# Patient Record
Sex: Female | Born: 1950 | Race: White | Hispanic: No | Marital: Married | Smoking: Former smoker
Health system: Southern US, Community
[De-identification: ages and names within clinical notes are randomized; demographics above are authoritative.]

## PROBLEM LIST (undated history)

## (undated) DIAGNOSIS — R112 Nausea with vomiting, unspecified: Secondary | ICD-10-CM

## (undated) DIAGNOSIS — I639 Cerebral infarction, unspecified: Secondary | ICD-10-CM

## (undated) DIAGNOSIS — T4145XA Adverse effect of unspecified anesthetic, initial encounter: Secondary | ICD-10-CM

## (undated) DIAGNOSIS — M5030 Other cervical disc degeneration, unspecified cervical region: Secondary | ICD-10-CM

## (undated) DIAGNOSIS — I200 Unstable angina: Secondary | ICD-10-CM

## (undated) DIAGNOSIS — Z9889 Other specified postprocedural states: Secondary | ICD-10-CM

## (undated) DIAGNOSIS — I219 Acute myocardial infarction, unspecified: Secondary | ICD-10-CM

## (undated) DIAGNOSIS — G8929 Other chronic pain: Secondary | ICD-10-CM

## (undated) DIAGNOSIS — E039 Hypothyroidism, unspecified: Secondary | ICD-10-CM

## (undated) DIAGNOSIS — T8859XA Other complications of anesthesia, initial encounter: Secondary | ICD-10-CM

## (undated) HISTORY — DX: Other cervical disc degeneration, unspecified cervical region: M50.30

## (undated) HISTORY — PX: OTHER SURGICAL HISTORY: SHX169

## (undated) HISTORY — DX: Unstable angina: I20.0

## (undated) HISTORY — DX: Hypothyroidism, unspecified: E03.9

## (undated) HISTORY — DX: Other chronic pain: G89.29

## (undated) HISTORY — DX: Cerebral infarction, unspecified: I63.9

## (undated) HISTORY — DX: Acute myocardial infarction, unspecified: I21.9

---

## 1997-08-03 DIAGNOSIS — I639 Cerebral infarction, unspecified: Secondary | ICD-10-CM

## 1997-08-03 HISTORY — DX: Cerebral infarction, unspecified: I63.9

## 1999-05-15 ENCOUNTER — Encounter: Admission: RE | Admit: 1999-05-15 | Discharge: 1999-05-15 | Payer: Self-pay | Admitting: Unknown Physician Specialty

## 2000-06-10 ENCOUNTER — Encounter: Payer: Self-pay | Admitting: Unknown Physician Specialty

## 2000-06-10 ENCOUNTER — Encounter: Admission: RE | Admit: 2000-06-10 | Discharge: 2000-06-10 | Payer: Self-pay | Admitting: Unknown Physician Specialty

## 2001-07-06 ENCOUNTER — Encounter: Payer: Self-pay | Admitting: Unknown Physician Specialty

## 2001-07-06 ENCOUNTER — Encounter: Admission: RE | Admit: 2001-07-06 | Discharge: 2001-07-06 | Payer: Self-pay | Admitting: Unknown Physician Specialty

## 2002-08-15 ENCOUNTER — Encounter: Admission: RE | Admit: 2002-08-15 | Discharge: 2002-08-15 | Payer: Self-pay | Admitting: Unknown Physician Specialty

## 2002-08-15 ENCOUNTER — Encounter: Payer: Self-pay | Admitting: Unknown Physician Specialty

## 2003-09-25 ENCOUNTER — Encounter: Admission: RE | Admit: 2003-09-25 | Discharge: 2003-09-25 | Payer: Self-pay | Admitting: Unknown Physician Specialty

## 2004-09-25 ENCOUNTER — Encounter: Admission: RE | Admit: 2004-09-25 | Discharge: 2004-09-25 | Payer: Self-pay | Admitting: Unknown Physician Specialty

## 2004-10-01 HISTORY — PX: ENDOMETRIAL ABLATION: SHX621

## 2005-08-03 DIAGNOSIS — I219 Acute myocardial infarction, unspecified: Secondary | ICD-10-CM

## 2005-08-03 HISTORY — DX: Acute myocardial infarction, unspecified: I21.9

## 2005-08-24 ENCOUNTER — Ambulatory Visit: Payer: Self-pay | Admitting: Cardiology

## 2005-08-24 ENCOUNTER — Inpatient Hospital Stay (HOSPITAL_COMMUNITY): Admission: EM | Admit: 2005-08-24 | Discharge: 2005-08-27 | Payer: Self-pay | Admitting: Cardiology

## 2005-09-09 ENCOUNTER — Ambulatory Visit: Payer: Self-pay | Admitting: Cardiology

## 2005-09-21 ENCOUNTER — Ambulatory Visit: Payer: Self-pay | Admitting: Internal Medicine

## 2005-09-21 ENCOUNTER — Inpatient Hospital Stay (HOSPITAL_COMMUNITY): Admission: EM | Admit: 2005-09-21 | Discharge: 2005-09-23 | Payer: Self-pay | Admitting: Emergency Medicine

## 2005-09-30 ENCOUNTER — Encounter: Admission: RE | Admit: 2005-09-30 | Discharge: 2005-09-30 | Payer: Self-pay | Admitting: Unknown Physician Specialty

## 2005-10-08 ENCOUNTER — Ambulatory Visit: Payer: Self-pay | Admitting: Cardiology

## 2006-02-18 ENCOUNTER — Ambulatory Visit: Payer: Self-pay | Admitting: Cardiology

## 2006-08-26 ENCOUNTER — Ambulatory Visit: Payer: Self-pay | Admitting: Cardiology

## 2006-09-27 ENCOUNTER — Inpatient Hospital Stay (HOSPITAL_COMMUNITY): Admission: EM | Admit: 2006-09-27 | Discharge: 2006-09-27 | Payer: Self-pay | Admitting: Emergency Medicine

## 2006-09-27 ENCOUNTER — Ambulatory Visit: Payer: Self-pay | Admitting: Internal Medicine

## 2006-10-01 ENCOUNTER — Encounter: Admission: RE | Admit: 2006-10-01 | Discharge: 2006-10-01 | Payer: Self-pay | Admitting: Unknown Physician Specialty

## 2006-10-11 ENCOUNTER — Ambulatory Visit: Payer: Self-pay | Admitting: Cardiology

## 2007-04-27 ENCOUNTER — Ambulatory Visit: Payer: Self-pay | Admitting: Cardiology

## 2007-06-23 ENCOUNTER — Ambulatory Visit: Payer: Self-pay | Admitting: Cardiology

## 2007-08-16 ENCOUNTER — Ambulatory Visit: Payer: Self-pay | Admitting: Cardiology

## 2007-08-19 ENCOUNTER — Ambulatory Visit: Payer: Self-pay | Admitting: Internal Medicine

## 2007-09-21 ENCOUNTER — Ambulatory Visit: Payer: Self-pay | Admitting: Cardiology

## 2007-10-07 ENCOUNTER — Encounter: Admission: RE | Admit: 2007-10-07 | Discharge: 2007-10-07 | Payer: Self-pay | Admitting: Unknown Physician Specialty

## 2007-10-20 ENCOUNTER — Encounter: Admission: RE | Admit: 2007-10-20 | Discharge: 2007-10-20 | Payer: Self-pay | Admitting: Unknown Physician Specialty

## 2007-12-21 ENCOUNTER — Ambulatory Visit: Payer: Self-pay | Admitting: Internal Medicine

## 2008-02-08 ENCOUNTER — Ambulatory Visit: Payer: Self-pay | Admitting: Cardiology

## 2008-04-25 ENCOUNTER — Ambulatory Visit: Payer: Self-pay | Admitting: Cardiology

## 2008-08-07 ENCOUNTER — Ambulatory Visit: Payer: Self-pay | Admitting: Cardiology

## 2008-08-29 ENCOUNTER — Ambulatory Visit: Payer: Self-pay | Admitting: Cardiology

## 2008-10-08 ENCOUNTER — Encounter: Admission: RE | Admit: 2008-10-08 | Discharge: 2008-10-08 | Payer: Self-pay | Admitting: Unknown Physician Specialty

## 2008-12-19 ENCOUNTER — Encounter (INDEPENDENT_AMBULATORY_CARE_PROVIDER_SITE_OTHER): Payer: Self-pay | Admitting: *Deleted

## 2009-01-01 DIAGNOSIS — E039 Hypothyroidism, unspecified: Secondary | ICD-10-CM | POA: Insufficient documentation

## 2009-01-01 DIAGNOSIS — I6350 Cerebral infarction due to unspecified occlusion or stenosis of unspecified cerebral artery: Secondary | ICD-10-CM | POA: Insufficient documentation

## 2009-01-02 ENCOUNTER — Ambulatory Visit: Payer: Self-pay | Admitting: Cardiology

## 2009-01-09 ENCOUNTER — Encounter: Payer: Self-pay | Admitting: Cardiology

## 2009-02-22 DIAGNOSIS — I200 Unstable angina: Secondary | ICD-10-CM | POA: Insufficient documentation

## 2009-03-01 ENCOUNTER — Ambulatory Visit: Payer: Self-pay | Admitting: Cardiology

## 2009-04-25 ENCOUNTER — Ambulatory Visit: Payer: Self-pay | Admitting: Cardiology

## 2009-07-05 ENCOUNTER — Encounter (INDEPENDENT_AMBULATORY_CARE_PROVIDER_SITE_OTHER): Payer: Self-pay | Admitting: *Deleted

## 2009-07-16 ENCOUNTER — Telehealth (INDEPENDENT_AMBULATORY_CARE_PROVIDER_SITE_OTHER): Payer: Self-pay | Admitting: *Deleted

## 2009-07-17 ENCOUNTER — Encounter (INDEPENDENT_AMBULATORY_CARE_PROVIDER_SITE_OTHER): Payer: Self-pay | Admitting: *Deleted

## 2009-07-23 ENCOUNTER — Encounter (INDEPENDENT_AMBULATORY_CARE_PROVIDER_SITE_OTHER): Payer: Self-pay | Admitting: *Deleted

## 2009-09-10 ENCOUNTER — Ambulatory Visit: Payer: Self-pay | Admitting: Cardiology

## 2009-09-10 ENCOUNTER — Telehealth: Payer: Self-pay | Admitting: Cardiology

## 2009-09-10 DIAGNOSIS — E785 Hyperlipidemia, unspecified: Secondary | ICD-10-CM | POA: Insufficient documentation

## 2009-09-10 DIAGNOSIS — I2510 Atherosclerotic heart disease of native coronary artery without angina pectoris: Secondary | ICD-10-CM | POA: Insufficient documentation

## 2009-09-24 ENCOUNTER — Encounter: Payer: Self-pay | Admitting: Cardiology

## 2009-09-26 ENCOUNTER — Telehealth: Payer: Self-pay | Admitting: Cardiology

## 2009-10-10 ENCOUNTER — Telehealth: Payer: Self-pay | Admitting: Cardiology

## 2009-10-22 ENCOUNTER — Encounter: Admission: RE | Admit: 2009-10-22 | Discharge: 2009-10-22 | Payer: Self-pay | Admitting: Unknown Physician Specialty

## 2009-10-29 ENCOUNTER — Encounter: Payer: Self-pay | Admitting: Cardiology

## 2009-12-12 ENCOUNTER — Telehealth: Payer: Self-pay | Admitting: Cardiology

## 2010-01-15 ENCOUNTER — Telehealth: Payer: Self-pay | Admitting: Cardiology

## 2010-01-20 ENCOUNTER — Telehealth: Payer: Self-pay | Admitting: Cardiology

## 2010-04-11 ENCOUNTER — Ambulatory Visit: Payer: Self-pay | Admitting: Cardiology

## 2010-04-16 ENCOUNTER — Encounter: Payer: Self-pay | Admitting: Cardiology

## 2010-04-16 ENCOUNTER — Ambulatory Visit (HOSPITAL_COMMUNITY): Admission: RE | Admit: 2010-04-16 | Discharge: 2010-04-16 | Payer: Self-pay | Admitting: Cardiology

## 2010-04-16 ENCOUNTER — Ambulatory Visit: Payer: Self-pay | Admitting: Cardiovascular Disease

## 2010-08-23 ENCOUNTER — Encounter: Payer: Self-pay | Admitting: Neurology

## 2010-08-24 ENCOUNTER — Encounter: Payer: Self-pay | Admitting: Unknown Physician Specialty

## 2010-08-31 LAB — CONVERTED CEMR LAB
Calcium: 9.2 mg/dL (ref 8.4–10.5)
Chloride: 108 meq/L (ref 96–112)
Creatinine, Ser: 0.7 mg/dL (ref 0.4–1.2)
GFR calc non Af Amer: 91.48 mL/min (ref >60)
HDL: 55.7 mg/dL (ref >39.00)
Total Bilirubin: 0.5 mg/dL (ref 0.3–1.2)
Total CHOL/HDL Ratio: 3
Total Protein: 7.1 g/dL (ref 6.0–8.3)

## 2010-09-02 NOTE — Medication Information (Signed)
Summary: [facility]   Imported By: Earl Many 11/09/2009 10:43:41  _____________________________________________________________________  External Attachment:    Type:   Image     Comment:   External Document

## 2010-09-02 NOTE — Progress Notes (Signed)
Summary: pt needs prior approval called in   Phone Note Refill Request Call back at Home Phone [phone] Call back at husband cell# [phone] Message from:  Patient on [facility]  Refills Requested: Medication #1:  PROTONIX 40 MG TBEC 1 tab once daily pt needs prior approval called to [phone] pt acct# [id number]  Initial call taken by: Omer Jack,  January 20, 2010 10:27 AM  Follow-up for Phone Call        CMA s/w [facility] today and got aproval for pt's Protonix good for 1 yr. Danielle Rankin, CMA  January 20, 2010 11:22 AM

## 2010-09-02 NOTE — Assessment & Plan Note (Signed)
Summary: 6 mo f/u ./cy  Medications Added LEVOTHYROXINE SODIUM 88 MCG TABS (LEVOTHYROXINE SODIUM) 1 tab once daily TRAMADOL HCL 50 MG TABS (TRAMADOL HCL) 1 tab two times a day AMITRIPTYLINE HCL 50 MG TABS (AMITRIPTYLINE HCL) 1 tab at bedtime        Visit Type:  6 mo f/u Primary Provider:  Selinda Flavin  CC:  pt states sometimes when she breaths in her chest feels iritated but states she is on an antibiotic for sinusitis....edema/hands...denies any other complaints today.  History of Present Illness: Mrs. Gondek returns today for followup of her history of coronary artery disease and history of a CVA.  She's having no angina or ischemic symptoms except on rare occasion. She then takes her Ranexa p.r.n. which I've advised against the past.  She recently was at [facility] receiving Botox injections for her residual stroke fracture. She was seen by neurologist and I read the note today. She recommended that she come off of aspirin and take Plavix 75 mg twice a day. I have asked her to stop her aspirin in the past that she stay on it.      Current Medications (verified): 1)  Lyrica 75 Mg Caps (Pregabalin) .Marland Kitchen.. 1 Cap Once Daily 2)  Aggrenox 25-200 Mg Xr12h-Cap (Aspirin-Dipyridamole) .... Two Times A Day 3)  Fluoxetine Hcl 20 Mg Caps (Fluoxetine Hcl) .... Once Daily 4)  Plavix 75 Mg Tabs (Clopidogrel Bisulfate) .... Once Daily 5)  Alprazolam 0.5 Mg Tabs (Alprazolam) .... Once Daily 6)  Miralax  Powd (Polyethylene Glycol 3350) .... Daily 7)  Aspirin 81 Mg Tbec (Aspirin) .... Take One Tablet By Mouth Daily 8)  Zyrtec Hives Relief 10 Mg Tabs (Cetirizine Hcl) .... Once Daily 9)  Benadryl 25 Mg Caps (Diphenhydramine Hcl) .... As Needed 10)  Nitroglycerin 0.4 Mg Subl (Nitroglycerin) .... One Tablet Under Tongue Every 5 Minutes As Needed For Chest Pain---May Repeat Times Three 11)  Ranexa 500 Mg Xr12h-Tab (Ranolazine) .... As Needed 12)  Protonix 40 Mg Tbec (Pantoprazole Sodium) .Marland Kitchen.. 1  Tab Once Daily 13)  Simvastatin 40 Mg Tabs (Simvastatin) .Marland Kitchen.. 1 Once Daily 14)  Levothyroxine Sodium 88 Mcg Tabs (Levothyroxine Sodium) .Marland Kitchen.. 1 Tab Once Daily 15)  Tramadol Hcl 50 Mg Tabs (Tramadol Hcl) .Marland Kitchen.. 1 Tab Two Times A Day 16)  Amitriptyline Hcl 50 Mg Tabs (Amitriptyline Hcl) .Marland Kitchen.. 1 Tab At Bedtime  Allergies: 1)  ! Penicillin 2)  ! Morphine 3)  ! 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 Father is 48 with no CAD.  There is no CAD in her siblings.  Social History: Last updated: 02/28/2009 Disabled  Married  Tobacco Use - No.  Alcohol Use - no Drug Use - no  Risk Factors: Smoking Status: never (February 28, 2009)  Review of Systems       negative for bleeding or melena  Vital Signs:  Patient profile:   60 year old female Height:      62 inches Weight:      147 pounds BMI:     26.98 Pulse rate:   62 / minute Pulse rhythm:   irregular BP sitting:   118 / 66  (right arm) Cuff size:   large  Vitals Entered By: Okey Regal  Denyse Amass, CMA (April 11, 2010 2:38 PM)  Physical Exam  General:  well-developed in no acute distress Head:  left facial droop Eyes:  PERRLA/EOM intact; conjunctiva and lids normal. Neck:  Neck supple, no JVD. No masses, thyromegaly or abnormal cervical nodes. Chest Quinnten Calvin:  no deformities or breast masses noted Lungs:  Clear bilaterally to auscultation and percussion. Heart:  PMI nondisplaced regular rate and rhythm normal S1-S2, no carotid bruits Msk:  decreased ROM Pulses:  pulses normal in all 4 extremities Extremities:  No clubbing or cyanosis. Neurologic:  Baseline, alert and oriented x3 Skin:  Intact  without lesions or rashes. Psych:  Normal affect.   New Orders:     1)  Echocardiogram (Echo)   Impression & Recommendations:  Problem # 1:  CAD, NATIVE VESSEL (ICD-414.01) Assessment Unchanged I have asked to discontinue the aspirin. She'll get 50 mg of aspirin to Aggrenox I think is a better drug for neurology for stroke prevention. She'll continue with Plavix 75 mg a day. I'm not aware of 75 mg twice a day as being any more efficacious. I have discussed this with Dr. Excell Seltzer who also agrees with this plan. The following medications were removed from the medication list:    Aspirin 81 Mg Tbec (Aspirin) .Marland Kitchen... Take one tablet by mouth daily Her updated medication list for this problem includes:    Aggrenox 25-200 Mg Xr12h-cap (Aspirin-dipyridamole) .Marland Kitchen..Marland Kitchen Two times a day    Plavix 75 Mg Tabs (Clopidogrel bisulfate) ..... Once daily    Nitroglycerin 0.4 Mg Subl (Nitroglycerin) ..... One tablet under tongue every 5 minutes as needed for chest pain---may repeat times three    Ranexa 500 Mg Xr12h-tab (Ranolazine) .Marland Kitchen... As needed  Problem # 2:  CVA (ICD-434.91) Assessment: Unchanged I will arrange an echocardiogram to rule out remote possibility of a patent foramen ovale. The following medications were removed from the medication list:    Aspirin 81 Mg Tbec (Aspirin) .Marland Kitchen... Take one tablet by mouth daily Her updated medication list for this problem includes:    Aggrenox 25-200 Mg Xr12h-cap (Aspirin-dipyridamole) .Marland Kitchen..Marland Kitchen Two times a day    Plavix 75 Mg Tabs (Clopidogrel bisulfate) ..... Once daily  Orders: Echocardiogram (Echo)  Problem # 3:  HYPERLIPIDEMIA-MIXED (ICD-272.4)  Her updated medication list for this problem includes:    Simvastatin 40 Mg Tabs (Simvastatin) .Marland Kitchen... 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 Take one tablet by mouth daily Her updated medication list for this problem includes:    Aggrenox 25-200 Mg Xr12h-cap (Aspirin-dipyridamole) .Marland Kitchen..Marland Kitchen Two times a day    Plavix 75 Mg Tabs (Clopidogrel bisulfate) ..... Once daily    Nitroglycerin 0.4 Mg Subl (Nitroglycerin) ..... One tablet under tongue every 5 minutes as needed for chest pain---may repeat times three    Ranexa 500 Mg Xr12h-tab (Ranolazine) .Marland Kitchen... As needed  Clinical Reports Reviewed:  Cardiac Cath:  09/27/2006: Cardiac Cath Findings:  IMPRESSION/RECOMMENDATIONS:  She has no significant coronary stenoses.  Specifically, the LAD has recanalized.  Her EF is improved.  I suspect a  noncardiac etiology to her chest discomfort.  Further evaluation if it  recurs.  08/25/2005: Cardiac Cath Findings:  The overall Ephriam Turman motion was good with an estimated ejection  fraction of 60%.   Following attempt at PCA  there was persistent 99% stenosis in the distal LAD  with TIMI-I flow.   CONCLUSION:  1.  Coronary artery disease, status post recent non-ST-elevation myocardial      infarction with 99% stenosis in the distal left anterior descending with      TIMI-I flow, no significant obstruction in the circumflex and right      coronary arteries, and inferoapical Adrain Nesbit akinesis.  2.  Unsuccessful percutaneous coronary intervention of the lesion in the      distal left anterior descending due to inability to cross with the wire.   DISPOSITION:  The patient returned to the postanesthesia care unit for  further observation. I think her outlook should be good on medical therapy.  She has no significant residual coronary disease and overall LV function is  good.         ______________________________  Charlies Constable, M.D. [facility]   Patient Instructions: 1)  Your physician recommends that you schedule a follow-up appointment in: 6 months with Dr. Daleen Squibb 2)  Your physician has  recommended you make the following change in your medication: STOP Aspirin.   Decrease Simvastatin to 1/2 tablet while taking the z-pack.  Appended Document: 6 mo f/u ./cy    Clinical Lists Changes  Observations: Added new observation of PI CARDIO: Your physician has requested that you have an echocardiogram.  Echocardiography is a painless test that uses sound waves to create images of your heart. It provides your doctor with information about the size and shape of your heart and how well your heart's chambers and valves are working.  This procedure takes approximately one hour. There are no restrictions for this procedure. PT WOULD LIKE TO HAVE THIS DONE IN [place] (04/11/2010 17:50)       Patient Instructions: 1)  Your physician has requested that you have an echocardiogram.  Echocardiography is a painless test that uses sound waves to create images of your heart. It provides your doctor with information about the size and shape of your heart and how well your heart's chambers and valves are working.  This procedure takes approximately one hour. There are no restrictions for this procedure. PT WOULD LIKE TO HAVE THIS DONE IN [place]

## 2010-09-02 NOTE — Progress Notes (Signed)
Summary: med question   Phone Note Call from Patient Call back at Home Phone [phone]   Caller: Patient Reason for Call: Talk to Nurse Summary of Call: request to speak to nurse about meds (the study she is in, is changing some meds) Initial call taken by: Migdalia Dk,  October 10, 2009 8:44 AM  Follow-up for Phone Call        SPOKE WITH PT  MD WITH STUDY WANTS PT  TO GET 2ND OPINION RE PT MAYBE ON TO MANY BLOOD THINNERS  HAS APPT WITH DR Cherre Blanc. END ON MARCH  JUST WANTING TO LET YOU KNOW. Follow-up by: Scherrie Bateman, LPN,  October 10, 2009 8:53 AM  Additional Follow-up for Phone Call Additional follow up Details #1::        My humble opinion would be to discontinue Aspirin, continue Plavix and Aggrenox. Additional Follow-up by: Gaylord Shih, MD, [facility],  October 10, 2009 8:59 AM     Appended Document: med question PT AWARE./CY

## 2010-09-02 NOTE — Letter (Signed)
Summary: [facility] Medical Office Note  [facility] Medical Office Note   Imported By: Roderic Ovens 10/11/2009 16:22:42  _____________________________________________________________________  External Attachment:    Type:   Image     Comment:   External Document

## 2010-09-02 NOTE — Progress Notes (Signed)
Summary: talk to nurse   Phone Note Call from Patient Call back at Home Phone [phone]   Caller: Patient Reason for Call: Talk to Nurse Summary of Call: pt needs to talk to nurse about her medications.  Initial call taken by: Edman Circle,  Dec 12, 2009 1:15 PM  Follow-up for Phone Call        PER PT IS STILL WAITING ON RENEXA.PT AWARE WILL CALL IN SCRIPT.  Follow-up by: Scherrie Bateman, LPN,  Dec 12, 2009 5:09 PM    Prescriptions: RANEXA 500 MG XR12H-TAB (RANOLAZINE) as needed  #180 x 3   Entered by:   Scherrie Bateman, LPN   Authorized by:   Gaylord Shih, MD, [facility]   Signed by:   Scherrie Bateman, LPN on 38/75/6433   Method used:   Telephoned to ...       [facility]             , [place]         Ph:        Fax: [phone]   RxID:   [id number]

## 2010-09-02 NOTE — Letter (Signed)
Summary: [facility] Medical Office Note  [facility] Medical Office Note   Imported By: Roderic Ovens 11/06/2009 13:41:43  _____________________________________________________________________  External Attachment:    Type:   Image     Comment:   External Document

## 2010-09-02 NOTE — Progress Notes (Signed)
Summary: pharm/med question   Phone Note From Pharmacy Call back at [phone]   Caller: [facility]  [address] Summary of Call: RANEXA 500 MG XR12H-TAB (RANOLAZINE) 500mg  only comes in 60tab, has to be dispensed in orginal container, prescription stated 30 Initial call taken by: Migdalia Dk,  September 10, 2009 4:53 PM  Follow-up for Phone Call        PHARMACISTS NOTIFIED  TO FILL 60 TABS OF RENEXA Follow-up by: Scherrie Bateman, LPN,  September 10, 2009 5:42 PM

## 2010-09-02 NOTE — Progress Notes (Signed)
Summary: Calling baout prior authoriztion for Ranexa   Phone Note Call from Patient Call back at [facility] Phone [phone]   Caller: Patient Summary of Call: Pt calling about her medication Ranexa and the prior authoriztion Initial call taken by: Judie Grieve,  September 26, 2009 11:48 AM  Follow-up for Phone Call        CMA s/w pt and advised that Scherrie Bateman had sent in prior auth already..Advised TW not in office until Tuesday 3/1.Carma Leaven gave verbal understanding. Danielle Rankin, CMA  September 26, 2009 12:11 PM

## 2010-09-02 NOTE — Assessment & Plan Note (Signed)
Summary: 6 month rov/sl    Visit Type:  6 mo f/u Primary Provider:  Selinda Flavin  CC:  pt states she feels good denies any cp....  History of Present Illness: [name] comes in today for evaluation and management of her coronary disease, history of a myocardial infarction, hyperlipidemia, stable angina, history of CVA.  She's had no angina since I last saw her. She has not had to take any of her next which again she only takes p.r.n. Please see previous notes about this.  She is taking Pepcid now to avoid any interaction with Plavix.  She denies any bleeding, symptoms of TIAs or mini strokes.  Current Medications (verified): 1)  Lyrica 75 Mg Caps (Pregabalin) .Marland Kitchen.. 1 Cap Once Daily 2)  Aggrenox 25-200 Mg Xr12h-Cap (Aspirin-Dipyridamole) .... Two Times A Day 3)  Fluoxetine Hcl 20 Mg Caps (Fluoxetine Hcl) .... Once Daily 4)  Plavix 75 Mg Tabs (Clopidogrel Bisulfate) .... Once Daily 5)  Alprazolam 0.5 Mg Tabs (Alprazolam) .... Once Daily 6)  Miralax  Powd (Polyethylene Glycol 3350) .... Daily 7)  Aspirin 81 Mg Tbec (Aspirin) .... Take One Tablet By Mouth Daily 8)  Zyrtec Hives Relief 10 Mg Tabs (Cetirizine Hcl) .... Once Daily 9)  Benadryl 25 Mg Caps (Diphenhydramine Hcl) .... As Needed 10)  Pepcid Ac 10 Mg Tabs (Famotidine) .... As Needed 11)  Nitroglycerin 0.4 Mg Subl (Nitroglycerin) .... One Tablet Under Tongue Every 5 Minutes As Needed For Chest Pain---May Repeat Times Three 12)  Ranexa 500 Mg Xr12h-Tab (Ranolazine) .... As Needed 13)  Protonix 40 Mg Tbec (Pantoprazole Sodium) .Marland Kitchen.. 1 Tab Once Daily 14)  Simvastatin 40 Mg Tabs (Simvastatin) .Marland Kitchen.. 1 Once Daily  Allergies: 1)  ! Penicillin 2)  ! Morphine 3)  ! 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 Father is 54 with no CAD.  There is no CAD in her siblings.  Social History: Last updated: 03-12-09 Disabled  Married  Tobacco Use - No.  Alcohol Use - no Drug Use - no  Risk Factors: Smoking Status: never (03-12-09)  Review of Systems       negative other than history of present illness  Vital Signs:  Patient profile:   60 year old female Height:      62 inches Weight:      146 pounds BMI:     26.80 Pulse rate:   60 / minute Pulse rhythm:   irregular BP sitting:   118 / 70  (right arm) Cuff size:   large  Vitals Entered By: Danielle Rankin, CMA (September 10, 2009 10:18 AM)  Physical Exam  General:  Well developed, well nourished, in no acute distress. Head:  normocephalic and atraumatic Eyes:  PERRLA/EOM intact; conjunctiva and lids normal. Mouth:  Teeth, gums and palate normal. Oral mucosa normal. Neck:  Neck supple, no JVD. No masses, thyromegaly or abnormal cervical nodes. Lungs:  Clear bilaterally to auscultation and percussion. Heart:  Non-displaced PMI, chest non-tender; regular rate and rhythm, S1, S2 without murmurs, rubs or gallops. Carotid upstroke normal, no bruit. Normal abdominal aortic size, no bruits. Femorals normal pulses, no bruits. Pedals normal pulses. No edema, no varicosities. Msk:  left-sided atrophy from previous stroke with left lower  extremity brace intact Pulses:  pulses normal in all 4 extremities Extremities:  trace left pedal edema and trace right pedal edema.   Neurologic:  Baseline, alert oriented, extremely pleasant and funny Skin:  Intact without lesions or rashes. Psych:  Normal affect.   Problems:  Medical Problems Added: 1)  Dx of Hyperlipidemia-mixed  (ICD-272.4) 2)  Dx of Cad, Native Vessel  (ICD-414.01)  EKG  Procedure date:  09/10/2009  Findings:      normal sinus rhythm, no ST segment changes,  stable  Impression & Recommendations:  Problem # 1:  CAD, NATIVE VESSEL (ICD-414.01) Assessment Unchanged  Her updated medication list for this problem includes:    Aggrenox 25-200 Mg Xr12h-cap (Aspirin-dipyridamole) .Marland Kitchen..Marland Kitchen Two times a day    Plavix 75 Mg Tabs (Clopidogrel bisulfate) ..... Once daily    Aspirin 81 Mg Tbec (Aspirin) .Marland Kitchen... Take one tablet by mouth daily    Nitroglycerin 0.4 Mg Subl (Nitroglycerin) ..... One tablet under tongue every 5 minutes as needed for chest pain---may repeat times three    Ranexa 500 Mg Xr12h-tab (Ranolazine) .Marland Kitchen... 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 Once daily    Aspirin 81 Mg Tbec (Aspirin) .Marland Kitchen... Take one tablet by mouth daily    Nitroglycerin 0.4 Mg Subl (Nitroglycerin) ..... One tablet under tongue every 5 minutes as needed for chest pain---may repeat times three    Ranexa 500 Mg Xr12h-tab (Ranolazine) .Marland Kitchen... As needed  Problem # 3:  CVA (ICD-434.91) Assessment: Unchanged  Her updated medication list for this problem includes:    Aggrenox 25-200 Mg Xr12h-cap (Aspirin-dipyridamole) .Marland Kitchen..Marland Kitchen Two times a day    Plavix 75 Mg Tabs (Clopidogrel bisulfate) ..... Once daily    Aspirin 81 Mg Tbec (Aspirin) .Marland Kitchen... Take one tablet by mouth daily  Problem # 4:  HYPOTHYROIDISM (ICD-244.9) Assessment: Unchanged  Problem # 5:  HYPERLIPIDEMIA-MIXED (ICD-272.4)  we'll check labs today. She has fasted. She is now on simvastatin and off the IMPROVE IT trial. Her updated medication list for this problem includes:    Simvastatin 40 Mg Tabs (Simvastatin) .Marland Kitchen... 1 once daily  Orders: TLB-Lipid Panel ([id number]-LIPID)  Other Orders: TLB-Hepatic/Liver Function Pnl (80076-HEPATIC)

## 2010-09-02 NOTE — Progress Notes (Signed)
Summary: prior auth done today   Phone Note Call from Patient Call back at Home Phone [phone]   Caller: Patient Reason for Call: Talk to Nurse Summary of Call: request to speak to nurse about prior approval for protonix Initial call taken by: Migdalia Dk,  January 15, 2010 10:51 AM  Follow-up for Phone Call        CMA [facility] for pt stating office closed now since it is 5:15 PM, CMA stated that she will retry calling pt tomorrow between 8:30-9am about rx. Danielle Rankin, CMA  January 15, 2010 5:19 PM  returning call, [phone], Migdalia Dk  January 16, 2010 8:32 AM   Additional Follow-up for Phone Call Additional follow up Details #1::        CMA [facility] for pt c/b 01/16/10 and 01/17/10. Danielle Rankin, CMA  January 17, 2010 10:19 AM

## 2010-09-23 ENCOUNTER — Other Ambulatory Visit: Payer: Self-pay | Admitting: Unknown Physician Specialty

## 2010-09-23 DIAGNOSIS — Z1231 Encounter for screening mammogram for malignant neoplasm of breast: Secondary | ICD-10-CM

## 2010-10-24 ENCOUNTER — Ambulatory Visit
Admission: RE | Admit: 2010-10-24 | Discharge: 2010-10-24 | Disposition: A | Discharge status: Home / Self Care | Payer: MEDICARE | Source: Ambulatory Visit | Attending: *Deleted | Admitting: *Deleted

## 2010-10-24 DIAGNOSIS — Z1231 Encounter for screening mammogram for malignant neoplasm of breast: Secondary | ICD-10-CM

## 2010-12-16 NOTE — Assessment & Plan Note (Signed)
[facility]                            CARDIOLOGY OFFICE NOTE   CLEON, THOMA                       MRN:          [id number]  DATE:06/23/2007                            DOB:          [birth year]    Ms. Caudill comes back today because of some substernal chest pain.  She  has had this mostly at rest.  She really does not have it with activity.   She has had some increased indigestion.   She has been taking p.r.n. Ranexa on her own 500 mg.  This seems to  relieve it after about 30 minutes.  She has no other symptoms.  She does  not take nitroglycerin because it gives her a significant headache.   She has also been taking Pepcid at the same time as the Ranexa.   Her last catheterization showed basically recannulization of her distal  LAD and the vessels looked normal.  She had no other significant  coronary disease.  Her EF was 65% with apical hypokinesia.   MEDICATIONS:  1. She continues on Lyrica 75 mg b.i.d.  2. Aggrenox 25 mg p.o. b.i.d.  3. Fluoxetine 20 mg a day.  4. Plavix 75 mg a day.  5. Alprazolam 0.5 mg daily.  6. IMPROVE-IT study drug.  7. MiraLax 1 daily.  8. Levothyroxine 50 mcg a day.  9. Enteric-coated aspirin 81 mg a day.  10.Zyrtec daily.   EXAM:  Blood pressure 128/82, pulse 58 and regular.  She is delightful as usual.  She is in no acute distress.  HEENT:  Normocephalic, atraumatic.  PERRLA.  Extraocular muscles are  intact.  Sclerae clear.  Facial symmetry is normal.  Carotid upstrokes are equal bilaterally without bruits.  No JVD.  Thyroid is not enlarged.  Trachea is midline.  LUNGS:  Clear.  HEART:  Reveals a regular rate and rhythm.  No murmur, rub, or gallop.  ABDOMEN:  Soft.  Good bowel sounds.  EXTREMITIES:  No cyanosis, clubbing, but she has trace edema on the left  side, none on the right.  Pulses were present.   Her EKG is normal with low voltage.  There have been no changes.   I suspect most of Ms.  Orellana's symptoms are due to gastroesophageal  reflux.  I am really not elated with her taking Ranexa p.r.n., though it  seems to help.   PLAN:  1. Limit the amount of Ranexa she is taking.  2. Omeprazole 20 mg p.o. daily for 30 days.  3. Continue other medications.  4. See me back in January of 2009.     Thomas C. Daleen Squibb, MD, [facility]  Electronically Signed    TCW/MedQ  DD: 06/23/2007  DT: 06/24/2007  Job #: [id number]   cc:   Selinda Flavin

## 2010-12-16 NOTE — Assessment & Plan Note (Signed)
[facility] HEALTHCARE                            CARDIOLOGY OFFICE NOTE   Dawn, Newton                       MRN:          [id number]  DATE:08/16/2007                            DOB:          1950/12/17    [name] returns today for further management of her chest  discomfort.  We felt like this is mostly reflux and asked her to stop  taking her Ranexa p.r.n.  We put her on omeprazole 20 mg a day, which  has helped dramatically.  Her reflux is resolved, as is her chest pain.  She is not taking any Ranexa.   Her biggest complaint is that her insurance does not pay for the  omeprazole.  She has had it renewed and it cost her like 50 bucks.   We called the [facility] in [place] today but they could not give [place] any  enlightenment about what they would pay for.   Her medications are unchanged otherwise.   Her Blood pressure today is 125/81, her pulse is 61 and regular, weight  is 148.  HEENT:  Unchanged.  Carotid upstrokes are equal bilaterally without bruits, no JVD.  Thyroid  is not enlarged.  LUNGS:  Clear.  HEART:  A regular rate and rhythm.  ABDOMEN:  Exam is unchanged.  EXTREMITIES:  No cyanosis, clubbing or edema.  Pulses are intact.   I am delighted [name] is better.  I have asked her to continue with  omeprazole and to check with her insurance company to which proton pump  inhibitor they will pay for.  I think over-the-counter Pepcid is going  to be inutile.  I have given her samples of pantoprazole to last her a  month as well.  I will see her back in 6 months.     Thomas C. Daleen Squibb, MD, [facility]  Electronically Signed    TCW/MedQ  DD: 08/16/2007  DT: 08/17/2007  Job #: [id number]   cc:   Selinda Flavin

## 2010-12-16 NOTE — Assessment & Plan Note (Signed)
[facility] HEALTHCARE                            CARDIOLOGY OFFICE NOTE   PHILIPPA, VESSEY                       MRN:          [id number]  DATE:08/07/2008                            DOB:          [birth year]    Ms. Rohrman comes in today for followup.  She is in her usual cheerful  spirit and positive outlook.  What an inspiration she is!   She is having no angina.  She has occasional reflux.  She still takes  her Ranexa 500 mg p.r.n. for chest pain.  Because of the cough, she will  not take it on a regular basis.   Her current meds are unchanged since her last visit.  Please refer to  the maintenance medication list.  She prefers Protonix though she and  her husband are aware of the potential interaction with Plavix, mainly  because she says her insurance pays for it.  She rarely has to use  nitroglycerin.   PHYSICAL EXAMINATION:  VITAL SIGNS:  Her blood pressure today is 114/60,  her pulse 52 and regular.  Her weight is 150.  HEENT:  Baseline unchanged.  NECK:  Carotid upstrokes were equal bilaterally without bruits, no JVD.  Thyroid is not enlarged.  Trachea is midline.  LUNGS:  Clear to auscultation and percussion.  HEART:  Regular rate and rhythm that is slow.  No murmur.  ABDOMEN:  Soft, good bowel sounds.  EXTREMITIES:  No cyanosis, clubbing, or edema.  Pulses are intact.  NEUROLOGIC:  Baseline.   EKG shows sinus bradycardia with low voltage.  There has been no change.   ASSESSMENT AND PLAN:  Ms. Rasch continues to do well.  I have made no  changes in her medical program.  We will plan on seeing her back again  in 6 months.  She continues to improve with study which should be over  in a couple of years.     Thomas C. Daleen Squibb, MD, [facility]  Electronically Signed    TCW/MedQ  DD: 08/07/2008  DT: 08/08/2008  Job #: [id number]   cc:   Selinda Flavin

## 2010-12-16 NOTE — Assessment & Plan Note (Signed)
[facility] HEALTHCARE                            CARDIOLOGY OFFICE NOTE   [name], [name]                       MRN:          [id number]  DATE:02/08/2008                            DOB:          1950-11-11    [name] returns today for further management of her coronary artery  disease.  She has had only one episode requiring two nitroglycerin since  we last saw her in January for chest discomfort.  She takes her Ranexa  on a p.r.n. basis!  She says it helps within 20 minutes of taking the  drug.   She is on the same medication she was on at last visit.  Please refer to  the medication list.   PHYSICAL EXAMINATION:  VITALS:  Her blood pressure today is 118/83.  Her  pulse is 64 and regular.  Weight is 150.  HEENT:  Unchanged.  Carotids are full without bruits.  Thyroid is not  enlarged.  Trachea is midline.  LUNGS:  Clear.  HEART:  Regular rate and rhythm.  PMI is nondisplaced.  ABDOMEN:  Soft with good bowel sounds.  EXTREMITIES:  No Cyanosis, clubbing  or edema.  Pulses are intact.  NEURO:  Intact.   I did not repeat an EKG on [name] today.  She seems to be doing  relatively well.   I have asked her to continue her current medical program, but to take  the Ranexa on a regular basis.  She will continue to carry the  nitroglycerin.  She knows how to activate 911.  I will see her back  again in 6 months.      Thomas C. Wall, MD, [facility]  Electronically Signed     Jesse Sans. Daleen Squibb, MD, [facility]  Electronically Signed   TCW/MedQ  DD: 02/08/2008  DT: 02/09/2008  Job #: [id number]

## 2010-12-19 NOTE — Cardiovascular Report (Signed)
NAMEARIANI, [name] NO.:  [id number]   MEDICAL RECORD NO.:  [id number]          PATIENT TYPE:  INP   LOCATION:  6531                         FACILITY:  [facility]   PHYSICIAN:  Salvadore Farber, MD  DATE OF BIRTH:  November 30, 1950   DATE OF PROCEDURE:  09/27/2006  DATE OF DISCHARGE:                            CARDIAC CATHETERIZATION   PROCEDURE:  Left heart catheterization, left ventriculography, coronary  angiography.   INDICATIONS:  [name] is a 60 year old woman who suffered non-ST  elevation myocardial infarction in January 2007.  She had a 99% stenosis  of the distal LAD.  Dr. Juanda Chance was unable to cross this with a wire.  She left the lab with the vessel occluded.  She represented in February  2007 with recurrent chest pain and ruled out for myocardial infarction.  She had done well for the subsequent year.  She is now readmitted with a  single episode of chest discomfort which was much milder than that which  accompanied her myocardial infarction but was otherwise similar.  She is  ruled out for myocardial infarction by serial enzymes,  electrocardiograms.  She is referred for diagnostic angiography.   PROCEDURE/TECHNIQUE:  Informed consent was obtained.  Under 1% lidocaine  local anesthesia, a 5-French sheath was placed in the right common  femoral artery using modified Seldinger technique and a Smart needle.  Coronary angiography and left heart catheterization were performed using  JL-4, JR-4, and pigtail catheters.  The patient tolerated the procedure  well and was transferred to holding room in stable condition.  Sheath  will be removed there.   COMPLICATIONS:  None.   FINDINGS:  1. LV:  135/17/20.  EF 65% with minimal apical hypokinesis.  2. No aortic stenosis or mitral regurgitation.  3. Left main:  Angiographically normal.  4. LAD:  Moderate-sized vessel giving rise to a single diagonal.  It      is angiographically normal.  The area that was  previously occluded      has now reestablished antegrade flow.  5. Circumflex:  Moderate-sized vessel giving rise to two marginals.      It is angiographically normal.  6. RCA:  Moderate-sized dominant vessel.  The PDA arises above the      acute margin.  It is angiographically normal.   IMPRESSION/RECOMMENDATIONS:  She has no significant coronary stenoses.  Specifically, the LAD has recanalized.  Her EF is improved.  I suspect a  noncardiac etiology to her chest discomfort.  Further evaluation if it  recurs.      Salvadore Farber, MD  Electronically Signed     WED/MEDQ  D:  09/27/2006  T:  09/27/2006  Job:  [id number]   cc:   Thomas C. Daleen Squibb, MD, [facility]  Selinda Flavin

## 2010-12-19 NOTE — Assessment & Plan Note (Signed)
[facility] HEALTHCARE                              CARDIOLOGY OFFICE NOTE   [name], [name]                       MRN:          [id number]  DATE:02/18/2006                            DOB:          08/10/1950    [name] returns today for further management of non-Q wave infarction and  distal left anterior descending disease.  Renexa 500 twice a day has made a  big difference in her chest discomfort.   She is active riding her bike and is hoping to get her driver's licence for  a 20 mile radius.  Dr. Dimas Aguas has signed some forms and she needs some paper  work from [place] today.  We will provide whatever is necessary.   She is in great spirits and quite humorous, as always.   She is currently on the same cardiac meds as before.  She has had  levothyroxine 50 mcg added by Dr. Dimas Aguas.   PHYSICAL EXAMINATION:  VITAL SIGNS:  Her blood pressure today is 114/79,  pulse 61 and regular, weight 145.  SKIN:  Warm and dry.  NECK:  Carotid upstrokes are equal bilaterally without bruits.  No JVD.  Thyroid not enlarged, trachea midline.  LUNGS:  Clear.  HEART:  Regular rate and rhythm without gallop.  ABDOMEN:  Soft.  EXTREMITIES:  Reveal mild edema.   LABORATORY DATA:  EKG is nearly normalized to sinus bradycardia with no  significant ST-segment changes.   ASSESSMENT/PLAN:  I am delighted how [name] is doing.  I have released  her until January of 2008.  We have provided paper work for her to get her  driver's license.  I think this is quite reasonable.  We will plan on seeing  her back again in the winter.                               Thomas C. Daleen Squibb, MD, [facility]    TCW/MedQ  DD:  02/18/2006  DT:  02/18/2006  Job #:  [id number]   cc:   Selinda Flavin

## 2010-12-19 NOTE — H&P (Signed)
NAME:  [name], [name] NO.:  [id number]   MEDICAL RECORD NO.:  [id number]          PATIENT TYPE:  EMS   LOCATION:  MAJO                         FACILITY:  [facility]   PHYSICIAN:  Pricilla Riffle, M.D.    DATE OF BIRTH:  February 20, 1951   DATE OF ADMISSION:  09/21/2005  DATE OF DISCHARGE:                                HISTORY & PHYSICAL   PRIMARY CARDIOLOGIST:  Maisie Fus C. Wall, M.D.   REASON FOR ADMISSION:  Ms. [name] is a 60 year old female, status post  recent hospitalization here at [facility] with non-ST elevation  myocardial infarction secondary to subtotal occlusion of the apical LAD  which was treated medically, who now returns to the emergency room via EMS  with recurrent chest pain.   The patient was hospitalized here from January 22-25 with non-STE MI (peak  troponin 14) and was found to have severe single vessel disease with 99%  occlusion of a tortuous, apical, LAD.  Dr. Juanda Chance was unable to cross the  lesion with the wire and procedure was thus aborted.  The patient was  continued on medical therapy.  Residual anatomy notable for normal  circumflex and right coronary arteries.  LV function was preserved (EF 60%)  with apical akinesis.   The patient was seen in follow-up by Dr. Daleen Squibb on February 7; she presented  with sinus bradycardia at 50 BPM and blood pressure 110/70, and thus taken  off low dose metoprolol.   The patient had resumed a regular exercise program approximately 2 weeks ago  and had been doing well until this morning.  She had not had any chest pain  until 8:30 a.m. while getting dressed, she developed sharp, 4/10 chest pain  underneath the right breast and radiating to the jaw.  She states that this  was similar, but not as intense, to her recent MI presentation.  She also  had associated diaphoresis and dyspnea, but no nausea/vomiting.   The patient took one nitroglycerin tablet at home and then activated EMS.  She was essentially  pain-free upon their arrival, but reports mild residual  pain (1/10). She has been placed on IV nitroglycerin and heparin and has  been treated with baby aspirin.   Admission electrocardiogram shows sinus bradycardia with persistent  inferolateral T wave inversion, somewhat improved from her recent EKG's.   ALLERGIES:  PENICILLIN.   HOME MEDICATIONS:  1.  Aggrenox 25 b.i.d.  2.  Plavix 75 daily.  3.  Aspirin daily.  4.  Improve-It study drug.  5.  Lyrica 75 b.i.d.  6.  Fluoxetine 20 daily.  7.  Ambien 5 nightly.  8.  Xanax 0.5 p.r.n.   PAST MEDICAL HISTORY:  1.  Coronary artery disease.      1.  As described above.  2.  Status post stroke in 1999.      1.  Residual left hemiparesis/mild dysarthria.  3.  Status post endometrial ablation in March os 2006.  4.  Hyperlipidemia.      1.  Currently enrolled in Improve-It study.   SOCIAL HISTORY:  The patient  lives in [place] with her husband.  She is  disabled and unable to work.  She has no children.  She has never smoked  tobacco on a regular basis and denies alcohol use.   FAMILY HISTORY:  Mother died at age 63, complications from motor vehicle  accident.  Father age 64, alive and well with no known coronary artery  disease.  Siblings have no known coronary artery disease.   REVIEW OF SYSTEMS:  As noted per HPI.  No recent development of exertional  dyspnea, PND, orthopnea, or lower extremity edema.  She has occasional  palpitations with no associated presyncope.  She complains of bilateral  lower extremity weakness with left greater than left.  She has occasional  heartburn symptoms, but no known history of peptic ulcer disease.  Remaining  systems are negative.   PHYSICAL EXAMINATION:  VITAL SIGNS:  Blood pressure 102/62, pulse 56 on  arrival, respirations 20, saturations 97% on room air.  GENERAL:  A 60 year old female in no apparent distress.  HEENT:  Normocephalic and atraumatic.  NECK:  Preserved bilateral carotid  pulses without bruits; no JVD.  LUNGS:  Clear to auscultation in all fields.  HEART:  Regular rate and rhythm (S1 and S2), no murmurs, rubs, or gallops.  ABDOMEN:  Soft, nontender, intact bowel sounds without bruits.  EXTREMITIES:  Palpable bilateral femoral pulses with soft bilateral femoral  bruits; preserved distal pulses with no significant edema.  NEUROLOGY:  Alert and oriented with some mild residual dysarthria.   EKG; sinus bradycardia at 56 BPM with left axis deviation; inferolateral  symmetric T waves which are persistent since January 25.   LABORATORY DATA:  Pending.   IMPRESSION:  1.  Unstable angina pectoris.  2.  Status post recent non-STE myocardial infarction.      1.  Secondary to subtotal apical left anterior descending, failed          percutaneous coronary intervention secondary to tortuous vessel,          treated medically on August 25, 2005.  3.  Normal left ventricular function.  4.  History of bradycardia.      1.  Low dose Lopressor recently discontinued.  5.  Hyperlipidemia.      1.  Enrolled in Improve-It study.  6.  Status post stroke in 1999.      1.  Residual left hemiparesis/mild dysarthria.  7.  Intermittent claudication.  8.  Mild elevated thyroid stimulating hormone.   PLAN:  The patient will be admitted to telemetry for close monitoring and  management of recurrent angina pectoris.  Cardiac markers will be cycled and  the patient will continue on aspirin, Plavix, Aggrenox, and intravenous  nitroglycerin and heparin. We will not resume beta blocker given her recent  and current bradycardia and low normal blood pressure.   In addition to cycling cardiac markers, we will check a fasting lipid  profile in the morning and also repeat a TSH (recently mildly elevated at  6.83).   We will defer to Charlies Constable, M.D. [facility] to review recent angiogram and decision regarding further management options.   Recommendation is also to consider outpatient  follow-up arterial Dopplers to  exclude significant peripheral vascular disease.      Gene Serpe, P.A. [facility]    ______________________________  Pricilla Riffle, M.D.    GS/MEDQ  D:  09/21/2005  T:  09/21/2005  Job:  [id number]   cc:   Selinda Flavin  Fax: [phone]

## 2010-12-19 NOTE — H&P (Signed)
NAMECELINDA, DETHLEFS NO.:  [id number]   MEDICAL RECORD NO.:  [id number]          PATIENT TYPE:  EMS   LOCATION:  MAJO                         FACILITY:  [facility]   PHYSICIAN:  Bevelyn Buckles. Bensimhon, MDDATE OF BIRTH:  March 17, 1951   DATE OF ADMISSION:  09/27/2006  DATE OF DISCHARGE:                              HISTORY & PHYSICAL   PRIMARY CARE PHYSICIAN:  Dr. [name].   CARDIOLOGIST:  Dr. Juanito Doom.   REASON FOR ADMISSION:  Chest pain concerning for unstable angina.   HISTORY OF PRESENT ILLNESS:  [name] is a very pleasant 60 year old  woman with a history of hyperlipidemia and previous CVA with left-sided  weakness as well as coronary artery disease.  She is status post non-ST-  elevation myocardial infarction in January 2007.  At that time, cardiac  catheterization showed a 99% apical LAD lesion; otherwise, her  coronaries were normal and she had a normal ejection fraction.  Attempts  were made to cross the lesion with a wire, but this was unable to be  performed and she was thus treated medically.  She was readmitted about  a month later with recurrent chest pain.  She was admitted and ruled out  for a myocardial infarction.  She has been doing quite well since that  time.  She has been maintained on Ranexa without any real significant  chest pain.  She was very busy today and even rode her bike without any  problems.  However, tonight while she was reading the Bible, she  developed acute onset of chest pain radiating to her jaw and right  shoulder.  There was no nausea or vomiting and minimal shortness of  breath.  She took 3 nitroglycerins, but the pain persisted; she called  911 and came to the ER.  On arrival to the ER, she was still having mild  pain; she was started on IV nitroglycerin, given some aspirin and the  pain has essentially resolved, except for some residual discomfort in  her jaw.  EKG shows no significant ST-T wave changes.  Her first  set of  cardiac markers are normal.  She has been started on heparin.   REVIEW OF SYSTEMS:  Negative for fevers, chills, nausea, vomiting,  abdominal pain or reflux.  She does have chronic left-sided weakness,  worse in her arm than her leg, but she does manage to get around and  even exercise.  She has some mild lower extremity swelling, left greater  than right, but no orthopnea or PND.  The remainder of the review of  systems is negative except for HPI and problem list.   PAST MEDICAL HISTORY:  1. Coronary artery disease, status post non-ST-elevation MI with 99%      apical LAD lesion, as described above.  2. History of CVA in 1999 with left-sided weakness, left arm greater      than left leg,  3. Hyperlipidemia.  4. Hypothyroidism.   CURRENT MEDICATIONS:  1. Plavix 75 mg a day.  2. Aggrenox 25 mg b.i.d.  3. Lyrica 75 mg b.i.d.  4. Pravachol  10 mg a day.  5. Fluoxetine 20 mg a day.  6. Alprazolam 0.5 mg p.r.n.  7. Synthroid 50 mcg a day.  8. Ranexa 500 mg b.i.d.   ALLERGIES:  PENICILLIN and MORPHINE; MORPHINE makes her sick.   SOCIAL HISTORY:  She lives in [place] with her husband.  She is  currently on disability.  She does not have any children.  She denies  any tobacco or alcohol use.   FAMILY HISTORY:  Mother died of a motor vehicle accident at age 86.  Father is 59 with no CAD.  There is no CAD in her siblings.   PHYSICAL EXAMINATION:  GENERAL:  She is well-appearing, in no acute  distress.  VITAL SIGNS:  Blood pressure is 110/66.  Heart rate is 58.  She is  saturating 98% on room air.  HEENT:  Sclerae are anicteric.  EOMI.  No xanthelasmas.  She has a left-  sided facial droop.  Her oropharynx is clear.  NECK:  Supple.  JVP is about 6 cmH2O with CV waves.  Carotids are 2+  bilaterally without any bruits.  There is no lymphadenopathy or  thyromegaly.  CARDIAC:  She is bradycardic and regular with no obvious murmur.  LUNGS:  Clear.  ABDOMEN:  Soft, nontender  and non-distended.  No hepatosplenomegaly.  No  bruits.  No masses.  Good bowel sounds.  EXTREMITIES:  Warm with no cyanosis or clubbing.  Good distal pulses.  NEUROLOGIC:  She is alert and oriented x3.  She is significantly weak in  the left upper extremity and cannot raise it all the way.  She has just  mild limitation of strength in her left lower extremity.  Her affect is  pleasant.   LABORATORY AND ACCESSORY CLINICAL DATA:  EKG shows sinus bradycardia at  a rate of 62.  There is mild LVH and some U waves.  There are low  voltages in the anterolateral leads.  No acute ST-T wave changes.   Current labs show a sodium of 140, potassium 4.1, chloride of 109, BUN  of 17, glucose of 96.  Hemoglobin is 13.3, white count 7.0 and platelets  are 305,000.  Point-of-care markers:  CK-MB is less than 1.  Troponin is  less than 0.05.   ASSESSMENT:  1. Chest pain concerning for unstable angina.  2. History of coronary artery disease, status post non-ST-elevation      myocardial infarction in January 2000, as described above.  3. History of cerebrovascular accident with left-sided weakness in      1999.  4. Hyperlipidemia.  5. We will admit her to Telemetry to rule out myocardial infarction.      She will be treated with Aggrenox, Plavix and nitroglycerin.  We      will plan catheterization tomorrow.  We have discussed the risks      and benefits and also the possibility of stress test, but they      prefer to proceed with catheterization.      Bevelyn Buckles. Bensimhon, MD  Electronically Signed     DRB/MEDQ  D:  09/27/2006  T:  09/27/2006  Job:  [id number]   cc:   Dawn Jenny Wall, MD, [facility]

## 2010-12-19 NOTE — Assessment & Plan Note (Signed)
[name]                            CARDIOLOGY OFFICE NOTE   Dawn, Newton                       MRN:          [id number]  DATE:08/26/2006                            DOB:          08/13/1950    Ms. [name] returns today for follow up of her coronary artery disease.  She had a non-Q wave myocardial infarction involving the distal LAD.  She has normal left ventricular systolic function.  She has a history of  bradycardia on a beta blocker which was discontinued.  Ranexa has done a  good job of controlling her angina.   She is in the IMPROVE IT study.   She does have her learner's permit now and is driving some with  assistance.  She is in great spirits and always full of joy.   The IMPROVE IT nurses have seen her today.   MEDICATIONS:  1. Lyrica 75 mg p.o. b.i.d.  2. Aggrenox 25 mg b.i.d.  3. Fluoxetine 20 mg a day.  4. Plavix 75 mg a day.  5. Alprazolam 0.5 mg daily.  6. IMPROVE IT study drug Ranexa 500 mg p.o. b.i.d.  7. MiraLax daily.  8. Levothyroxine 50 mcg a day.  9. aspirin 81 mg a day.   PHYSICAL EXAMINATION:  Her blood pressure is 114/76.  Her pulse is 65  and regular.  EKG shows sinus brady with poor R wave progression across  the anterior precordium.  Her weight is stable at 145.  HEENT:  Normocephalic, atraumatic, PERRL.  Extraocular movements intact.  Sclerae clear.  Facial symmetry is normal.  Carotid upstrokes are equal  bilaterally without bruits.  There is no JVD.  Thyroid is not enlarged.  LUNGS:  Clear.  HEART:  A nondisplaced PMI.  She has a normal S1, S2.  No gallop.  ABDOMINAL:  Soft with good bowel sounds.  EXTREMITIES:  Reveal a brace on the left leg from her residual stroke  effects.  Pulses were decreased, but palpable.  The rest of her exam is  unremarkable.   Ms. [name] is doing well.  She is having no angina.  Her blood pressure  and vitals in general are stable.  She is on the IMPROVE IT study.   She  has a remarkable outlook and is an inspiration to [place] all.   We will plan on seeing her back in a year.     Thomas C. Daleen Squibb, MD, [facility]  Electronically Signed    TCW/MedQ  DD: 08/26/2006  DT: 08/26/2006  Job #: [id number]   cc:   [facility] [place] [facility]  Selinda Flavin

## 2010-12-19 NOTE — Discharge Summary (Signed)
[name], [name] NO.:  [id number]   MEDICAL RECORD NO.:  [id number]          PATIENT TYPE:  INP   LOCATION:  6531                         FACILITY:  [facility]   PHYSICIAN:  Salvadore Farber, MD  DATE OF BIRTH:  17-Jul-1951   DATE OF ADMISSION:  09/27/2006  DATE OF DISCHARGE:  09/28/2006                               DISCHARGE SUMMARY   PROCEDURES:  1. Cardiac catheterization.  2. Coronary arteriogram.  3. Left ventricular gram.   PRIMARY DIAGNOSIS:  Chest pain.   SECONDARY DIAGNOSES:  1. Non ST segment elevation MI in January 2007 (99% apical LAD with      medical therapy recommended).  2. History of CVA with left sided weakness.  3. Allergy or intolerance to penicillin, morphine and Ambien.  4. History of hyperlipidemia with a total cholesterol of 146,      triglycerides 147, HDL 50, LDL 67 this admission.  5. Status post endometrial ablation on March 2006.  6. Status post colonoscopy 2 years ago.  that was reportedly normal.  7. Chronic pain issues including the left side post CVA and right jaw      aching.  8. Hypothyroidism.   TIME AT DISCHARGE:  37 minutes.   HOSPITAL COURSE:  [name] is a 60 year old female with a history of  coronary artery disease diagnosed in 2007.  She had a non ST segment  elevation at that time but the culprit lesion was in 99% apical LAD for  which medical therapy was recommended.  On the day of admission she  developed chest pain at rest that radiated into her jaw and right arm.  The symptoms reminded her of her MI pain.  She took nitroglycerin x3  without relief and came to the emergency room where she was admitted for  further evaluation and treatment.   Her cardiac enzymes were negative for MI.  Cardiac catheterization was  performed to further define her anatomy and it showed no significant  stenosis.  The LAD was recanalized and her EF was 65% with minimal  apical hypokinesis and OAS or MR.  Dr. Samule Ohm  suspected noncardiac  etiology of chest pain.  She had recurrent pain and was given a GI  cocktail and a d-dimer was ordered.   The d-dimer was within normal limits at 0.41.  Her chest pain resolved  with a GI cocktail.  Post cath her groin was without ecchymosis,  hematoma or bruit.  She was having some pain in other areas for which  she was continued on her home medications but her chest pain had  resolved.  Pending completion of bedrest she is tentatively considered  stable for discharge without patient follow up arranged.   DISCHARGE INSTRUCTIONS:  Her activity level is to be increased  gradually.  She is to call our office for any problems with the cath  site.  She is to stick to a low fat diet.  She is to followup with Dr.  Daleen Squibb on October 11, 2006 at 11:45 and with Dr. Dimas Aguas as needed.   DISCHARGE MEDICATIONS:  1. Aspirin  81 mg a day.  2. Plavix 75 mg a day.  3. Aggrenox 25 mg b.i.d.  4. Lyrica 75 mg just prior to admission.  5. Pravachol 10 mg a day.  6. Fluoxetine 20 mg a day.  7. Alprazolam 0.5 mg just prior to admission.  8. Synthroid 50 mcg q. day.  9. Sodium medication just prior to admission.  10.Ramex 500 mg to be stopped.  11.Darvocet as prior to admission.  12.MiraLax as prior to admission.  13.Benadryl as prior to admission.  14.Pepcid 20 mg a day.      [name], PA-C    ______________________________  Salvadore Farber, MD    RB/MEDQ  D:  09/27/2006  T:  09/28/2006  Job:  [id number]   cc:   Selinda Flavin

## 2010-12-19 NOTE — Discharge Summary (Signed)
[name], [name] NO.:  [id number]   MEDICAL RECORD NO.:  [id number]          PATIENT TYPE:  INP   LOCATION:  3706                         FACILITY:  [facility]   PHYSICIAN:  Charlton Haws, M.D.     DATE OF BIRTH:  [birth year]   DATE OF ADMISSION:  09/21/2005  DATE OF DISCHARGE:  09/23/2005                                 DISCHARGE SUMMARY   PRIMARY CARDIOLOGIST:  Dr. Juanito Doom.   PRIMARY CARE DOCTOR:  Dr. Selinda Flavin.   DISCHARGING DIAGNOSES:  1.  Chest pain, negative cardiac enzymes, EKG without new changes, no change      since EKG done in January of this year.  2.  Intolerance to beta blocker secondary to bradycardia.  3.  Intolerance to nitrate secondary to severe headache.   PAST MEDICAL HISTORY:  1.  Coronary artery disease, status post non-Q-wave infarct in January of      2007, status post cardiac catheterization at that time that showed an      apical 95% left anterior descending lesion that could not be opened by      Dr. Juanda Chance.  The patient had apical wall motion abnormality.  Overall      left ventricular function was normal.  2.  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  3.  Hyperlipidemia.  4.  History of endometrial ablation in March of 2006.  5.  Enrolled in the Improve-It Study, looking at Vytorin versus Zocor for      hyperlipidemia.   HOSPITAL COURSE:  Ms. Minar is a 60 year old female followed by Dr. Juanito Doom with recent history of myocardial infarction in January of this year,  at which time she had a cardiac catheterization, results as stated above.  The patient presented to [facility] Emergency Room this admission  complaining of chest discomfort.  The patient states she took some  nitroglycerin secondary to chest discomfort and eventually resolved;  however, she called EMS for further evaluation and the patient was  transported to the hospital.  Initial EKG showed sinus tachycardia at 56.  The patient was admitted, ruled out for a myocardial infarction, developed  severe headache, nausea and vomiting in the setting of IV nitroglycerin  drip, which was discontinued.  The patient's heart rate maintained around  60, in normal sinus rhythm, no further episodes of chest discomfort.  Dr.  Eden Emms in to see patient on day of discharge, patient requesting to be  discharged home, no further episodes of chest discomfort, vital signs are  stable, EKG without new changes, patient being discharged home with the  addition of Ranexa, which has already been initiated here in the hospital,  patient tolerating medication.  She also was instructed to continue her  previous medications including her Plavix, aspirin, Lyrica, Prozac and  Aggrenox.   FOLLOWUP:  She has a followup appointment with Dr. Daleen Squibb, October 08, 2005 at  3:45 p.m.   DURATION OF DISCHARGE ENCOUNTER:  Thirty minutes.      Dorian Pod, NP    ______________________________  Charlton Haws, M.D.    MB/MEDQ  D:  09/23/2005  T:  09/24/2005  Job:  [id number]   cc:   Thomas C. Wall, M.D.  [address]  [address]  [place]  [place] [address]   Selinda Flavin  Fax: [phone]   Charlies Constable, M.D. [facility]  [address]  [address]  [place]  [place] [address]

## 2010-12-19 NOTE — H&P (Signed)
[name], [name]                ACCOUNT NO.:  [id number]   MEDICAL RECORD NO.:  [id number]          PATIENT TYPE:  INP   LOCATION:  6524                         FACILITY:  [facility]   PHYSICIAN:  Jesse Sans. Wall, M.D.   DATE OF BIRTH:  1951/05/18   DATE OF ADMISSION:  08/24/2005  DATE OF DISCHARGE:                                HISTORY & PHYSICAL   CHIEF COMPLAINT:  Chest fullness and tightness around 5:00 this afternoon  when I walked out in the cold air.   HISTORY OF PRESENT ILLNESS:  Ms. [name] is a delightful 60 year old white  female white female who developed the onset of substernal chest discomfort  that radiated to her jaw about 5:00 this afternoon.  It progressively  worsened and she went to [facility] ER around 7 p.m.  It was  relieved there with nitroglycerin and aspirin and subsequent IV heparin.  EKG showed ST segment changes inferolaterally with T-wave inversion  consistent with a non ST segment elevation infarct.  Troponin level was  positive at 1.07, CPK total up at 202, MB pending.   She is currently pain-free and was transported here by [facility] for cardiac  catheterization, diagnostic work-up.   PAST MEDICAL HISTORY:  Remarkably, she has had a stroke in 1999 on Christmas  night with subsequent left hemiparesis.  She was transferred from [place],  [place] to [facility] where she spent 11 days.  Her  recollection is they did not find any obvious source.  She apparently had a  hypercoagulable work-up which was negative.  She had been on Aggrenox since.  She also had a mild elevation in cholesterol and has been on Pravachol only  10 mg a day.   There are no other conventional risk factors.  She does not smoke, does not  drink.  No history of diabetes.  No history of hypertension.  No premature  family history.  She exercises about 2 miles on a regular basis.  She eats  very heart healthy.   She is allergic to  PENICILLIN.   CURRENT MEDICATIONS:  1.  Prozac 20 mg a day.  2.  Aggrenox 25/200 one twice a day.  3.  Pravachol 10 mg a day.  4.  Xanax 0.5 q.h.s. p.r.n. sleep.  5.  Ambien 5 mg p.o. q.h.s. p.r.n. sleep.  6.  Lyrica 150 mg p.o. b.i.d.  7.  Darvocet p.r.n.   SURGERIES:  Endometrial ablation in March of 2006.  She has had a left foot  and ankle repair on several occasions from what sounds like contractures  from her stroke.   FAMILY HISTORY:  Noncontributory.   REVIEW OF SYSTEMS:  Other than frequent urinary tract infections, really  negative.  She was at the eye doctor today hoping to get her driver's  license renewed this summer.  She does have some falls from her hemiparesis.   PHYSICAL EXAMINATION:  GENERAL:  She is extremely delightful.  She is in no  acute distress.  VITAL SIGNS:  Blood pressure 105/55, pulse 60.  She is in sinus rhythm.  Temperature 98.5, respiratory rate 18, O2 saturation is 94%.  HEENT:  Extraocular movements are intact.  PERRLA.  Sclerae clear.  Facial  symmetry is slightly drooped on the left.  Teeth are satisfactory.  Carotid  upstrokes are equal bilaterally without bruits.  There is no JVD.  thyroid  is not enlarged.  LUNGS:  Few crackles in both bases.  CARDIAC:  Nondisplaced PMI.  She has normal S1, S2.  ABDOMEN:  Soft with good bowel sounds.  There is no midline bruit.  There is  no flank bruit.  There is no hepatomegaly.  EXTREMITIES:  Without clubbing, cyanosis, edema.  Femoral pulses are brisk  with no obvious bruit.  Peripheral pulses are brisk.  NEUROLOGIC:  Remarkable for left hemiparesis.   LABORATORY DATA:  Within normal limits other than the cardiac enzymes.   ASSESSMENT:  1.  Inferior lateral non ST segment elevation myocardial infarction,      question etiology.  2.  History of stroke 1999 with left hemiparesis, questionable etiology.      She apparently had a hypercoagulable work-up which was negative.  3.  Mild  hyperlipidemia.   PLAN:  1.  Cardiac enzymes.  2.  TSH, magnesium, homocysteine level.  Consider hematology consult for      rechecking her hypercoagulable profile or obtaining old records.  3.  Fasting lipid profile.  Goal LDL is 60.  Goal HDL is greater than 50.  4.  Continue IV heparin and IV nitroglycerin.  If pain recurs start IV      Integrilin.  5.  Enteric-coated aspirin 81 mg a day.  6.  Plavix load 300 mg now, 300 mg in several hours.  7.  Cardiac catheterization tomorrow.  Indications, risks, potential      benefits have been discussed with the patient or significant other.      They agree to proceed.      Thomas C. Wall, M.D.  Electronically Signed     TCW/MEDQ  D:  08/25/2005  T:  08/25/2005  Job:  [id number]   cc:   Selinda Flavin  Fax: [phone]

## 2010-12-19 NOTE — Assessment & Plan Note (Signed)
[facility]                            CARDIOLOGY OFFICE NOTE   Dawn, Newton                       MRN:          [id number]  DATE:10/11/2006                            DOB:          1951-01-25    [name] returns today after being admitted to the hospital with  chest pain unresponsive to nitroglycerin.  She ruled out for myocardial  infarction.  Remarkably, her cardiac catheterization showed  recannulization of her distal LAD and the vessel looked angiographically  normal.  Her EF was 65% with apical hypokinesia.  Circumflex and right  were also angiographically normal.   Her lipids were at goal.   She has had no further pain since discharge.   Her medicines are unchanged since her last visit.  She is on:  1. Aggrenox 25 b.i.d.  2. Plavix 75 mg a day.  3. IMPROVE IT study drug.  4. Enteric-coated aspirin 81 mg a day.   Her blood pressure today is 120/90, her pulse is 55 and regular.  Her  exam is unchanged.  Catheterization site is stable.   I am delighted that [name] is doing well.  It is nothing short of  miraculous that her LAD is now open.  I have reinforced that she is  doing remarkably well.  I will see her back again in a year.     Thomas C. Daleen Squibb, MD, [facility]  Electronically Signed    TCW/MedQ  DD: 10/11/2006  DT: 10/11/2006  Job #: [id number]   cc:   Selinda Flavin

## 2010-12-19 NOTE — Cardiovascular Report (Signed)
NAME:  [name], [name] NO.:  [id number]   MEDICAL RECORD NO.:  [id number]          PATIENT TYPE:  INP   LOCATION:  6524                         FACILITY:  [facility]   PHYSICIAN:  Charlies Constable, M.D. [facility] DATE OF BIRTH:  1951-04-06   DATE OF PROCEDURE:  08/25/2005  DATE OF DISCHARGE:                              CARDIAC CATHETERIZATION   CLINICAL HISTORY:  Ms. Prout is 60 years old and he has a history of  previous right brain CVA, seen in [facility] emergency room with chest pain  and positive enzymes consistent with a non-ST-elevation infarction. She also  had anterior T-wave changes. She also had anterior T-wave changes. She was  transferred to [place], was seen by Dr. Daleen Squibb, and was scheduled for evaluation  with angiography.   PROCEDURE NOTE:  The procedure was performed via the femoral artery using  arterial sheath and a 6-French preformed coronary catheter. A front wall  arterial puncture was performed with Omnipaque contrast used. After  completion of the diagnostic study, we considered PCI of the distal LAD. The  vessel appeared somewhat small, but wall motion abnormality on left  ventriculography, moderate in size, and she had recurrent pain this morning,  so we decided to make an attempt at crossing with a wire. Also a number of  __________ lesion which made the PCI potentially difficult.   We used a FL3 6-French guiding catheter and initially tried to cross with a  PT2 wire. We were unable to cross with a wire due to multiple bends and  difficulty controlling the wire at the tip. We then used a long Whisper wire  and was still unable to cross the lesion. We used a 1.5 x 15-mm balloon  catheter for support, but still unable to steer the catheter wire across the  lesion. We did not pursue attempts to cross the lesion for very long because  the lesion appeared to be fairly distal and because there was some risk in  trying to cross the totally occluded blockage with  Integrilin on board. She  had been started on Integrilin in the emergency department  and we gave  additional heparin to perform an ACT greater than 200 seconds and she had  also been previously loaded with Plavix.   The patient did tolerate the procedure well and left the laboratory in  satisfactory condition. We closed the right femoral artery with AngioSeal at  the end of the procedure.   RESULTS:   HEMODYNAMICS:  1.  Left main coronary artery: The left main coronary artery is free of      significant disease.  2.  Left anterior descending artery: The left anterior descending artery      gave rise to two diagonal branches and a septal  perforator. The LAD was      quite tortuous in its mid portion and then had a 99% occlusion in its      distal portion with TIMI-I flow.  3.  Circumflex artery: The circumflex artery gave rise to an atrial branch,      marginal branch, and two posterolateral  branches. These vessels were      free of significant disease.  4.  Right coronary artery: The right coronary artery is a moderate size      vessel which gave rise to a clonus branch, right ventricular branch,      posterior descending branch, and a posterolateral branch. This vessel      was free of significant disease.   LEFT VENTRICULOGRAM:  The left ventriculogram was obtained in the RAO  projection showed akinesis of the tip of the apex in the inferoapical  segment.  The overall wall motion was good with an estimated ejection  fraction of 60%.   Following attempt at PCA there was persistent 99% stenosis in the distal LAD  with TIMI-I flow.   CONCLUSION:  1.  Coronary artery disease, status post recent non-ST-elevation myocardial      infarction with 99% stenosis in the distal left anterior descending with      TIMI-I flow, no significant obstruction in the circumflex and right      coronary arteries, and inferoapical wall akinesis.  2.  Unsuccessful percutaneous coronary intervention  of the lesion in the      distal left anterior descending due to inability to cross with the wire.   DISPOSITION:  The patient returned to the postanesthesia care unit for  further observation. I think her outlook should be good on medical therapy.  She has no significant residual coronary disease and overall LV function is  good.           ______________________________  Charlies Constable, M.D. [facility]     BB/MEDQ  D:  08/25/2005  T:  08/25/2005  Job:  [id number]   cc:   Selinda Flavin  Fax: [phone]   Jesse Sans. Wall, M.D.  [address]  [address]  [place]  [place] [address]

## 2010-12-19 NOTE — Discharge Summary (Signed)
NAMEBRITTAIN, SMITHEY NO.:  [id number]   MEDICAL RECORD NO.:  [id number]          PATIENT TYPE:  INP   LOCATION:  6524                         FACILITY[facility]  [facility]   PHYSICIAN:  Charlies Constable, M.D. [facility] DATE OF BIRTH:  [birth year]   DATE OF ADMISSION:  08/24/2005  DATE OF DISCHARGE:  08/27/2005                                 DISCHARGE SUMMARY   PRINCIPAL DIAGNOSIS:  Non-ST elevation myocardial infarction.   OTHER DIAGNOSES:  1.  Coronary artery disease.  2.  History of a cerebrovascular accident in 1999 with residual left      hemiparesis.  3.  Hyperlipidemia.  4.  History of endometrial ablation in March of 2006.   ALLERGIES:  PENICILLIN.   PROCEDURES:  Left heart cardiac catheterization.   PRIMARY CARE PHYSICIAN:  Dr. Dimas Aguas in [place], [place].   PRIMARY CARDIOLOGIST:  Dr. Valera Castle in [place].   HISTORY OF PRESENT ILLNESS:  A 60 year old white female with a prior history  of CVA, who was in her usual state of health until August 24, 2005 when at  approximately 5 p.m. she began to experience substernal chest discomfort  with radiation to the jaw and presented to [facility] at  approximately 7 p.m.  The discomfort was relieved with nitroglycerin,  aspirin and an IV heparin.  EKG showed inferolateral S-T segment changes and  T-wave inversion consistent with non-ST elevation MI.  The troponin was  elevated at 1.07.  The decision was made to transfer to [facility] for  further evaluation.   HOSPITAL COURSE:  She eventually peaked her CK at 652, MB at 87.5 and  troponin-I at 13.88.  She underwent left heart cardiac catheterization on  January 23, which showed a 99% stenosis of the apical LAD.  This was a  tortuous vessel.  The EF was 60% with apical akinesis.  An attempt was made  to wire the distal LAD.  However, the attempts were unsuccessful.  The  decision was to abort the procedure and pursue medical therapy.  Following  her  procedure, she did have a small hematoma of the right groin requiring  additional manual pressure application.  This was successful with the  resolution of the hematoma and no additional groin complications.  She was  able to ambulate January 24 without recurrent chest discomfort or groin  complications and this morning is being discharged home in satisfactory  condition.   During her admission, she was enrolled in the IMPROVE-IT study looking at  Vytorin versus Zocor with the lowering therapy.   Also during this admission, she was noted to have a slightly elevated TSH of  6.825.  T4 is not immediately available on discharge.  We would recommend  follow-up TFTs as an outpatient with Dr. Dimas Aguas, as we may just being seeing  a sick euthyroid.   DISCHARGE LABORATORY:  Hemoglobin 12.2, hematocrit 34.9, WBC 8.9, platelets  294, sodium 139, potassium 3.4, chloride 107, CO2 27, BUN 8, creatinine 0.8,  glucose 94, total bilirubin 0.4, alkaline phosphatase 51, AST 64, ALT 19,  total protein 6.3, albumin  3.2, calcium 8.9, peak CK 652, peak MB 87.5, peak  troponin-I 13.88, total cholesterol 180, triglycerides 37, HDL 55, LDL 118,  TSH 6.825, homocysteine 11.5.   DISPOSITION:  The patient is being discharged home today in good condition.   FOLLOW-UP PLANS AND APPOINTMENTS:  She is asked to follow up with her  primary care physician, Dr. Dimas Aguas, in one week and she has a follow-up  appointment with Dr. Valera Castle at [facility] in [place] on  February 7 at 9:15 a.m.   DISCHARGE MEDICATIONS:  1.  Aspirin 81 mg daily.  2.  Aggrenox 25/200 mg b.i.d.  3.  Plavix 75 mg daily.  4.  IMPROVE-IT study drug daily.  5.  Lopressor 12.5 mg b.i.d.  6.  Lyrica 150 mg b.i.d.  7.  Xanax 0.5 mg q.h.s.  8.  Prozac 10 mg daily.  9.  Nitroglycerin 0.4 mg sublingual p.r.n. chest pain.   OUTSTANDING LABORATORY STUDIES:  None.   DURATION OF DISCHARGE ENCOUNTER:  Forty minutes including physician  time.      Ok Anis, NP    ______________________________  Charlies Constable, M.D. [facility]    CRB/MEDQ  D:  08/27/2005  T:  08/27/2005  Job:  [id number]   cc:   Thomas C. Wall, M.D.  [address]  [address]  [place]  [place] [address]   Dr. Carlynn Spry, [place]

## 2010-12-23 ENCOUNTER — Encounter: Payer: Self-pay | Admitting: Cardiology

## 2010-12-26 ENCOUNTER — Ambulatory Visit (INDEPENDENT_AMBULATORY_CARE_PROVIDER_SITE_OTHER): Payer: Medicare Other | Admitting: Cardiology

## 2010-12-26 ENCOUNTER — Encounter: Payer: Self-pay | Admitting: Cardiology

## 2010-12-26 VITALS — BP 112/62 | HR 72 | Resp 18 | Ht 62.0 in | Wt 143.1 lb

## 2010-12-26 DIAGNOSIS — I2510 Atherosclerotic heart disease of native coronary artery without angina pectoris: Secondary | ICD-10-CM

## 2010-12-26 MED ORDER — CLOPIDOGREL BISULFATE 75 MG PO TABS: 75.0000 mg | ORAL_TABLET | Freq: Every day | ORAL | Status: AC

## 2010-12-26 NOTE — Patient Instructions (Signed)
Your physician recommends that you schedule a follow-up appointment in: 6 months with DR. Wall

## 2010-12-26 NOTE — Progress Notes (Signed)
   Patient ID: [name], female    DOB: 1951/03/19, 60 y.o.   MRN: [id number]  HPI [name] comes in for E and M of her CAD. She has no angina or chest pain. She has walked in several fund raisers, as much as 2 mile with her cane.  She wants to go on generic Plavix.  EKG today is normal.    Review of Systems  All other systems reviewed and are negative.      Physical Exam  Nursing note and vitals reviewed. Constitutional: She is oriented to person, place, and time. She appears well-developed and well-nourished. No distress.  HENT:  Head: Normocephalic and atraumatic.  Eyes: EOM are normal. Pupils are equal, round, and reactive to light.  Neck: Neck supple. No JVD present. No tracheal deviation present. No thyromegaly present.  Cardiovascular: Normal rate, regular rhythm, normal heart sounds and intact distal pulses.   No murmur heard.      No bruits  Pulmonary/Chest: Effort normal and breath sounds normal.  Abdominal: Bowel sounds are normal.  Musculoskeletal: She exhibits no edema.  Neurological: She is alert and oriented to person, place, and time.       Baseline deficits  Skin: Skin is warm and dry.  Psychiatric: She has a normal mood and affect.

## 2010-12-26 NOTE — Assessment & Plan Note (Signed)
Stable, no change in treatment.

## 2011-04-25 DIAGNOSIS — R059 Cough, unspecified: Secondary | ICD-10-CM | POA: Insufficient documentation

## 2011-06-30 ENCOUNTER — Encounter: Payer: Self-pay | Admitting: Cardiology

## 2011-06-30 ENCOUNTER — Ambulatory Visit (INDEPENDENT_AMBULATORY_CARE_PROVIDER_SITE_OTHER): Payer: Medicare Other | Admitting: Cardiology

## 2011-06-30 VITALS — BP 118/70 | HR 67 | Ht 62.0 in | Wt 150.0 lb

## 2011-06-30 DIAGNOSIS — I2510 Atherosclerotic heart disease of native coronary artery without angina pectoris: Secondary | ICD-10-CM

## 2011-06-30 DIAGNOSIS — I6350 Cerebral infarction due to unspecified occlusion or stenosis of unspecified cerebral artery: Secondary | ICD-10-CM

## 2011-06-30 DIAGNOSIS — E785 Hyperlipidemia, unspecified: Secondary | ICD-10-CM

## 2011-06-30 NOTE — Assessment & Plan Note (Signed)
Stable. Continue aggressive secondary preventive therapy. 

## 2011-06-30 NOTE — Patient Instructions (Signed)
Your physician wants you to follow-up in: 1 year with Dr. Wall. You will receive a reminder letter in the mail two months in advance. If you don't receive a letter, please call our office to schedule the follow-up appointment.  

## 2011-06-30 NOTE — Assessment & Plan Note (Signed)
Continue simvastatin, followup blood work with Dr. Dimas Aguas

## 2011-06-30 NOTE — Progress Notes (Signed)
HPI  [name] returns today for the evaluation and management of her history of coronary disease, myocardial infarction, history of CVA.  She had episodes of aching in her upper chest after overdoing a meal preparation back in August. She was admitted to [facility] and ruled out for MI.  His also been having significant headaches over her right time that radiates into her jaw and down into her chest. Recent sedimentation rate was normal as was her other blood work that she brought from her primary care.  She denies exertional chest tightness or angina. In fact, she walked on treadmill one mile the other day despite her difficulty walking after her stroke.  She is compliant with her medications. He still only takes Ranexa on a p.r.n. Basis however.  Past Medical History  Diagnosis Date  . Unstable angina   . Hypothyroidism   . CVA (cerebral vascular accident)   . AMI (acute myocardial infarction)     Current Outpatient Prescriptions  Medication Sig Dispense Refill  . ALPRAZolam (XANAX) 0.5 MG tablet Take 0.5 mg by mouth 2 (two) times daily as needed.        Marland Kitchen amitriptyline (ELAVIL) 50 MG tablet Take 25 mg by mouth at bedtime.       . cetirizine (ZYRTEC) 10 MG tablet Take 10 mg by mouth daily.        . clopidogrel (PLAVIX) 75 MG tablet Take 1 tablet (75 mg total) by mouth daily.  90 tablet  3  . diphenhydrAMINE (BENADRYL) 25 MG tablet Take 25 mg by mouth every 6 (six) hours as needed.        . dipyridamole-aspirin (AGGRENOX) 25-200 MG per 12 hr capsule Take 1 capsule by mouth 2 (two) times daily.        Marland Kitchen FLUoxetine (PROZAC) 20 MG capsule Take 20 mg by mouth daily.        Marland Kitchen levothyroxine (SYNTHROID, LEVOTHROID) 88 MCG tablet Take 88 mcg by mouth daily.        . nitroGLYCERIN (NITROSTAT) 0.4 MG SL tablet Place 0.4 mg under the tongue every 5 (five) minutes as needed.        . pantoprazole (PROTONIX) 40 MG tablet Take 40 mg by mouth daily.        . polyethylene glycol (MIRALAX /  GLYCOLAX) packet Take 17 g by mouth daily.        . pregabalin (LYRICA) 75 MG capsule Take 75 mg by mouth daily.        . ranolazine (RANEXA) 500 MG 12 hr tablet Take 500 mg by mouth as needed.        . simvastatin (ZOCOR) 40 MG tablet Take 40 mg by mouth at bedtime.        . traMADol (ULTRAM) 50 MG tablet Take 50 mg by mouth 2 (two) times daily as needed.          Allergies  Allergen Reactions  . Morphine   . Penicillins   . Zolpidem Tartrate     No family history on file.  History   Social History  . Marital Status: Married    Spouse Name: N/A    Number of Children: N/A  . Years of Education: N/A   Occupational History  . disabled    Social History Main Topics  . Smoking status: Never Smoker   . Smokeless tobacco: Not on file  . Alcohol Use: No  . Drug Use: No  . Sexually Active: Not on file  Other Topics Concern  . Not on file   Social History Narrative  . No narrative on file    ROS ALL NEGATIVE EXCEPT THOSE NOTED IN HPI  PE  General Appearance: well developed, well nourished in no acute distress HEENT: symmetrical face, PERRLA, good dentition , temporal arteries nontender Neck: no JVD, thyromegaly, or adenopathy, trachea midline Chest: symmetric without deformity Cardiac: PMI non-displaced, RRR, normal S1, S2, no gallop or murmur Lung: clear to ausculation and percussion Vascular: all pulses full without bruits  Abdominal: nondistended, nontender, good bowel sounds, no HSM, no bruits Extremities: no cyanosis, clubbing ,Minimal edema, no sign of DVT, no varicosities  Skin: normal color, no rashes Neuro: alert and oriented x 3, non-focal, baseline Pysch: normal affect  EKG Normal sinus rhythm, poor R wave progression anterior precordium, no change BMET    Component Value Date/Time   NA 140 09/10/2009 0000   K 4.0 09/10/2009 0000   CL 108 09/10/2009 0000   CO2 30 09/10/2009 0000   GLUCOSE 90 09/10/2009 0000   BUN 13 09/10/2009 0000   CREATININE 0.7 09/10/2009  0000   CALCIUM 9.2 09/10/2009 0000   GFRNONAA 91.48 09/10/2009 0000    Lipid Panel     Component Value Date/Time   CHOL 164 09/10/2009 0000   TRIG 111.0 09/10/2009 0000   HDL 55.70 09/10/2009 0000   CHOLHDL 3 09/10/2009 0000   VLDL 22.2 09/10/2009 0000   LDLCALC 86 09/10/2009 0000    CBC No results found for this basename: wbc, rbc, hgb, hct, plt, mcv, mch, mchc, rdw, neutrabs, lymphsabs, monoabs, eosabs, basosabs

## 2011-09-28 ENCOUNTER — Other Ambulatory Visit: Payer: Self-pay | Admitting: Unknown Physician Specialty

## 2011-09-28 DIAGNOSIS — Z1231 Encounter for screening mammogram for malignant neoplasm of breast: Secondary | ICD-10-CM

## 2011-10-28 ENCOUNTER — Ambulatory Visit: Payer: Medicare Other

## 2011-10-30 ENCOUNTER — Ambulatory Visit
Admission: RE | Admit: 2011-10-30 | Discharge: 2011-10-30 | Disposition: A | Discharge status: Home / Self Care | Payer: Medicare Other | Source: Ambulatory Visit | Attending: Unknown Physician Specialty | Admitting: Unknown Physician Specialty

## 2011-10-30 DIAGNOSIS — Z1231 Encounter for screening mammogram for malignant neoplasm of breast: Secondary | ICD-10-CM

## 2011-12-17 DIAGNOSIS — Z0000 Encounter for general adult medical examination without abnormal findings: Secondary | ICD-10-CM | POA: Insufficient documentation

## 2012-03-24 DIAGNOSIS — S0990XA Unspecified injury of head, initial encounter: Secondary | ICD-10-CM | POA: Insufficient documentation

## 2012-08-18 ENCOUNTER — Ambulatory Visit (INDEPENDENT_AMBULATORY_CARE_PROVIDER_SITE_OTHER): Payer: Medicare Other | Admitting: Cardiology

## 2012-08-18 ENCOUNTER — Encounter: Payer: Self-pay | Admitting: Cardiology

## 2012-08-18 VITALS — BP 112/80 | HR 69 | Ht 62.0 in | Wt 147.0 lb

## 2012-08-18 DIAGNOSIS — I6350 Cerebral infarction due to unspecified occlusion or stenosis of unspecified cerebral artery: Secondary | ICD-10-CM

## 2012-08-18 DIAGNOSIS — I2510 Atherosclerotic heart disease of native coronary artery without angina pectoris: Secondary | ICD-10-CM

## 2012-08-18 DIAGNOSIS — E785 Hyperlipidemia, unspecified: Secondary | ICD-10-CM

## 2012-08-18 NOTE — Patient Instructions (Addendum)
Continue your current medications as directed.  Follow-up with Norma Fredrickson NP in 1 year. We will mail you a reminder to make an appointment

## 2012-08-18 NOTE — Progress Notes (Signed)
HPI [name] returns today for evaluation and management of her nonobstructive coronary disease and history of myocardial infarction as well as history of stroke. Her angina is stable and she occasionally use when necessary Ranexa. I've advised her against this but she still chooses to do so. It has been successful.  She remains active despite her neurological deficit. She offers no other complaints. Past Medical History  Diagnosis Date  . Unstable angina   . Hypothyroidism   . CVA (cerebral vascular accident)   . AMI (acute myocardial infarction)     Current Outpatient Prescriptions  Medication Sig Dispense Refill  . ALPRAZolam (XANAX) 0.5 MG tablet Take 0.5 mg by mouth 2 (two) times daily as needed.        Marland Kitchen amitriptyline (ELAVIL) 50 MG tablet Take 25 mg by mouth at bedtime.       . cetirizine (ZYRTEC) 10 MG tablet Take 10 mg by mouth daily.        . clopidogrel (PLAVIX) 75 MG tablet Take 1 tablet (75 mg total) by mouth daily.  90 tablet  3  . diphenhydrAMINE (BENADRYL) 25 MG tablet Take 25 mg by mouth every 6 (six) hours as needed.        . dipyridamole-aspirin (AGGRENOX) 25-200 MG per 12 hr capsule Take 1 capsule by mouth 2 (two) times daily.        Marland Kitchen FLUoxetine (PROZAC) 20 MG capsule Take 20 mg by mouth daily.        Marland Kitchen gabapentin (NEURONTIN) 300 MG capsule Take 300 mg by mouth Daily.      Marland Kitchen levothyroxine (SYNTHROID, LEVOTHROID) 88 MCG tablet Take 88 mcg by mouth daily.        . nitroGLYCERIN (NITROSTAT) 0.4 MG SL tablet Place 0.4 mg under the tongue every 5 (five) minutes as needed.        . pantoprazole (PROTONIX) 40 MG tablet Take 40 mg by mouth daily.        . polyethylene glycol (MIRALAX / GLYCOLAX) packet Take 17 g by mouth daily.        . pregabalin (LYRICA) 75 MG capsule Take 75 mg by mouth daily.        . ranolazine (RANEXA) 500 MG 12 hr tablet Take 500 mg by mouth as needed.        Marland Kitchen rOPINIRole (REQUIP) 0.5 MG tablet Take 0.5 mg by mouth Daily.      . simvastatin  (ZOCOR) 40 MG tablet Take 40 mg by mouth at bedtime.        . traMADol (ULTRAM) 50 MG tablet Take 50 mg by mouth 2 (two) times daily as needed.          Allergies  Allergen Reactions  . Morphine   . Penicillins   . Zolpidem Tartrate     No family history on file.  History   Social History  . Marital Status: Married    Spouse Name: N/A    Number of Children: N/A  . Years of Education: N/A   Occupational History  . disabled    Social History Main Topics  . Smoking status: Never Smoker   . Smokeless tobacco: Not on file  . Alcohol Use: No  . Drug Use: No  . Sexually Active: Not on file   Other Topics Concern  . Not on file   Social History Narrative  . No narrative on file    ROS ALL NEGATIVE EXCEPT THOSE NOTED IN HPI  PE  General  Appearance: well developed, well nourished in no acute distress HEENT: symmetrical face, PERRLA, good dentition  Neck: no JVD, thyromegaly, or adenopathy, trachea midline Chest: symmetric without deformity Cardiac: PMI non-displaced, RRR, normal S1, S2, no gallop or murmur Lung: clear to ausculation and percussion Vascular: all pulses full without bruits  Abdominal: nondistended, nontender, good bowel sounds, no HSM, no bruits Extremities: no cyanosis, clubbing or edema, no sign of DVT, no varicosities  Skin: normal color, no rashes Neuro: alert and oriented x 3, baseline neurological deficits Pysch: normal affect  EKG Normal sinus rhythm, minimal voltage criteria for LVH. BMET    Component Value Date/Time   NA 140 09/10/2009 0000   K 4.0 09/10/2009 0000   CL 108 09/10/2009 0000   CO2 30 09/10/2009 0000   GLUCOSE 90 09/10/2009 0000   BUN 13 09/10/2009 0000   CREATININE 0.7 09/10/2009 0000   CALCIUM 9.2 09/10/2009 0000   GFRNONAA 91.48 09/10/2009 0000    Lipid Panel     Component Value Date/Time   CHOL 164 09/10/2009 0000   TRIG 111.0 09/10/2009 0000   HDL 55.70 09/10/2009 0000   CHOLHDL 3 09/10/2009 0000   VLDL 22.2 09/10/2009 0000   LDLCALC  86 09/10/2009 0000    CBC No results found for this basename: wbc, rbc, hgb, hct, plt, mcv, mch, mchc, rdw, neutrabs, lymphsabs, monoabs, eosabs, basosabs

## 2012-08-18 NOTE — Assessment & Plan Note (Signed)
Stable. Continue secondary preventative therapy. Return the office in one year. 

## 2012-10-05 ENCOUNTER — Other Ambulatory Visit: Payer: Self-pay

## 2012-10-05 DIAGNOSIS — Z1231 Encounter for screening mammogram for malignant neoplasm of breast: Secondary | ICD-10-CM

## 2012-11-07 ENCOUNTER — Ambulatory Visit
Admission: RE | Admit: 2012-11-07 | Discharge: 2012-11-07 | Disposition: A | Discharge status: Home / Self Care | Payer: Medicare Other | Source: Ambulatory Visit

## 2012-11-07 DIAGNOSIS — Z1231 Encounter for screening mammogram for malignant neoplasm of breast: Secondary | ICD-10-CM

## 2012-12-20 DIAGNOSIS — G2581 Restless legs syndrome: Secondary | ICD-10-CM | POA: Insufficient documentation

## 2013-03-24 DIAGNOSIS — M79609 Pain in unspecified limb: Secondary | ICD-10-CM | POA: Insufficient documentation

## 2013-06-13 DIAGNOSIS — K219 Gastro-esophageal reflux disease without esophagitis: Secondary | ICD-10-CM | POA: Insufficient documentation

## 2013-06-13 DIAGNOSIS — I6789 Other cerebrovascular disease: Secondary | ICD-10-CM | POA: Insufficient documentation

## 2013-06-13 DIAGNOSIS — E7800 Pure hypercholesterolemia, unspecified: Secondary | ICD-10-CM | POA: Insufficient documentation

## 2013-06-13 DIAGNOSIS — I639 Cerebral infarction, unspecified: Secondary | ICD-10-CM | POA: Insufficient documentation

## 2013-06-22 DIAGNOSIS — Z23 Encounter for immunization: Secondary | ICD-10-CM | POA: Insufficient documentation

## 2013-08-18 ENCOUNTER — Encounter: Payer: Self-pay | Admitting: Nurse Practitioner

## 2013-08-18 ENCOUNTER — Ambulatory Visit (INDEPENDENT_AMBULATORY_CARE_PROVIDER_SITE_OTHER): Payer: Medicare Other | Admitting: Nurse Practitioner

## 2013-08-18 VITALS — BP 110/70 | HR 67 | Ht 62.0 in | Wt 149.8 lb

## 2013-08-18 DIAGNOSIS — I2510 Atherosclerotic heart disease of native coronary artery without angina pectoris: Secondary | ICD-10-CM

## 2013-08-18 MED ORDER — ASPIRIN EC 81 MG PO TBEC: 81.0000 mg | DELAYED_RELEASE_TABLET | Freq: Every day | ORAL | Status: DC

## 2013-08-18 MED ORDER — NITROGLYCERIN 0.4 MG SL SUBL: 0.4000 mg | SUBLINGUAL_TABLET | SUBLINGUAL | Status: DC | PRN

## 2013-08-18 NOTE — Patient Instructions (Addendum)
Stay on your current medicines - but we are stopping the Aggrenox - continue with the Plavix and take just a coated baby aspirin daily as well - I have refilled your NTG today  Use your NTG under your tongue for recurrent chest pain. May take one tablet every 5 minutes. If you are still having discomfort after 3 tablets in 15 minutes, call 911.  Continue with your exercise program  I will see you in a year  Call the [facility] office at [phone] if you have any questions, problems or concerns.

## 2013-08-18 NOTE — Progress Notes (Signed)
[name] Date of Birth: 1951/01/21 Medical Record #[id number]  History of Present Illness: [name] is seen back today for a one year check. Former patient of Dr. Vern Claude. She has history of past NSTEMI back in 2007 - with unsuccessful PCI to the distal LAD - managed medically, HLD and prior stroke.   Has not been seen since last year. Seemed to be stable.   Comes back today. Here alone. Doing well. No problems over the past year. Uses Ranexa prn. Not using NTG. Remains on Plavix and Aggrenox - not clear as to why - says she has been on this regimen for many years. No bleeding or bruising. Her labs are checked by her PCP - last done in November. She continues to exercise regularly despite her limitations from her remote stroke.   Current Outpatient Prescriptions  Medication Sig Dispense Refill  . ALPRAZolam (XANAX) 0.5 MG tablet Take 0.5 mg by mouth 2 (two) times daily as needed.        . cetirizine (ZYRTEC) 10 MG tablet Take 10 mg by mouth daily.        . clopidogrel (PLAVIX) 75 MG tablet Take 1 tablet (75 mg total) by mouth daily.  90 tablet  3  . diphenhydrAMINE (BENADRYL) 25 MG tablet Take 25 mg by mouth every 6 (six) hours as needed.        . dipyridamole-aspirin (AGGRENOX) 25-200 MG per 12 hr capsule Take 1 capsule by mouth 2 (two) times daily.        Marland Kitchen FLUoxetine (PROZAC) 20 MG capsule Take 20 mg by mouth daily.        Marland Kitchen gabapentin (NEURONTIN) 300 MG capsule Take 300 mg by mouth Daily.      Marland Kitchen levothyroxine (SYNTHROID, LEVOTHROID) 88 MCG tablet Take 88 mcg by mouth daily.        . nitroGLYCERIN (NITROSTAT) 0.4 MG SL tablet Place 0.4 mg under the tongue every 5 (five) minutes as needed.        . pantoprazole (PROTONIX) 40 MG tablet Take 40 mg by mouth daily.        . polyethylene glycol (MIRALAX / GLYCOLAX) packet Take 17 g by mouth daily.        . ranolazine (RANEXA) 500 MG 12 hr tablet Take 500 mg by mouth as needed.        Marland Kitchen rOPINIRole (REQUIP) 0.5 MG tablet Take 0.5 mg by  mouth Daily.      . simvastatin (ZOCOR) 40 MG tablet Take 40 mg by mouth at bedtime.        . tapentadol (NUCYNTA) 50 MG TABS tablet Take 50 mg by mouth every 4 (four) hours as needed.       No current facility-administered medications for this visit.    Allergies  Allergen Reactions  . Aspirin     Gastric intolerance  . Morphine   . Penicillins   . Zolpidem Tartrate     Past Medical History  Diagnosis Date  . Unstable angina   . Hypothyroidism   . CVA (cerebral vascular accident)   . AMI (acute myocardial infarction)     Past Surgical History  Procedure Laterality Date  . Endometrial ablation  3/06    History  Smoking status  . Never Smoker   Smokeless tobacco  . Not on file    History  Alcohol Use No    History reviewed. No pertinent family history.  Review of Systems: The review of systems is per  the HPI.  All other systems were reviewed and are negative.  Physical Exam: BP 110/70  Pulse 67  Ht 5\' 2"  (1.575 m)  Wt 149 lb 12.8 oz (67.949 kg)  BMI 27.39 kg/m2  SpO2 96% Patient is very pleasant and in no acute distress. Skin is warm and dry. Color is normal.  HEENT is unremarkable. Normocephalic/atraumatic. PERRL. Sclera are nonicteric. Neck is supple. No masses. No JVD. Lungs are clear. Cardiac exam shows a regular rate and rhythm. Abdomen is soft. Extremities are without edema. Gait and ROM are intact. Left sided weakness and foot with a splint.   LABORATORY DATA: EKG shows sinus rhythm   Lab Results  Component Value Date   GLUCOSE 90 09/10/2009   CHOL 164 09/10/2009   TRIG 111.0 09/10/2009   HDL 55.70 09/10/2009   LDLCALC 86 09/10/2009   ALT 23 09/10/2009   AST 24 09/10/2009   NA 140 09/10/2009   K 4.0 09/10/2009   CL 108 09/10/2009   CREATININE 0.7 09/10/2009   BUN 13 09/10/2009   CO2 30 09/10/2009   CARDIAC CATHETERIZATION HEMODYNAMICS:  1. Left main coronary artery: The left main coronary artery is free of  significant disease.  2. Left anterior descending  artery: The left anterior descending artery  gave rise to two diagonal branches and a septal perforator. The LAD was  quite tortuous in its mid portion and then had a 99% occlusion in its  distal portion with TIMI-I flow.  3. Circumflex artery: The circumflex artery gave rise to an atrial branch,  marginal branch, and two posterolateral branches. These vessels were  free of significant disease.  4. Right coronary artery: The right coronary artery is a moderate size  vessel which gave rise to a clonus branch, right ventricular branch,  posterior descending branch, and a posterolateral branch. This vessel  was free of significant disease.  LEFT VENTRICULOGRAM: The left ventriculogram was obtained in the RAO  projection showed akinesis of the tip of the apex in the inferoapical  segment. The overall wall motion was good with an estimated ejection  fraction of 60%.  Following attempt at PCA there was persistent 99% stenosis in the distal LAD  with TIMI-I flow.  CONCLUSION:  1. Coronary artery disease, status post recent non-ST-elevation myocardial  infarction with 99% stenosis in the distal left anterior descending with  TIMI-I flow, no significant obstruction in the circumflex and right  coronary arteries, and inferoapical wall akinesis.  2. Unsuccessful percutaneous coronary intervention of the lesion in the  distal left anterior descending due to inability to cross with the wire.  DISPOSITION: The patient returned to the postanesthesia care unit for  further observation. I think her outlook should be good on medical therapy.  She has no significant residual coronary disease and overall LV function is  good.  ______________________________  Charlies ConstableBruce Brodie, M.D. [facility]  BB/MEDQ D: 08/25/2005 T: 08/25/2005 Job: [id number]   Assessment / Plan: 1. CAD - remote NSTEMI - with unsuccessful PCI to the distal LAD and no significant residual CAD - managed medically - she has no symptoms. Would favor  continued medical management - would use the NTG prn and not Ranexa. NTG refilled today. Continue with CV risk factor modification.   2. HLD - on statin - monitored by PCP  3. Prior stroke - I am stopping the Aggrenox - talked with Dr. Jens Somrenshaw (DOD today). Will continue just the Plavix.   See her back in one year. Labs are checked  by PCP. Overall, she seems to be doing well despite her left sided weakness.   Patient is agreeable to this plan and will call if any problems develop in the interim.   Rosalio Macadamia, RN, ANP-C [facility] [address] [place], [place]  [address] [phone]

## 2013-10-11 ENCOUNTER — Other Ambulatory Visit: Payer: Self-pay

## 2013-10-11 DIAGNOSIS — Z1231 Encounter for screening mammogram for malignant neoplasm of breast: Secondary | ICD-10-CM

## 2013-10-18 ENCOUNTER — Encounter: Payer: Self-pay | Admitting: Neurology

## 2013-10-19 ENCOUNTER — Ambulatory Visit: Payer: Medicare Other | Admitting: Neurology

## 2013-11-08 ENCOUNTER — Ambulatory Visit: Payer: Medicare Other

## 2013-11-13 ENCOUNTER — Encounter (INDEPENDENT_AMBULATORY_CARE_PROVIDER_SITE_OTHER): Payer: Self-pay

## 2013-11-13 ENCOUNTER — Encounter: Payer: Self-pay | Admitting: Neurology

## 2013-11-13 ENCOUNTER — Ambulatory Visit (INDEPENDENT_AMBULATORY_CARE_PROVIDER_SITE_OTHER): Payer: Medicare Other | Admitting: Neurology

## 2013-11-13 ENCOUNTER — Ambulatory Visit
Admission: RE | Admit: 2013-11-13 | Discharge: 2013-11-13 | Disposition: A | Discharge status: Home / Self Care | Payer: Medicare Other | Source: Ambulatory Visit

## 2013-11-13 VITALS — Ht 62.5 in | Wt 146.0 lb

## 2013-11-13 DIAGNOSIS — I639 Cerebral infarction, unspecified: Secondary | ICD-10-CM

## 2013-11-13 DIAGNOSIS — Z1231 Encounter for screening mammogram for malignant neoplasm of breast: Secondary | ICD-10-CM

## 2013-11-13 DIAGNOSIS — G8110 Spastic hemiplegia affecting unspecified side: Secondary | ICD-10-CM

## 2013-11-13 DIAGNOSIS — I6350 Cerebral infarction due to unspecified occlusion or stenosis of unspecified cerebral artery: Secondary | ICD-10-CM

## 2013-11-13 NOTE — Progress Notes (Signed)
[facility]    Provider:  Dr Hosie PoissonSumner Referring Provider: Selinda FlavinHoward, Kevin, MD Primary Care Physician:  Dawn FlavinHOWARD, KEVIN, MD  CC:  Left sided weakness and spasticity  HPI:  [name] is a 63 y.o. female here as a referral from [name] for LUE spasticity. Patient unfortunately suffered stroke in 1999 with resultant left sided weakness and spasticity (flexion contracture). She has undergone Botox in the past with good benefit. Recently suffered a fall with resultant left supracondylar fracture followed by ortho surgery. After the stroke underwent extensive rehab, continues to work twice a week with a trainer. Underwent a trial with Dr. Marya Newton with Botox for around 2 years, noted good benefit but unable to continue due to cost. She has noticed increased tone in her upper extremity. She has noticed increased difficulty with ADLs, having more difficulty with changing clothes, cleaning her left palm. This has negative impacted her quality of life. No wrist or elbow extension.   Review of Systems: Out of a complete 14 system review, the patient complains of only the following symptoms, and all other reviewed systems are negative. + easy bruising, insomnia, restless legs, constipation, allergies, runny nose  History   Social History  . Marital Status: Married    Spouse Name: N/A    Number of Children: N/A  . Years of Education: N/A   Occupational History  . disabled    Social History Main Topics  . Smoking status: Former Games developermoker  . Smokeless tobacco: Not on file     Comment: Quit in 2005  . Alcohol Use: Yes     Comment: social  . Drug Use: No  . Sexual Activity: Not Currently   Other Topics Concern  . Not on file   Social History Narrative   Married, no children   Right handed   12th grade   1 cup daily    No family history on file.  Past Medical History  Diagnosis Date  . Unstable angina   . Hypothyroidism   . CVA (cerebral vascular accident) 1999    . AMI (acute myocardial infarction)     failed PCI to the distal LAD - managed medically    Past Surgical History  Procedure Laterality Date  . Endometrial ablation  3/06    Current Outpatient Prescriptions  Medication Sig Dispense Refill  . ALPRAZolam (XANAX) 0.5 MG tablet Take 0.5 mg by mouth 2 (two) times daily as needed.        . Aspirin-Dipyridamole (AGGRENOX PO) Take 25 mg by mouth.      . cetirizine (ZYRTEC) 10 MG tablet Take 10 mg by mouth daily.        . clopidogrel (PLAVIX) 75 MG tablet Take 1 tablet (75 mg total) by mouth daily.  90 tablet  3  . diphenhydrAMINE (BENADRYL) 25 MG tablet Take 25 mg by mouth every 6 (six) hours as needed.        Marland Kitchen. FLUoxetine (PROZAC) 20 MG capsule Take 20 mg by mouth daily.        Marland Kitchen. gabapentin (NEURONTIN) 300 MG capsule Take 300 mg by mouth Daily.      Marland Kitchen. levothyroxine (SYNTHROID, LEVOTHROID) 88 MCG tablet Take 88 mcg by mouth daily.        . nitroGLYCERIN (NITROSTAT) 0.4 MG SL tablet Place 1 tablet (0.4 mg total) under the tongue every 5 (five) minutes as needed.  25 tablet  3  . pantoprazole (PROTONIX) 40 MG tablet Take 40 mg by mouth daily.        .Marland Kitchen  polyethylene glycol (MIRALAX / GLYCOLAX) packet Take 17 g by mouth daily.        . ranolazine (RANEXA) 500 MG 12 hr tablet Take 500 mg by mouth as needed.        Marland Kitchen. rOPINIRole (REQUIP) 0.5 MG tablet Take 0.5 mg by mouth Daily.      . simvastatin (ZOCOR) 40 MG tablet Take 40 mg by mouth at bedtime.        . tapentadol (NUCYNTA) 50 MG TABS tablet Take 50 mg by mouth every 4 (four) hours as needed.       No current facility-administered medications for this visit.    Allergies as of 11/13/2013 - Review Complete 11/13/2013  Allergen Reaction Noted  . Aspirin  08/18/2012  . Morphine  01/01/2009  . Penicillins  01/01/2009  . Zolpidem tartrate  01/01/2009    Vitals: Ht 5' 2.5" (1.588 m)  Wt 146 lb (66.225 kg)  BMI 26.26 kg/m2 Last Weight:  Wt Readings from Last 1 Encounters:  11/13/13 146 lb  (66.225 kg)   Last Height:   Ht Readings from Last 1 Encounters:  11/13/13 5' 2.5" (1.588 m)     Physical exam: Exam: Gen: NAD, conversant Eyes: anicteric sclerae, moist conjunctivae HENT: Atraumatic, oropharynx clear Neck: Trachea midline; supple,  Lungs: CTA, no wheezing, rales, rhonic                          CV: RRR, no MRG Abdomen: Soft, non-tender;  Extremities: No peripheral edema  Skin: Normal temperature, no rash,  Psych: Appropriate affect, pleasant  Neuro: MS: AA&Ox3, appropriately interactive, normal affect   Attention: WORLD backwards  Speech: fluent w/o paraphasic error  Memory: good recent and remote recall  CN: PERRL, EOMI no nystagmus, no ptosis, sensation intact to LT V1-V3 bilat, flattening left NLF, L facial weakness, hearing grossly intact, palate elevates symmetrically, shoulder shrug 5/5 bilat,  tongue protrudes midline, no fasiculations noted.  Motor: increased tone LUE and LLE Flexed posture LUE fingers/wrist >elbow, with difficulty able to fully extend fingers and wrist, mild pronation, impaired abduction shoulder  Sens: LT intact in all extremities    Assessment:  After physical and neurologic examination, review of laboratory studies, imaging, neurophysiology testing and pre-existing records, assessment will be reviewed on the problem list.  Plan:  Treatment plan and additional workup will be reviewed under Problem List.  1)Spastic hemiplegia non-dominant side 2)CVA  62y/o woman with history of CVA with resultant left sided weakness and spasticity presenting for initial evaluation. She notes increased difficulty with ADLs due to the flexed posture of ADLs. She has tried Botox in the past with good benefit but discontinued due to cost. Presents today interested in trying again. She is an ideal candidate for injections to LUE as the spasticity is negatively impacting quality of life and she is unable to tolerate oral alternatives. Will place  request for 200units of Botox for LUE spasticity. Follow up once insurance approval granted.   Elspeth ChoPeter Jaylyn Booher, DO  [facility] [address] [place], [place] [id number]  Phone [phone] Fax [phone]

## 2013-11-13 NOTE — Patient Instructions (Addendum)
Overall you are doing fairly well but I do want to suggest a few things today:   Remember to drink plenty of fluid, eat healthy meals and do not skip any meals. Try to eat protein with a every meal and eat a healthy snack such as fruit or nuts in between meals. Try to keep a regular sleep-wake schedule and try to exercise daily, particularly in the form of walking, 20-30 minutes a day, if you can.   As far as your medications are concerned, I would like to suggest trying Botox therapy to improve the mobility of your muscles. We will place a request with your insurance company and contact you once we get approval.    Please call us with any interim questions, concerns, problems, updates or refill requests.   My clinical assistant and will answer any of your questions and relay your messages to me and also relay most of my messages to you.   Our phone number is [phone]. We also have an after hours call service for urgent matters and there is a physician on-call for urgent questions. For any emergencies you know to call 911 or go to the nearest emergency room

## 2013-11-27 ENCOUNTER — Encounter: Payer: Self-pay | Admitting: Neurology

## 2013-11-27 ENCOUNTER — Telehealth: Payer: Self-pay | Admitting: Neurology

## 2013-11-27 NOTE — Telephone Encounter (Signed)
LMVM that appointment has been changed and letter mailed. Husband called back during this message and advised me to call home number. And spoke with patient

## 2013-12-14 ENCOUNTER — Ambulatory Visit: Payer: Medicare Other | Admitting: Neurology

## 2013-12-18 ENCOUNTER — Ambulatory Visit (INDEPENDENT_AMBULATORY_CARE_PROVIDER_SITE_OTHER): Payer: Medicare Other | Admitting: Neurology

## 2013-12-18 ENCOUNTER — Encounter: Payer: Self-pay | Admitting: Neurology

## 2013-12-18 ENCOUNTER — Ambulatory Visit: Payer: Medicare Other | Admitting: Neurology

## 2013-12-18 VITALS — BP 115/73 | HR 74 | Ht 62.5 in | Wt 148.0 lb

## 2013-12-18 DIAGNOSIS — G8114 Spastic hemiplegia affecting left nondominant side: Secondary | ICD-10-CM

## 2013-12-18 DIAGNOSIS — G8110 Spastic hemiplegia affecting unspecified side: Secondary | ICD-10-CM

## 2013-12-18 MED ORDER — ONABOTULINUMTOXINA 100 UNITS IJ SOLR: 200.0000 [IU] | Freq: Once | INTRAMUSCULAR | Status: DC

## 2013-12-18 NOTE — Progress Notes (Signed)
[facility]   Provider:  Dr Hosie PoissonSumner Referring Provider: Selinda FlavinHoward, Kevin, MD Primary Care Physician:  Selinda FlavinHOWARD, KEVIN, MD  CC:  Spastic hemiplegia  HPI:  [name] is a 63 y.o. female here for  Botox injections for spastic hemiplegia. She has had injections in the past (>2years ago) with good benefit, she stopped due to cost.  Physical Exam: Flexed posture at wrist, DIP, PIP, mild pronation  Contraindications and precautions discussed with patient. EMG guidance was used to inject muscles detailed below. Aseptic procedure was observed and patient tolerated procedure. Procedure performed by Dr. Elspeth ChoPeter Lorana Maffeo.  The condition has existed for more than 6 months, and pt does not have a diagnosis of ALS, Myasthenia Gravis or Lambert-Eaton Syndrome.  Risks and benefits of injections discussed and pt agrees to proceed with the procedure.  Written consent obtained These injections are medically necessary. He receives good benefits from these injections. These injections do not cause sedations or hallucinations which the oral therapies may cause.  Indication/Diagnosis: spastic hemiplegia  Type of toxin: Botox  Lot # [id number]  Expiration date: 05/2016  Injection sites:  L FCU: 40 L FCR: 40 L FDB: 30(15x2) L FDS: 30(15x2) L FPB: 10  Total 150units    History   Social History  . Marital Status: Married    Spouse Name: N/A    Number of Children: N/A  . Years of Education: N/A   Occupational History  . disabled    Social History Main Topics  . Smoking status: Former Games developermoker  . Smokeless tobacco: Not on file     Comment: Quit in 2005  . Alcohol Use: Yes     Comment: social  . Drug Use: No  . Sexual Activity: Not Currently   Other Topics Concern  . Not on file   Social History Narrative   Married, no children   Right handed   12th grade   1 cup daily    No family history on file.  Past Medical History  Diagnosis Date  . Unstable angina   .  Hypothyroidism   . CVA (cerebral vascular accident) 1999  . AMI (acute myocardial infarction)     failed PCI to the distal LAD - managed medically    Past Surgical History  Procedure Laterality Date  . Endometrial ablation  3/06    Current Outpatient Prescriptions  Medication Sig Dispense Refill  . ALPRAZolam (XANAX) 0.5 MG tablet Take 0.5 mg by mouth 2 (two) times daily as needed.        . Aspirin-Dipyridamole (AGGRENOX PO) Take 25 mg by mouth.      . cetirizine (ZYRTEC) 10 MG tablet Take 10 mg by mouth daily.        . clopidogrel (PLAVIX) 75 MG tablet Take 1 tablet (75 mg total) by mouth daily.  90 tablet  3  . diphenhydrAMINE (BENADRYL) 25 MG tablet Take 25 mg by mouth every 6 (six) hours as needed.        Marland Kitchen. FLUoxetine (PROZAC) 20 MG capsule Take 20 mg by mouth daily.        Marland Kitchen. gabapentin (NEURONTIN) 300 MG capsule Take 300 mg by mouth Daily.      Marland Kitchen. levothyroxine (SYNTHROID, LEVOTHROID) 88 MCG tablet Take 88 mcg by mouth daily.        . nitroGLYCERIN (NITROSTAT) 0.4 MG SL tablet Place 1 tablet (0.4 mg total) under the tongue every 5 (five) minutes as needed.  25 tablet  3  .  pantoprazole (PROTONIX) 40 MG tablet Take 40 mg by mouth daily.        . polyethylene glycol (MIRALAX / GLYCOLAX) packet Take 17 g by mouth daily.        . ranolazine (RANEXA) 500 MG 12 hr tablet Take 500 mg by mouth as needed.        Marland Kitchen. rOPINIRole (REQUIP) 0.5 MG tablet Take 0.5 mg by mouth Daily.      . simvastatin (ZOCOR) 40 MG tablet Take 40 mg by mouth at bedtime.        . tapentadol (NUCYNTA) 50 MG TABS tablet Take 50 mg by mouth every 4 (four) hours as needed.       No current facility-administered medications for this visit.    Allergies as of 12/18/2013 - Review Complete 12/18/2013  Allergen Reaction Noted  . Aspirin  08/18/2012  . Morphine  01/01/2009  . Penicillins  01/01/2009  . Zolpidem tartrate  01/01/2009    Vitals: BP 115/73  Pulse 74  Ht 5' 2.5" (1.588 m)  Wt 148 lb (67.132 kg)  BMI  26.62 kg/m2 Last Weight:  Wt Readings from Last 1 Encounters:  12/18/13 148 lb (67.132 kg)   Last Height:   Ht Readings from Last 1 Encounters:  12/18/13 5' 2.5" (1.588 m)      Assessment:  After physical and neurologic examination, review of laboratory studies, imaging, neurophysiology testing and pre-existing records, assessment will be reviewed on the problem list.  Plan:  Treatment plan and additional workup will be reviewed under Problem List.  [name] is a 63 y.o. female here for botulinum toxin injections. They tolerated procedure well with no complications.   1) Botox injections as detailed above. A total of 150 units was used. Will order 200 units for next visit 2) Tylenol or Motrin for injection site pain. 3) Medication guide dispensed. 4) Follow up for repeat injections in 3 months 5) Referral placed for OT therapy, will benefit from splint   Elspeth ChoPeter Chrisann Melaragno, DO  [facility] [address] [place], [place] [id number]  Phone [phone] Fax [phone]

## 2013-12-19 DIAGNOSIS — F3289 Other specified depressive episodes: Secondary | ICD-10-CM | POA: Insufficient documentation

## 2013-12-19 DIAGNOSIS — F329 Major depressive disorder, single episode, unspecified: Secondary | ICD-10-CM | POA: Insufficient documentation

## 2013-12-21 ENCOUNTER — Ambulatory Visit: Payer: Medicare Other | Admitting: Neurology

## 2013-12-22 ENCOUNTER — Ambulatory Visit: Payer: Medicare Other | Admitting: Neurology

## 2014-05-11 ENCOUNTER — Telehealth: Payer: Self-pay | Admitting: Neurology

## 2014-05-11 NOTE — Telephone Encounter (Signed)
Patient calling to schedule next botox appointment.  Please call and advise.

## 2014-05-11 NOTE — Telephone Encounter (Signed)
Patient calling to state that she thinks it is time for her to schedule her botox, please return call and advise.

## 2014-05-14 NOTE — Telephone Encounter (Signed)
Please advise. Thanks.  

## 2014-05-14 NOTE — Telephone Encounter (Signed)
Called and spoke to patient and advised that I will be starting the process and calling her back.

## 2014-06-13 ENCOUNTER — Encounter: Payer: Self-pay | Admitting: Neurology

## 2014-06-13 ENCOUNTER — Ambulatory Visit (INDEPENDENT_AMBULATORY_CARE_PROVIDER_SITE_OTHER): Payer: Medicare Other | Admitting: Neurology

## 2014-06-13 DIAGNOSIS — G8114 Spastic hemiplegia affecting left nondominant side: Secondary | ICD-10-CM

## 2014-06-13 DIAGNOSIS — G8112 Spastic hemiplegia affecting left dominant side: Secondary | ICD-10-CM

## 2014-06-13 DIAGNOSIS — I6350 Cerebral infarction due to unspecified occlusion or stenosis of unspecified cerebral artery: Secondary | ICD-10-CM

## 2014-06-13 DIAGNOSIS — I639 Cerebral infarction, unspecified: Secondary | ICD-10-CM

## 2014-06-13 MED ORDER — ONABOTULINUMTOXINA 100 UNITS IJ SOLR
300.0000 [IU] | Freq: Once | INTRAMUSCULAR | Status: AC
  Administered 2014-06-13: 300 [IU] via INTRAMUSCULAR

## 2014-06-13 NOTE — Addendum Note (Signed)
Addended by: Levert FeinsteinYAN, Barri Neidlinger on: 06/13/2014 05:17 PM   Modules accepted: Level of Service

## 2014-06-13 NOTE — Progress Notes (Signed)
[facility]  CC:  Spastic hemiplegia  [name] is a 63  y.o. Right-handed Caucasian female, return for EMG guided Botox injection for her left spastic hemiparesis,  She suffered stroke in 1999, with residual left spastic hemiparesis, upper extremity more than leg, left visual field cut  She previously was enrolled in Botox research [facility] by Dr. Marya FossaBrashear, received EMG guided Botox injection in 2010 for about 2 years, with good benefit, but has difficulty with insurance  She began to receive Botox injection through our clinic by Dr. Hosie PoissonSumner since May 2015,150 units total to left upper extremity, which has been very helpful  She is return for continued EMG guided Botox injection, at baseline, she can ambulate with a left AFO, but without assistance,  Profound left upper extremity spasticity, achiness, chronic pain, persistent left wrist flexion, finger flexion,  She also has a history of chronic artery disease in 2007, taking Plavix, and Aggrenox, exercise regularly, has personal trainer   History   Social History  . Marital Status: Married    Spouse Name: N/A    Number of Children: N/A  . Years of Education: N/A   Occupational History  . disabled    Social History Main Topics  . Smoking status: Former Games developermoker  . Smokeless tobacco: Never Used     Comment: Quit in 2005  . Alcohol Use: Yes     Comment: social  . Drug Use: No  . Sexual Activity: Not Currently   Other Topics Concern  . Not on file   Social History Narrative   Married, no children   Right handed   12th grade   1 cup daily    No family history on file.  Past Medical History  Diagnosis Date  . Unstable angina   . Hypothyroidism   . CVA (cerebral vascular accident) 1999  . AMI (acute myocardial infarction)     failed PCI to the distal LAD - managed medically    Past Surgical History  Procedure Laterality Date  . Endometrial ablation   3/06    Current Outpatient Prescriptions  Medication Sig Dispense Refill  . ALPRAZolam (XANAX) 0.5 MG tablet Take 0.5 mg by mouth 2 (two) times daily as needed.      . Aspirin-Dipyridamole (AGGRENOX PO) Take 25 mg by mouth.    . cetirizine (ZYRTEC) 10 MG tablet Take 10 mg by mouth daily.      . clopidogrel (PLAVIX) 75 MG tablet Take 1 tablet (75 mg total) by mouth daily. 90 tablet 3  . diphenhydrAMINE (BENADRYL) 25 MG tablet Take 25 mg by mouth every 6 (six) hours as needed.      Marland Kitchen. FLUoxetine (PROZAC) 20 MG capsule Take 20 mg by mouth daily.      Marland Kitchen. gabapentin (NEURONTIN) 300 MG capsule Take 300 mg by mouth Daily.    Marland Kitchen. levothyroxine (SYNTHROID, LEVOTHROID) 88 MCG tablet Take 88 mcg by mouth daily.      . nitroGLYCERIN (NITROSTAT) 0.4 MG SL tablet Place 1 tablet (0.4 mg total) under the tongue every 5 (five) minutes as needed. 25 tablet 3  . pantoprazole (PROTONIX) 40 MG tablet Take 40 mg by mouth daily.      . polyethylene glycol (MIRALAX / GLYCOLAX) packet Take 17 g by mouth daily.      . ranolazine (RANEXA) 500 MG 12 hr tablet Take 500 mg by mouth as needed.      Marland Kitchen. rOPINIRole (REQUIP) 0.5  MG tablet Take 0.5 mg by mouth Daily.    . simvastatin (ZOCOR) 40 MG tablet Take 40 mg by mouth at bedtime.      . tapentadol (NUCYNTA) 50 MG TABS tablet Take 50 mg by mouth every 4 (four) hours as needed.     Current Facility-Administered Medications  Medication Dose Route Frequency Provider Last Rate Last Dose  . botulinum toxin Type A (BOTOX) injection 200 Units  200 Units Intramuscular Once Omelia BlackwaterPeter Justin Sumner, DO        Allergies as of 06/13/2014 - Review Complete 06/13/2014  Allergen Reaction Noted  . Aspirin  08/18/2012  . Morphine  01/01/2009  . Penicillins  01/01/2009  . Zolpidem tartrate  01/01/2009    Vitals: BP   Pulse   Ht   Wt  Last Weight:  Wt Readings from Last 1 Encounters:  12/18/13 148 lb (67.132 kg)   Last Height:   Ht Readings from Last 1 Encounters:  12/18/13 5'  2.5" (1.588 m)   PHYSICAL EXAMINATOINS:  Generalized: In no acute distress  Neck: Supple, no carotid bruits   Cardiac: Regular rate rhythm  Pulmonary: Clear to auscultation bilaterally  Musculoskeletal: No deformity  Neurological examination  Mentation: Alert oriented to time, place, history taking, and causual conversation, left lower face weakness  Cranial nerve II-XII: Pupils were equal round reactive to light extraocular movements were full, left visual field cut.   Left lower face weakness. hearing was intact to finger rubbing bilaterally. Uvula tongue midline.  head turning and shoulder shrug and were normal and symmetric.Tongue protrusion into cheek strength was normal.  Motor: spastic left hemiparesis,  Wearing left AFO, left lower extremity moderate spasticity, left hip flexion 4, knee flexion 4, extension 5, Profound left upper extremity spasticity, left shoulder adduction, internal rotation,.left elbow flexion, pronation, with passive movement, mild fixed contraction of left elbow, maximum 175, left wrist forceful flexion, finger flexion,thumb in position, even with forceful movement, she could not achieve full extension of her left finger  Gait: Rising up from seated position by pushing on chair arm,spastic left hemi-circumferential gait.  Deep tendon reflexes: hyperreflexia of left side   Assessment and plan:   63 years old right-handed Caucasian female, status post stroke, with spastic left hemiparesis since 1999, responded very well to previous EMG guided Botox injection, return to clinic for repeat injection  Under EMG guidance, 300 units of Botox was injected into left upper extremity muscles  Left pectoralis major 25 units Left biceps 50 units Left brachialis 50 units  Left pronator teres 50 units Left flexor carpi radialis 25 units Left  Flexor carpi ulnaris 25 units Left flexor digitorum profundus 25 units Left flexor digitorum superficialis 25  units  Left palmaris longus 25 units  She tolerated the injection well,Return to clinic in 3 months for repeat injection

## 2014-08-08 ENCOUNTER — Ambulatory Visit: Payer: Medicare Other | Admitting: Nurse Practitioner

## 2014-08-21 ENCOUNTER — Ambulatory Visit (INDEPENDENT_AMBULATORY_CARE_PROVIDER_SITE_OTHER): Payer: 59 | Admitting: Nurse Practitioner

## 2014-08-21 ENCOUNTER — Encounter: Payer: Self-pay | Admitting: Nurse Practitioner

## 2014-08-21 VITALS — BP 110/70 | HR 71 | Ht 62.5 in | Wt 149.8 lb

## 2014-08-21 DIAGNOSIS — I259 Chronic ischemic heart disease, unspecified: Secondary | ICD-10-CM

## 2014-08-21 DIAGNOSIS — E785 Hyperlipidemia, unspecified: Secondary | ICD-10-CM

## 2014-08-21 DIAGNOSIS — I639 Cerebral infarction, unspecified: Secondary | ICD-10-CM

## 2014-08-21 DIAGNOSIS — I6350 Cerebral infarction due to unspecified occlusion or stenosis of unspecified cerebral artery: Secondary | ICD-10-CM

## 2014-08-21 NOTE — Patient Instructions (Signed)
Stay on your current medicines  See me in one year  Stay active and keep up the good work!!!!  Call the [facility] office at [phone] if you have any questions, problems or concerns.

## 2014-08-21 NOTE — Progress Notes (Signed)
CARDIOLOGY OFFICE NOTE  Date:  08/21/2014    [name] Date of Birth: 1951/01/07 Medical Record #[id number]  PCP:  Selinda Flavin, MD  Cardiologist:  Former patient of Dr. Vern Claude. Sees [facility] [place] [facility]     Chief Complaint  Patient presents with  . Coronary Artery Disease    Follow up visit - former patient of Dr. Vern Claude      History of Present Illness: [name] is a 64 y.o. female who presents today for a routine office visit. This is a 2 year check. She is a former patient of Dr. Vern Claude. She has CAD with past MI and failed PCI to the distal LAD - managed medically. Other issues include CVA, hypothyroidism, HLD and GERD.  Saw me last year.   Comes in today. Here alone. She has had a really good year. No chest pain. Not short of breath. She can ride her recumbent bike. She can walk a mile on her treadmill. She is not using NTG. Not using Ranexa as well. Labs are checked by her PCP. Refills done by her PCP. She is quite pleased with how she is doing. She is not able to drive due to the prior stroke. On chronic Plavix.    Past Medical History  Diagnosis Date  . Unstable angina   . Hypothyroidism   . CVA (cerebral vascular accident) 1999  . AMI (acute myocardial infarction)     failed PCI to the distal LAD - managed medically    Past Surgical History  Procedure Laterality Date  . Endometrial ablation  3/06     Medications: Current Outpatient Prescriptions  Medication Sig Dispense Refill  . ALPRAZolam (XANAX) 0.5 MG tablet Take 0.5 mg by mouth 2 (two) times daily as needed.      . Aspirin-Dipyridamole (AGGRENOX PO) Take 25 mg by mouth.    . cetirizine (ZYRTEC) 10 MG tablet Take 10 mg by mouth daily.      . clopidogrel (PLAVIX) 75 MG tablet Take 1 tablet (75 mg total) by mouth daily. 90 tablet 3  . diphenhydrAMINE (BENADRYL) 25 MG tablet Take 25 mg by mouth every 6 (six) hours as needed.      Marland Kitchen FLUoxetine (PROZAC) 20 MG capsule Take 20 mg by mouth daily.        Marland Kitchen gabapentin (NEURONTIN) 300 MG capsule Take 300 mg by mouth Daily.    Marland Kitchen lactulose, encephalopathy, (CHRONULAC) 10 GM/15ML SOLN Take 10 g by mouth 2 (two) times daily.     Marland Kitchen levothyroxine (SYNTHROID, LEVOTHROID) 88 MCG tablet Take 88 mcg by mouth daily.      Marland Kitchen LINZESS 145 MCG CAPS capsule Take 145 mcg by mouth daily.     . nitroGLYCERIN (NITROSTAT) 0.4 MG SL tablet Place 1 tablet (0.4 mg total) under the tongue every 5 (five) minutes as needed. 25 tablet 3  . pantoprazole (PROTONIX) 40 MG tablet Take 40 mg by mouth daily.      . polyethylene glycol (MIRALAX / GLYCOLAX) packet Take 17 g by mouth daily.      . ranolazine (RANEXA) 500 MG 12 hr tablet Take 500 mg by mouth as needed.      Marland Kitchen rOPINIRole (REQUIP) 0.5 MG tablet Take 0.5 mg by mouth Daily.    . simvastatin (ZOCOR) 40 MG tablet Take 40 mg by mouth at bedtime.      . tapentadol (NUCYNTA) 50 MG TABS tablet Take 50 mg by mouth every 4 (four) hours as needed.  Current Facility-Administered Medications  Medication Dose Route Frequency Provider Last Rate Last Dose  . botulinum toxin Type A (BOTOX) injection 200 Units  200 Units Intramuscular Once Omelia BlackwaterPeter Justin Sumner, DO        Allergies: Allergies  Allergen Reactions  . Aspirin     Gastric intolerance  . Morphine   . Penicillins   . Zolpidem Tartrate     Social History: The patient  reports that she has quit smoking. She has never used smokeless tobacco. She reports that she drinks alcohol. She reports that she does not use illicit drugs.   Family History: The patient's family history includes Arrhythmia in her father. He has a pacemaker in place. Her mother died at 2234 in a car wreck.   Review of Systems: Please see the history of present illness.   Otherwise, the review of systems is positive for easy bruising, constipation and headaches.   All other systems are reviewed and negative.   Physical Exam: VS:  BP 110/70 mmHg  Pulse 71  Ht 5' 2.5" (1.588 m)  Wt 149 lb 12.8 oz  (67.949 kg)  BMI 26.95 kg/m2 .  BMI Body mass index is 26.95 kg/(m^2). General: Pleasant. Well developed, well nourished and in no acute distress.  HEENT: Normal. Neck: Supple, no JVD, carotid bruits, or masses noted.  Cardiac: Regular rate and rhythm. No murmurs, rubs, or gallops. No edema.  Respiratory:  Lungs are clear to auscultation bilaterally with normal work of breathing.  GI: Soft and nontender.  MS: Gait and ROM intact.She limps but is able to walk.  Skin: Warm and dry. Color is normal.  Neuro:  Strength and sensation are intact but she has left sided weakness noted.  Psych: Alert, appropriate and with normal affect.   Wt Readings from Last 3 Encounters:  08/21/14 149 lb 12.8 oz (67.949 kg)  12/18/13 148 lb (67.132 kg)  11/13/13 146 lb (66.225 kg)    LABORATORY DATA:  EKG:  EKG is ordered today. The EKG ordered today demonstrates NSR.  Lab Results  Component Value Date   GLUCOSE 90 09/10/2009   CHOL 164 09/10/2009   TRIG 111.0 09/10/2009   HDL 55.70 09/10/2009   LDLCALC 86 09/10/2009   ALT 23 09/10/2009   AST 24 09/10/2009   NA 140 09/10/2009   K 4.0 09/10/2009   CL 108 09/10/2009   CREATININE 0.7 09/10/2009   BUN 13 09/10/2009   CO2 30 09/10/2009    BNP (last 3 results) No results for input(s): PROBNP in the last 8760 hours.  Other Studies Reviewed Today:   Echo Study Conclusions from 2011 Left ventricle: The cavity size was normal. Systolic function was normal. The estimated ejection fraction was in the range of 55% to 60%. Wall motion was normal; there were no regional wall motion abnormalities.   CARDIAC CATHETERIZATION 2007  HEMODYNAMICS:  1. Left main coronary artery: The left main coronary artery is free of  significant disease.  2. Left anterior descending artery: The left anterior descending artery  gave rise to two diagonal branches and a septal perforator. The LAD was  quite tortuous in its mid portion and then had a 99%  occlusion in its  distal portion with TIMI-I flow.  3. Circumflex artery: The circumflex artery gave rise to an atrial branch,  marginal branch, and two posterolateral branches. These vessels were  free of significant disease.  4. Right coronary artery: The right coronary artery is a moderate size  vessel which gave  rise to a clonus branch, right ventricular branch,  posterior descending branch, and a posterolateral branch. This vessel  was free of significant disease.  LEFT VENTRICULOGRAM: The left ventriculogram was obtained in the RAO  projection showed akinesis of the tip of the apex in the inferoapical  segment. The overall wall motion was good with an estimated ejection  fraction of 60%.  Following attempt at PCA there was persistent 99% stenosis in the distal LAD  with TIMI-I flow.  CONCLUSION:  1. Coronary artery disease, status post recent non-ST-elevation myocardial  infarction with 99% stenosis in the distal left anterior descending with  TIMI-I flow, no significant obstruction in the circumflex and right  coronary arteries, and inferoapical wall akinesis.  2. Unsuccessful percutaneous coronary intervention of the lesion in the  distal left anterior descending due to inability to cross with the wire.  DISPOSITION: The patient returned to the postanesthesia care unit for  further observation. I think her outlook should be good on medical therapy.  She has no significant residual coronary disease and overall LV function is  good.  ______________________________  Charlies Constable, M.D. [facility]  BB/MEDQ D: 08/25/2005 T: 08/25/2005 Job: [id number]   Assessment / Plan: 1. CAD - remote NSTEMI - with unsuccessful PCI to the distal LAD and no significant residual CAD - managed medically - she has no symptoms. Would favor continued medical management -and continue with CV risk factor modification.   2. HLD - on statin - monitored by PCP  3. Prior stroke - on  chronic Plavix.  See her back in one year. Labs are checked by PCP. Overall, she seems to be doing well despite her left sided weakness. I encouraged her to keep up her good work.   Current medicines are reviewed with the patient today.  The patient does not have concerns regarding medicines.  The following changes have been made:  no change  Labs/ tests ordered today include: N/A  No orders of the defined types were placed in this encounter.   Disposition:   FU with me in 1 year.   Patient is agreeable to this plan and will call if any problems develop in the interim.   Signed: Rosalio Macadamia, RN, ANP-C 08/21/2014 10:07 AM  [facility] [address] [place], [place]  [address] Phone: [phone] Fax: [phone]

## 2014-09-18 ENCOUNTER — Telehealth: Payer: Self-pay | Admitting: Neurology

## 2014-09-18 NOTE — Telephone Encounter (Signed)
Reached husband on mobile line because the home line is down due to the power being out. He states he will have the patient call me to schedule botox appointment

## 2014-10-02 ENCOUNTER — Encounter: Payer: Self-pay | Admitting: *Deleted

## 2014-10-03 ENCOUNTER — Ambulatory Visit (INDEPENDENT_AMBULATORY_CARE_PROVIDER_SITE_OTHER): Payer: Medicare Other | Admitting: Neurology

## 2014-10-03 ENCOUNTER — Encounter: Payer: Self-pay | Admitting: Neurology

## 2014-10-03 VITALS — BP 117/79 | HR 74 | Ht 62.5 in | Wt 150.0 lb

## 2014-10-03 DIAGNOSIS — G8112 Spastic hemiplegia affecting left dominant side: Secondary | ICD-10-CM | POA: Diagnosis not present

## 2014-10-03 MED ORDER — ONABOTULINUMTOXINA 100 UNITS IJ SOLR
200.0000 [IU] | Freq: Once | INTRAMUSCULAR | Status: AC
  Administered 2014-10-03: 200 [IU] via INTRAMUSCULAR

## 2014-10-03 NOTE — Progress Notes (Addendum)
[facility]  CC:  Spastic hemiplegia  [name] is a 64  y.o. Right-handed Caucasian female, return for EMG guided Botox injection for her left spastic hemiparesis,  She suffered stroke in 1999, with residual left spastic hemiparesis, upper extremity more than leg, left visual field cut  She previously was enrolled in Botox research study for spastic upper extremity at [facility] by Dr. Marya Fossa, received EMG guided Botox injection in 2010 for about 2 years, with good benefit, but has difficulty with insurance  She began to receive Botox injection through our clinic by Dr. Hosie Poisson since May 2015,150 units total to left upper extremity, which has been very helpful  She is return for continued EMG guided Botox injection, at baseline, she can ambulate with a left AFO, but without assistance,  Profound left upper extremity spasticity, achiness, chronic pain, persistent left wrist flexion, finger flexion,  She also has a history of coronary artery disease in 2007, taking Plavix, and Aggrenox, exercise regularly, has personal trainer  UPDATE March 2nd 2016: She received 300 units in Jun 13 2014, to left upper extremity, which has been very helpful, she did not notice significant side effect. The injection help her open her left hand better, no significant side effect noticed.  History   Social History  . Marital Status: Married    Spouse Name: N/A  . Number of Children: N/A  . Years of Education: N/A   Occupational History  . disabled    Social History Main Topics  . Smoking status: Former Games developer  . Smokeless tobacco: Never Used     Comment: Quit in 2005  . Alcohol Use: Yes     Comment: social  . Drug Use: No  . Sexual Activity: Not Currently   Other Topics Concern  . Not on file   Social History Narrative   Married, no children   Right handed   12th grade   1 cup daily    Family History  Problem Relation Age of Onset  . Arrhythmia Father       PPM    Past Medical History  Diagnosis Date  . Unstable angina   . Hypothyroidism   . CVA (cerebral vascular accident) 1999  . AMI (acute myocardial infarction) 2007    failed PCI to the distal LAD - managed medically    Past Surgical History  Procedure Laterality Date  . Endometrial ablation  3/06    Current Outpatient Prescriptions  Medication Sig Dispense Refill  . ALPRAZolam (XANAX) 0.5 MG tablet Take 0.5 mg by mouth 2 (two) times daily as needed.      . Aspirin-Dipyridamole (AGGRENOX PO) Take 25 mg by mouth.    . botulinum toxin Type A (BOTOX) 100 UNITS SOLR injection Inject 200 Units into the muscle once.    . cetirizine (ZYRTEC) 10 MG tablet Take 10 mg by mouth daily.      . clopidogrel (PLAVIX) 75 MG tablet Take 1 tablet (75 mg total) by mouth daily. 90 tablet 3  . diphenhydrAMINE (BENADRYL) 25 MG tablet Take 25 mg by mouth every 6 (six) hours as needed.      Marland Kitchen FLUoxetine (PROZAC) 20 MG capsule Take 20 mg by mouth daily.      Marland Kitchen gabapentin (NEURONTIN) 300 MG capsule Take 300 mg by mouth Daily.    Marland Kitchen lactulose, encephalopathy, (CHRONULAC) 10 GM/15ML SOLN Take 10 g by mouth 2 (two) times daily.     Marland Kitchen levothyroxine (SYNTHROID, LEVOTHROID) 88 MCG tablet  Take 88 mcg by mouth daily.      Marland Kitchen LINZESS 145 MCG CAPS capsule Take 145 mcg by mouth daily.     . nitroGLYCERIN (NITROSTAT) 0.4 MG SL tablet Place 1 tablet (0.4 mg total) under the tongue every 5 (five) minutes as needed. 25 tablet 3  . pantoprazole (PROTONIX) 40 MG tablet Take 40 mg by mouth daily.      . polyethylene glycol (MIRALAX / GLYCOLAX) packet Take 17 g by mouth daily.      . ranolazine (RANEXA) 500 MG 12 hr tablet Take 500 mg by mouth as needed.      Marland Kitchen rOPINIRole (REQUIP) 0.5 MG tablet Take 0.5 mg by mouth Daily.    . simvastatin (ZOCOR) 40 MG tablet Take 40 mg by mouth at bedtime.      . tapentadol (NUCYNTA) 50 MG TABS tablet Take 50 mg by mouth every 4 (four) hours as needed.     Current Facility-Administered  Medications  Medication Dose Route Frequency Provider Last Rate Last Dose  . botulinum toxin Type A (BOTOX) injection 200 Units  200 Units Intramuscular Once Omelia Blackwater, DO        Allergies as of 10/03/2014 - Review Complete 10/03/2014  Allergen Reaction Noted  . Aspirin  08/18/2012  . Morphine  01/01/2009  . Penicillins  01/01/2009  . Zolpidem tartrate  01/01/2009    Vitals: BP 117/79 mmHg  Pulse 74  Ht 5' 2.5" (1.588 m)  Wt 150 lb (68.04 kg)  BMI 26.98 kg/m2 Last Weight:  Wt Readings from Last 1 Encounters:  10/03/14 150 lb (68.04 kg)   Last Height:   Ht Readings from Last 1 Encounters:  10/03/14 5' 2.5" (1.588 m)   PHYSICAL EXAMINATOINS:  Generalized: In no acute distress  Neck: Supple, no carotid bruits   Cardiac: Regular rate rhythm  Pulmonary: Clear to auscultation bilaterally  Musculoskeletal: No deformity  Neurological examination  Mentation: Alert oriented to time, place, history taking, and causual conversation, left lower face weakness  Cranial nerve II-XII: Pupils were equal round reactive to light extraocular movements were full, left visual field cut.   Left lower face weakness. hearing was intact to finger rubbing bilaterally. Uvula tongue midline.  head turning and shoulder shrug and were normal and symmetric.Tongue protrusion into cheek strength was normal.  Motor: spastic left hemiparesis,  Wearing left AFO, left lower extremity moderate spasticity, left hip flexion 4, knee flexion 4, extension 5, Profound left upper extremity spasticity, left shoulder adduction, internal rotation,.left elbow flexion, pronation, with passive movement, mild fixed contraction of left elbow, maximum 175, left wrist forceful flexion, finger flexion,thumb in position, even with forceful movement, she could not achieve full extension of her left finger  Gait: Rising up from seated position by pushing on chair arm,spastic left hemi-circumferential gait.  Deep  tendon reflexes: hyperreflexia of left side   Assessment and plan:   64 years old right-handed Caucasian female, status post stroke, with spastic left hemiparesis since 1999, responded very well to previous EMG guided Botox injection, return to clinic for repeat injection  Under EMG guidance, 300 units of Botox was injected into left upper extremity muscles  Left pectoralis major 25 units Left biceps 25 units Left brachialis 25 unitsx2=50  Left pronator teres 25 units  Left  Flexor carpi ulnaris 25 units Left flexor digitorum profundus 25 units  Left lumbrical 25  Left palmaris longus 25 units     She tolerated the injection well,Return to clinic in 3  months for repeat injection  Atiba Kimberlin, M.D. Ph.D.  [facility] [address] [place], [place] [address] Phone: [phone] Fax:      [phone]

## 2014-10-10 ENCOUNTER — Other Ambulatory Visit: Payer: Self-pay

## 2014-10-10 DIAGNOSIS — Z1231 Encounter for screening mammogram for malignant neoplasm of breast: Secondary | ICD-10-CM

## 2014-10-11 MED ORDER — ONABOTULINUMTOXINA 100 UNITS IJ SOLR
200.0000 [IU] | Freq: Once | INTRAMUSCULAR | Status: AC
  Administered 2014-10-11: 200 [IU] via INTRAMUSCULAR

## 2014-11-16 ENCOUNTER — Ambulatory Visit
Admission: RE | Admit: 2014-11-16 | Discharge: 2014-11-16 | Disposition: A | Discharge status: Home / Self Care | Payer: Medicare Other | Source: Ambulatory Visit

## 2014-11-16 DIAGNOSIS — Z1231 Encounter for screening mammogram for malignant neoplasm of breast: Secondary | ICD-10-CM

## 2015-01-02 ENCOUNTER — Ambulatory Visit: Payer: Medicare Other | Admitting: Neurology

## 2015-01-08 ENCOUNTER — Encounter: Payer: Self-pay | Admitting: *Deleted

## 2015-01-08 DIAGNOSIS — G8114 Spastic hemiplegia affecting left nondominant side: Secondary | ICD-10-CM

## 2015-01-09 ENCOUNTER — Ambulatory Visit (INDEPENDENT_AMBULATORY_CARE_PROVIDER_SITE_OTHER): Payer: Medicare Other | Admitting: Neurology

## 2015-01-09 ENCOUNTER — Encounter: Payer: Self-pay | Admitting: Neurology

## 2015-01-09 VITALS — BP 125/82 | HR 64 | Ht 62.5 in | Wt 150.0 lb

## 2015-01-09 DIAGNOSIS — G8114 Spastic hemiplegia affecting left nondominant side: Secondary | ICD-10-CM

## 2015-01-09 NOTE — Progress Notes (Signed)
[facility]  CC:  Spastic hemiplegia  [name] is a 64  y.o. Right-handed Caucasian female, return for EMG guided Botox injection for her left spastic hemiparesis,  She suffered stroke in 1999, with residual left spastic hemiparesis, upper extremity more than leg, left visual field cut  She previously was enrolled in Botox research study for spastic upper extremity [facility] by Dr. Marya FossaBrashear, received EMG guided Botox injection in 2010 for about 2 years, with good benefit, but has difficulty with insurance  She began to receive Botox injection through our clinic by Dr. Hosie PoissonSumner since May 2015,150 units total to left upper extremity, which has been very helpful  She is return for continued EMG guided Botox injection, at baseline, she can ambulate with a left AFO, but without assistance,  Profound left upper extremity spasticity, achiness, chronic pain, persistent left wrist flexion, finger flexion,  She also has a history of coronary artery disease in 2007, taking Plavix, and Aggrenox, exercise regularly, has personal trainer  UPDATE March 2nd 2016: She received 300 units in Jun 13 2014, to left upper extremity, which has been very helpful, she did not notice significant side effect. The injection help her open her left hand better, no significant side effect noticed.  UPDATE June 8th 2016: She received 300 units of Botox a in March second 2016, responded very well, she was able to relax her left hand better, she ambulate without assistance with spastic left hemiparesis, complains of left facial, left foot pain, wants the injection to emphasize on her left finger flexion wrist flexion today.    History   Social History  . Marital Status: Married    Spouse Name: N/A  . Number of Children: N/A  . Years of Education: N/A   Occupational History  . disabled    Social History Main Topics  . Smoking status: Former Games developermoker  . Smokeless tobacco: Never Used       Comment: Quit in 2005  . Alcohol Use: Yes     Comment: social  . Drug Use: No  . Sexual Activity: Not Currently   Other Topics Concern  . Not on file   Social History Narrative   Married, no children   Right handed   12th grade   1 cup daily    Family History  Problem Relation Age of Onset  . Arrhythmia Father     PPM    Past Medical History  Diagnosis Date  . Unstable angina   . Hypothyroidism   . CVA (cerebral vascular accident) 1999  . AMI (acute myocardial infarction) 2007    failed PCI to the distal LAD - managed medically    Past Surgical History  Procedure Laterality Date  . Endometrial ablation  3/06    Current Outpatient Prescriptions  Medication Sig Dispense Refill  . ALPRAZolam (XANAX) 0.5 MG tablet Take 0.5 mg by mouth 2 (two) times daily as needed.      . botulinum toxin Type A (BOTOX) 100 UNITS SOLR injection Inject 300 Units into the muscle once.     . cetirizine (ZYRTEC) 10 MG tablet Take 10 mg by mouth daily.      . clopidogrel (PLAVIX) 75 MG tablet Take 1 tablet (75 mg total) by mouth daily. 90 tablet 3  . diphenhydrAMINE (BENADRYL) 25 MG tablet Take 25 mg by mouth every 6 (six) hours as needed.      . dipyridamole-aspirin (AGGRENOX) 200-25 MG per 12 hr capsule Take 1 capsule  by mouth 2 (two) times daily.    Marland Kitchen FLUoxetine (PROZAC) 20 MG capsule Take 20 mg by mouth daily.      Marland Kitchen gabapentin (NEURONTIN) 300 MG capsule Take 300 mg by mouth Daily.    Marland Kitchen lactulose, encephalopathy, (CHRONULAC) 10 GM/15ML SOLN Take 10 g by mouth 2 (two) times daily.     Marland Kitchen levothyroxine (SYNTHROID, LEVOTHROID) 88 MCG tablet Take 88 mcg by mouth daily.      Marland Kitchen LINZESS 145 MCG CAPS capsule Take 145 mcg by mouth daily.     . nitroGLYCERIN (NITROSTAT) 0.4 MG SL tablet Place 1 tablet (0.4 mg total) under the tongue every 5 (five) minutes as needed. 25 tablet 3  . pantoprazole (PROTONIX) 40 MG tablet Take 40 mg by mouth daily.      . ranolazine (RANEXA) 500 MG 12 hr tablet Take  500 mg by mouth as needed.      Marland Kitchen rOPINIRole (REQUIP) 0.5 MG tablet Take 0.5 mg by mouth Daily.    . simvastatin (ZOCOR) 40 MG tablet Take 40 mg by mouth at bedtime.      . tapentadol (NUCYNTA) 50 MG TABS tablet Take 50 mg by mouth every 4 (four) hours as needed.     No current facility-administered medications for this visit.    Allergies as of 01/09/2015 - Review Complete 01/09/2015  Allergen Reaction Noted  . Aspirin  08/18/2012  . Morphine  01/01/2009  . Penicillins  01/01/2009  . Zolpidem tartrate  01/01/2009    Vitals: BP 125/82 mmHg  Pulse 64  Ht 5' 2.5" (1.588 m)  Wt 150 lb (68.04 kg)  BMI 26.98 kg/m2 Last Weight:  Wt Readings from Last 1 Encounters:  01/09/15 150 lb (68.04 kg)   Last Height:   Ht Readings from Last 1 Encounters:  01/09/15 5' 2.5" (1.588 m)   PHYSICAL EXAMINATOINS:  Generalized: In no acute distress  Neck: Supple, no carotid bruits   Cardiac: Regular rate rhythm  Pulmonary: Clear to auscultation bilaterally  Musculoskeletal: No deformity  Neurological examination  Mentation: Alert oriented to time, place, history taking, and causual conversation, left lower face weakness  Cranial nerve II-XII: Pupils were equal round reactive to light extraocular movements were full, left visual field cut.   Left lower face weakness. hearing was intact to finger rubbing bilaterally. Uvula tongue midline.  head turning and shoulder shrug and were normal and symmetric.Tongue protrusion into cheek strength was normal.  Motor: spastic left hemiparesis,  Wearing left AFO, left lower extremity moderate spasticity, left hip flexion 4, knee flexion 4, extension 5, Profound left upper extremity spasticity, left shoulder adduction, internal rotation.left elbow flexion, pronation, with passive movement, mild fixed contraction of left elbow, maximum 175, left wrist forceful flexion, finger flexion,thumb in position, even with forceful movement, she could not achieve full  extension of her left finger  Gait: Rising up from seated position by pushing on chair arm,spastic left hemi-circumferential gait.  Deep tendon reflexes: hyperreflexia of left side   Assessment and plan:   64 years old right-handed Caucasian female, status post stroke, with spastic left hemiparesis since 1999, responded very well to previous EMG guided Botox injection, return to clinic for repeat injection  Under electric stimulation, 300 units of Botox was injected into left upper extremity muscles (100 units of BOTOX A/2 cc of NS)     Left pronator teres 25 x2 =50 units Left flexor carpi radialis 50 units Left  Flexor carpi ulnaris 25 x2=50 units Left flexor digitorum  profundus 25x2=50 units Left lumbricals, divided into 3 injection sites 50 units  Left palmaris longus 25 x2=50 units   She tolerated the injection well,Return to clinic in 3 months for repeat injection  [name], M.D. Ph.D.  [facility] [address] [place], [place] [address] Phone: [phone] Fax:      [phone]

## 2015-01-09 NOTE — Progress Notes (Signed)
**  Botox [id number], exp 08/2017**mck,rn

## 2015-01-10 ENCOUNTER — Encounter: Payer: Self-pay | Admitting: *Deleted

## 2015-01-14 ENCOUNTER — Encounter: Payer: Self-pay | Admitting: *Deleted

## 2015-01-29 ENCOUNTER — Telehealth: Payer: Self-pay | Admitting: Neurology

## 2015-01-29 ENCOUNTER — Telehealth: Payer: Self-pay

## 2015-01-29 NOTE — Telephone Encounter (Signed)
[facility]

## 2015-01-29 NOTE — Telephone Encounter (Signed)
Returned call to patient in regards to phone message she left regarding her regular medication shipped to the office in error. Per the patient her husband will not be able to pick them up today. Advised the patient that is okay and that he just needs to ask the front desk for the medication when he comes.

## 2015-01-29 NOTE — Telephone Encounter (Signed)
Spoke to patient. Advised did not receive Botox as of yet, but did receive regular meds. Patient said the pharmacy shipped incorrectly and will try to have spouse to come by and pick them up.

## 2015-03-28 ENCOUNTER — Telehealth: Payer: Self-pay | Admitting: Neurology

## 2015-04-01 ENCOUNTER — Telehealth: Payer: Self-pay | Admitting: Neurology

## 2015-04-01 NOTE — Telephone Encounter (Signed)
Returned the patients call regarding rescheduling her botox apt. Scheduled apt for 05/01/15.

## 2015-04-16 NOTE — Telephone Encounter (Signed)
Error

## 2015-04-24 ENCOUNTER — Ambulatory Visit: Payer: Self-pay | Admitting: Neurology

## 2015-04-25 NOTE — Telephone Encounter (Signed)
Spoke to patient. Advised Lucile Crater is trying to contact her to schedule Botox shipment. Provided# [phone].

## 2015-04-25 NOTE — Telephone Encounter (Signed)
This encounter was created in error - please disregard.

## 2015-05-01 ENCOUNTER — Ambulatory Visit (INDEPENDENT_AMBULATORY_CARE_PROVIDER_SITE_OTHER): Payer: Medicare Other | Admitting: Neurology

## 2015-05-01 ENCOUNTER — Encounter: Payer: Self-pay | Admitting: Neurology

## 2015-05-01 VITALS — BP 119/83 | HR 76 | Ht 62.5 in | Wt 144.0 lb

## 2015-05-01 DIAGNOSIS — G8114 Spastic hemiplegia affecting left nondominant side: Secondary | ICD-10-CM | POA: Diagnosis not present

## 2015-05-01 NOTE — Progress Notes (Signed)
[facility]  CC:  Spastic hemiplegia  [name] is a 64  y.o. Right-handed Caucasian female, return for EMG guided Botox injection for her left spastic hemiparesis,  She suffered stroke in 1999, with residual left spastic hemiparesis, upper extremity more than leg, left visual field cut  She previously was enrolled in Botox research study for spastic upper extremity at [facility] by Dr. Marya Fossa, received EMG guided Botox injection in 2010 for about 2 years, with good benefit, but has difficulty with insurance  She began to receive Botox injection through our clinic by Dr. Hosie Poisson since May 2015,150 units total to left upper extremity, which has been very helpful  She is return for continued EMG guided Botox injection, at baseline, she can ambulate with a left AFO, but without assistance,  Profound left upper extremity spasticity, achiness, chronic pain, persistent left wrist flexion, finger flexion,  She also has a history of coronary artery disease in 2007, taking Plavix, and Aggrenox, exercise regularly, has personal trainer  UPDATE March 2nd 2016: She received 300 units in Jun 13 2014, to left upper extremity, which has been very helpful, she did not notice significant side effect. The injection help her open her left hand better, no significant side effect noticed.  UPDATE June 8th 2016: She received 300 units of Botox a in March second 2016, responded very well, she was able to relax her left hand better, she ambulate without assistance with spastic left hemiparesis, complains of left facial, left foot pain, wants the injection to emphasize on her left finger flexion wrist flexion today.  UPDATE Sep 28th 2016: She responded to previous Botox injection very well in June, she wants to emphasize on her left upper extremity at this time, complains forceful finger contraction, flexion, elbow flexion. Only mild left shoulder pain with passive  movement.   Social History   Social History  . Marital Status: Married    Spouse Name: N/A  . Number of Children: N/A  . Years of Education: N/A   Occupational History  . disabled    Social History Main Topics  . Smoking status: Former Games developer  . Smokeless tobacco: Never Used     Comment: Quit in 2005  . Alcohol Use: Yes     Comment: social  . Drug Use: No  . Sexual Activity: Not Currently   Other Topics Concern  . Not on file   Social History Narrative   Married, no children   Right handed   12th grade   1 cup daily    Family History  Problem Relation Age of Onset  . Arrhythmia Father     PPM    Past Medical History  Diagnosis Date  . Unstable angina   . Hypothyroidism   . CVA (cerebral vascular accident) 1999  . AMI (acute myocardial infarction) 2007    failed PCI to the distal LAD - managed medically  . Degenerative disc disease, cervical     Past Surgical History  Procedure Laterality Date  . Endometrial ablation  3/06    Current Outpatient Prescriptions  Medication Sig Dispense Refill  . ALPRAZolam (XANAX) 0.5 MG tablet Take 0.5 mg by mouth 2 (two) times daily as needed.      . botulinum toxin Type A (BOTOX) 100 UNITS SOLR injection Inject 300 Units into the muscle once. G81.4. No PA required. Specialty Pharmacy.    . cetirizine (ZYRTEC) 10 MG tablet Take 10 mg by mouth daily.      Marland Kitchen  clopidogrel (PLAVIX) 75 MG tablet Take 1 tablet (75 mg total) by mouth daily. 90 tablet 3  . diphenhydrAMINE (BENADRYL) 25 MG tablet Take 25 mg by mouth every 6 (six) hours as needed.      . dipyridamole-aspirin (AGGRENOX) 200-25 MG per 12 hr capsule Take 1 capsule by mouth 2 (two) times daily.    Marland Kitchen FLUoxetine (PROZAC) 20 MG capsule Take 20 mg by mouth daily.      Marland Kitchen gabapentin (NEURONTIN) 300 MG capsule Take 300 mg by mouth Daily.    Marland Kitchen lactulose, encephalopathy, (CHRONULAC) 10 GM/15ML SOLN Take 10 g by mouth 2 (two) times daily.     Marland Kitchen levothyroxine (SYNTHROID,  LEVOTHROID) 88 MCG tablet Take 88 mcg by mouth daily.      Marland Kitchen LINZESS 145 MCG CAPS capsule Take 145 mcg by mouth daily.     . nitroGLYCERIN (NITROSTAT) 0.4 MG SL tablet Place 1 tablet (0.4 mg total) under the tongue every 5 (five) minutes as needed. 25 tablet 3  . pantoprazole (PROTONIX) 40 MG tablet Take 40 mg by mouth daily.      . ranolazine (RANEXA) 500 MG 12 hr tablet Take 500 mg by mouth as needed.      Marland Kitchen rOPINIRole (REQUIP) 0.5 MG tablet Take 0.5 mg by mouth Daily.    . simvastatin (ZOCOR) 40 MG tablet Take 40 mg by mouth at bedtime.      . tapentadol (NUCYNTA) 50 MG TABS tablet Take 50 mg by mouth every 4 (four) hours as needed.     No current facility-administered medications for this visit.    Allergies as of 05/01/2015 - Review Complete 05/01/2015  Allergen Reaction Noted  . Aspirin  08/18/2012  . Morphine  01/01/2009  . Penicillins  01/01/2009  . Zolpidem tartrate  01/01/2009    Vitals: BP 119/83 mmHg  Pulse 76  Ht 5' 2.5" (1.588 m)  Wt 144 lb (65.318 kg)  BMI 25.90 kg/m2 Last Weight:  Wt Readings from Last 1 Encounters:  05/01/15 144 lb (65.318 kg)   Last Height:   Ht Readings from Last 1 Encounters:  05/01/15 5' 2.5" (1.588 m)   PHYSICAL EXAMINATOINS:  Generalized: In no acute distress  Neck: Supple, no carotid bruits   Cardiac: Regular rate rhythm  Pulmonary: Clear to auscultation bilaterally  Musculoskeletal: No deformity  Neurological examination  Mentation: Alert oriented to time, place, history taking, and causual conversation, left lower face weakness  Cranial nerve II-XII: Pupils were equal round reactive to light extraocular movements were full, left visual field cut.   Left lower face weakness. hearing was intact to finger rubbing bilaterally. Uvula tongue midline.  head turning and shoulder shrug and were normal and symmetric.Tongue protrusion into cheek strength was normal.  Motor: spastic left hemiparesis,  Wearing left AFO, left lower  extremity moderate spasticity, left hip flexion 4, knee flexion 4, extension 5, Profound left upper extremity spasticity, left shoulder adduction, internal rotation.left elbow flexion, pronation, with passive movement, mild fixed contraction of left elbow, maximum 175, left wrist forceful flexion, finger flexion,thumb in position, even with forceful movement, she could not achieve full extension of her left finger  Gait: Rising up from seated position by pushing on chair arm,spastic left hemi-circumferential gait.  Deep tendon reflexes: hyperreflexia of left side   Assessment and plan:   64 years old right-handed Caucasian female, status post stroke, with spastic left hemiparesis since 1999, responded very well to previous EMG guided Botox injection, return to clinic for repeat  injection  Under electric stimulation, 400 units of Botox was injected into left upper extremity muscles  Left pronator teres 25 x2 =50 units Left flexor carpi radialis 50 units Left  Flexor carpi ulnaris 25 x2=50 units Left flexor digitorum profundus 25x2=50 units Left lumbricals, divided into 3 injection sites 25 units  Left palmaris longus 25 x2=50 units  Left brachialis 50 units Left pectoralis major 50 units (Major 25 units  She tolerated the injection well,Return to clinic in 3 months for repeat injection  [name], M.D. Ph.D.  [facility] [address] [place], [place] [address] Phone: [phone] Fax:      [phone]

## 2015-05-01 NOTE — Progress Notes (Signed)
**  Botox 100 units x 4, Lot [id number], Exp 12/2017, NDC 0023-1145-01, Specialty Pharmacy**mck, rn. 

## 2015-07-09 NOTE — Telephone Encounter (Signed)
Called patient to tell her SP need permission to ship botox. Gave her the number to call them.

## 2015-07-31 ENCOUNTER — Ambulatory Visit (INDEPENDENT_AMBULATORY_CARE_PROVIDER_SITE_OTHER): Payer: Medicare Other | Admitting: Neurology

## 2015-07-31 VITALS — BP 115/78 | HR 77 | Resp 16 | Ht 62.5 in | Wt 141.0 lb

## 2015-07-31 DIAGNOSIS — G8114 Spastic hemiplegia affecting left nondominant side: Secondary | ICD-10-CM

## 2015-07-31 DIAGNOSIS — I6350 Cerebral infarction due to unspecified occlusion or stenosis of unspecified cerebral artery: Secondary | ICD-10-CM

## 2015-07-31 NOTE — Progress Notes (Signed)
[facility]  CC:  Spastic hemiplegia  [name] is a 64  y.o. Right-handed Caucasian female, return for EMG guided Botox injection for her left spastic hemiparesis,  She suffered stroke in 1999, with residual left spastic hemiparesis, upper extremity more than leg, left visual field cut  She previously was enrolled in Botox research study for spastic upper extremity at [facility] by Dr. Marya Fossa, received EMG guided Botox injection in 2010 for about 2 years, with good benefit, but has difficulty with insurance  She began to receive Botox injection through our clinic by Dr. Hosie Poisson since May 2015,150 units total to left upper extremity, which has been very helpful  She is return for continued EMG guided Botox injection, at baseline, she can ambulate with a left AFO, but without assistance,  Profound left upper extremity spasticity, achiness, chronic pain, persistent left wrist flexion, finger flexion,  She also has a history of coronary artery disease in 2007, taking Plavix, and Aggrenox, exercise regularly, has personal trainer  UPDATE March 2nd 2016: She received 300 units in Jun 13 2014, to left upper extremity, which has been very helpful, she did not notice significant side effect. The injection help her open her left hand better, no significant side effect noticed.  UPDATE June 8th 2016: She received 300 units of Botox a in March second 2016, responded very well, she was able to relax her left hand better, she ambulate without assistance with spastic left hemiparesis, complains of left facial, left foot pain, wants the injection to emphasize on her left finger flexion wrist flexion today.  UPDATE Sep 28th 2016: She responded to previous Botox injection very well in June, she wants to emphasize on her left upper extremity at this time, complains forceful finger contraction, flexion, elbow flexion. Only mild left shoulder pain with passive movement.  Update  July 31 2015: She responded well to previous injection in September 2016, received 400 units to left upper extremity, she complains of forceful left finger contraction,  Social History   Social History  . Marital Status: Married    Spouse Name: N/A  . Number of Children: N/A  . Years of Education: N/A   Occupational History  . disabled    Social History Main Topics  . Smoking status: Former Games developer  . Smokeless tobacco: Never Used     Comment: Quit in 2005  . Alcohol Use: Yes     Comment: social  . Drug Use: No  . Sexual Activity: Not Currently   Other Topics Concern  . Not on file   Social History Narrative   Married, no children   Right handed   12th grade   1 cup daily    Family History  Problem Relation Age of Onset  . Arrhythmia Father     PPM    Past Medical History  Diagnosis Date  . Unstable angina   . Hypothyroidism   . CVA (cerebral vascular accident) 1999  . AMI (acute myocardial infarction) 2007    failed PCI to the distal LAD - managed medically  . Degenerative disc disease, cervical     Past Surgical History  Procedure Laterality Date  . Endometrial ablation  3/06    Current Outpatient Prescriptions  Medication Sig Dispense Refill  . ALPRAZolam (XANAX) 0.5 MG tablet Take 0.5 mg by mouth 2 (two) times daily as needed.      . botulinum toxin Type A (BOTOX) 100 UNITS SOLR injection Inject 300 Units into the muscle  once. G81.4. No PA required. Specialty Pharmacy.    . cetirizine (ZYRTEC) 10 MG tablet Take 10 mg by mouth daily.      . clopidogrel (PLAVIX) 75 MG tablet Take 1 tablet (75 mg total) by mouth daily. 90 tablet 3  . diphenhydrAMINE (BENADRYL) 25 MG tablet Take 25 mg by mouth every 6 (six) hours as needed.      . dipyridamole-aspirin (AGGRENOX) 200-25 MG per 12 hr capsule Take 1 capsule by mouth 2 (two) times daily.    Marland Kitchen. FLUoxetine (PROZAC) 20 MG capsule Take 20 mg by mouth daily.      Marland Kitchen. gabapentin (NEURONTIN) 300 MG capsule Take 300  mg by mouth Daily.    Marland Kitchen. lactulose, encephalopathy, (CHRONULAC) 10 GM/15ML SOLN Take 10 g by mouth 2 (two) times daily.     Marland Kitchen. levothyroxine (SYNTHROID, LEVOTHROID) 88 MCG tablet Take 88 mcg by mouth daily.      Marland Kitchen. LINZESS 145 MCG CAPS capsule Take 145 mcg by mouth daily.     . nitroGLYCERIN (NITROSTAT) 0.4 MG SL tablet Place 1 tablet (0.4 mg total) under the tongue every 5 (five) minutes as needed. 25 tablet 3  . pantoprazole (PROTONIX) 40 MG tablet Take 40 mg by mouth daily.      . ranolazine (RANEXA) 500 MG 12 hr tablet Take 500 mg by mouth as needed.      Marland Kitchen. rOPINIRole (REQUIP) 0.5 MG tablet Take 0.5 mg by mouth Daily.    . simvastatin (ZOCOR) 40 MG tablet Take 40 mg by mouth at bedtime.      . tapentadol (NUCYNTA) 50 MG TABS tablet Take 50 mg by mouth every 4 (four) hours as needed.     No current facility-administered medications for this visit.    Allergies as of 07/31/2015 - Review Complete 07/31/2015  Allergen Reaction Noted  . Aspirin  08/18/2012  . Morphine  01/01/2009  . Penicillins  01/01/2009  . Zolpidem tartrate  01/01/2009    Vitals: BP 115/78 mmHg  Pulse 77  Resp 16  Ht 5' 2.5" (1.588 m)  Wt 141 lb (63.957 kg)  BMI 25.36 kg/m2 Last Weight:  Wt Readings from Last 1 Encounters:  07/31/15 141 lb (63.957 kg)   Last Height:   Ht Readings from Last 1 Encounters:  07/31/15 5' 2.5" (1.588 m)   PHYSICAL EXAMINATOINS:  Generalized: In no acute distress  Neck: Supple, no carotid bruits   Cardiac: Regular rate rhythm  Pulmonary: Clear to auscultation bilaterally  Musculoskeletal: No deformity  Neurological examination  Mentation: Alert oriented to time, place, history taking, and causual conversation, left lower face weakness  Cranial nerve II-XII: Pupils were equal round reactive to light extraocular movements were full, left visual field cut.   Left lower face weakness. hearing was intact to finger rubbing bilaterally. Uvula tongue midline.  head turning and  shoulder shrug and were normal and symmetric.Tongue protrusion into cheek strength was normal.  Motor: spastic left hemiparesis,  Wearing left AFO, left lower extremity moderate spasticity, left hip flexion 4, knee flexion 4, extension 5, Profound left upper extremity spasticity, left shoulder adduction, internal rotation.left elbow flexion, pronation, with passive movement, mild fixed contraction of left elbow, maximum 175, left wrist forceful flexion, finger flexion,thumb in position, even with forceful movement, she could not achieve full extension of her left finger  Gait: Rising up from seated position by pushing on chair arm,spastic left hemi-circumferential gait.  Deep tendon reflexes: hyperreflexia of left side   Assessment and plan:  64 years old right-handed Caucasian female, status post stroke, with spastic left hemiparesis since 1999, responded very well to previous EMG guided Botox injection, return to clinic for repeat injection  Under electric stimulation, 400 units of Botox was injected into left upper extremity muscles  Left pronator teres 25 x2 =50 units Left flexor carpi radialis 50 units Left  Flexor carpi ulnaris 25 x2=50 units Left flexor digitorum profundus 25x2=50 units Left lumbricals, divided into 3 injection sites 25 units  Left palmaris longus 25 x2=50 units  Left brachialis 75 units Left pectoralis major 50 units   She tolerated the injection well,Return to clinic in 3 months for repeat injection  [name], M.D. Ph.D.  [facility] [address] [place], [place] [address] Phone: [phone] Fax:      [phone]

## 2015-08-15 DIAGNOSIS — Z01419 Encounter for gynecological examination (general) (routine) without abnormal findings: Secondary | ICD-10-CM | POA: Diagnosis not present

## 2015-08-15 DIAGNOSIS — Z6825 Body mass index (BMI) 25.0-25.9, adult: Secondary | ICD-10-CM | POA: Diagnosis not present

## 2015-08-23 ENCOUNTER — Ambulatory Visit: Payer: Medicare Other | Admitting: Nurse Practitioner

## 2015-08-28 ENCOUNTER — Encounter: Payer: Self-pay | Admitting: Nurse Practitioner

## 2015-08-28 ENCOUNTER — Ambulatory Visit (INDEPENDENT_AMBULATORY_CARE_PROVIDER_SITE_OTHER): Payer: Medicare HMO | Admitting: Nurse Practitioner

## 2015-08-28 VITALS — BP 112/70 | HR 73 | Ht 62.0 in | Wt 143.1 lb

## 2015-08-28 DIAGNOSIS — I259 Chronic ischemic heart disease, unspecified: Secondary | ICD-10-CM

## 2015-08-28 DIAGNOSIS — E785 Hyperlipidemia, unspecified: Secondary | ICD-10-CM | POA: Diagnosis not present

## 2015-08-28 MED ORDER — RANOLAZINE ER 500 MG PO TB12: 500.0000 mg | ORAL_TABLET | ORAL | Status: DC | PRN

## 2015-08-28 MED ORDER — NITROGLYCERIN 0.4 MG SL SUBL: 0.4000 mg | SUBLINGUAL_TABLET | SUBLINGUAL | Status: DC | PRN

## 2015-08-28 NOTE — Patient Instructions (Addendum)
We will be checking the following labs today - NONE   Medication Instructions:    Continue with your current medicines.   I have refilled your NTG and your Ranexa    Testing/Procedures To Be Arranged:  Lexiscan Myoview - at [facility]  Follow-Up:   See me tentatively in one year    Other Special Instructions:   N/A    If you need a refill on your cardiac medications before your next appointment, please call your pharmacy.   Call the [facility] office at [phone] if you have any questions, problems or concerns.

## 2015-08-28 NOTE — Progress Notes (Signed)
CARDIOLOGY OFFICE NOTE  Date:  08/28/2015    [name] Date of Birth: [birth year] Medical Record #[id number]  PCP:  Selinda Flavin, MD  Cardiologist:      Chief Complaint  Patient presents with  . Coronary Artery Disease  . Hyperlipidemia    1 year check - former patient of Dr. Vern Claude    History of Present Illness: [name] is a 65 y.o. female who presents today for a one year check. She is a former patient of Dr. Vern Claude - now following by me. She has CAD with past MI in 2007 and had failed PCI to the distal LAD - managed medically - uses "PRN" Ranexa. Other issues include prior CVA from 1999, hypothyroidism, HLD and GERD.  Saw me last year and she was doing well.   Comes in today. Here alone. Has done ok but this past Sunday she had chest pain and shortness of breath - took her dose of Ranexa and it resolved. Lasted a few minutes. No NTG use. She will have this occasionally but typically does not get short of breath. She continues to go to the gym - she rides her bike. Her exercise tolerance has been stable. She continues to have significant nerve pain from remote stroke but she continues to "press on".   Past Medical History  Diagnosis Date  . Unstable angina (HCC)   . Hypothyroidism   . CVA (cerebral vascular accident) (HCC) 1999  . AMI (acute myocardial infarction) (HCC) 2007    failed PCI to the distal LAD - managed medically  . Degenerative disc disease, cervical     Past Surgical History  Procedure Laterality Date  . Endometrial ablation  3/06     Medications: Current Outpatient Prescriptions  Medication Sig Dispense Refill  . ALPRAZolam (XANAX) 0.5 MG tablet Take 0.5 mg by mouth 2 (two) times daily as needed.      . botulinum toxin Type A (BOTOX) 100 UNITS SOLR injection Inject 300 Units into the muscle once. G81.4. No PA required. Specialty Pharmacy.    . cetirizine (ZYRTEC) 10 MG tablet Take 10 mg by mouth daily.      . clopidogrel (PLAVIX) 75 MG  tablet Take 1 tablet (75 mg total) by mouth daily. 90 tablet 3  . diphenhydrAMINE (BENADRYL) 25 MG tablet Take 25 mg by mouth every 6 (six) hours as needed.      . dipyridamole-aspirin (AGGRENOX) 200-25 MG per 12 hr capsule Take 1 capsule by mouth 2 (two) times daily.    Marland Kitchen FLUoxetine (PROZAC) 20 MG capsule Take 20 mg by mouth daily.      Marland Kitchen gabapentin (NEURONTIN) 600 MG tablet Take 600 mg by mouth 2 (two) times daily.     Marland Kitchen lactulose, encephalopathy, (CHRONULAC) 10 GM/15ML SOLN Take 10 g by mouth 2 (two) times daily.     Marland Kitchen levothyroxine (SYNTHROID, LEVOTHROID) 88 MCG tablet Take 88 mcg by mouth daily.      Marland Kitchen LINZESS 145 MCG CAPS capsule Take 145 mcg by mouth daily.     . nitroGLYCERIN (NITROSTAT) 0.4 MG SL tablet Place 1 tablet (0.4 mg total) under the tongue every 5 (five) minutes as needed. 25 tablet 3  . pantoprazole (PROTONIX) 40 MG tablet Take 40 mg by mouth daily.      . ranolazine (RANEXA) 500 MG 12 hr tablet Take 1 tablet (500 mg total) by mouth as needed. 60 tablet 11  . rOPINIRole (REQUIP) 0.5 MG tablet Take  0.5 mg by mouth Daily.    . simvastatin (ZOCOR) 40 MG tablet Take 40 mg by mouth at bedtime.      . tapentadol (NUCYNTA) 50 MG TABS tablet Take 50 mg by mouth every 4 (four) hours as needed.    . Tapentadol HCl (NUCYNTA) 75 MG TABS Take 75 mg by mouth daily.     No current facility-administered medications for this visit.    Allergies: Allergies  Allergen Reactions  . Aspirin     Gastric intolerance  . Morphine   . Penicillins   . Zolpidem Tartrate     Social History: The patient  reports that she has quit smoking. She has never used smokeless tobacco. She reports that she drinks alcohol. She reports that she does not use illicit drugs.   Family History: The patient's family history includes Arrhythmia in her father.   Review of Systems: Please see the history of present illness.   Otherwise, the review of systems is positive for none.   All other systems are reviewed  and negative.   Physical Exam: VS:  BP 112/70 mmHg  Pulse 73  Ht  (1.575 m)  Wt 143 lb 1.9 oz (64.919 kg)  BMI 26.17 kg/m2  SpO2 95% .  BMI Body mass index is 26.17 kg/(m^2).  Wt Readings from Last 3 Encounters:  08/28/15 143 lb 1.9 oz (64.919 kg)  07/31/15 141 lb (63.957 kg)  05/01/15 144 lb (65.318 kg)    General: Pleasant. Well developed, well nourished and in no acute distress.  HEENT: Normal. Neck: Supple, no JVD, carotid bruits, or masses noted.  Cardiac: Regular rate and rhythm. No murmurs, rubs, or gallops. No edema.  Respiratory:  Lungs are clear to auscultation bilaterally with normal work of breathing.  GI: Soft and nontender.  MS: Left sided weakness. Gait and ROM intact. Skin: Warm and dry. Color is normal.  Neuro:  Strength and sensation are intact and no gross focal deficits noted. She has left sided weakness.  Psych: Alert, appropriate and with normal affect.   LABORATORY DATA:  EKG:  EKG is ordered today. This demonstrates NSR with nonspecific changes.  Lab Results  Component Value Date   GLUCOSE 90 09/10/2009   CHOL 164 09/10/2009   TRIG 111.0 09/10/2009   HDL 55.70 09/10/2009   LDLCALC 86 09/10/2009   ALT 23 09/10/2009   AST 24 09/10/2009   NA 140 09/10/2009   K 4.0 09/10/2009   CL 108 09/10/2009   CREATININE 0.7 09/10/2009   BUN 13 09/10/2009   CO2 30 09/10/2009    BNP (last 3 results) No results for input(s): BNP in the last 8760 hours.  ProBNP (last 3 results) No results for input(s): PROBNP in the last 8760 hours.   Other Studies Reviewed Today:  Echo Study Conclusions from 2011 Left ventricle: The cavity size was normal. Systolic function was normal. The estimated ejection fraction was in the range of 55% to 60%. Wall motion was normal; there were no regional wall motion abnormalities.   CARDIAC CATHETERIZATION 2007  HEMODYNAMICS:  1. Left main coronary artery: The left main coronary artery is free of  significant  disease.  2. Left anterior descending artery: The left anterior descending artery  gave rise to two diagonal branches and a septal perforator. The LAD was  quite tortuous in its mid portion and then had a 99% occlusion in its  distal portion with TIMI-I flow.  3. Circumflex artery: The circumflex artery gave rise to  an atrial branch,  marginal branch, and two posterolateral branches. These vessels were  free of significant disease.  4. Right coronary artery: The right coronary artery is a moderate size  vessel which gave rise to a clonus branch, right ventricular branch,  posterior descending branch, and a posterolateral branch. This vessel  was free of significant disease.  LEFT VENTRICULOGRAM: The left ventriculogram was obtained in the RAO  projection showed akinesis of the tip of the apex in the inferoapical  segment. The overall wall motion was good with an estimated ejection  fraction of 60%.  Following attempt at PCA there was persistent 99% stenosis in the distal LAD  with TIMI-I flow.  CONCLUSION:  1. Coronary artery disease, status post recent non-ST-elevation myocardial  infarction with 99% stenosis in the distal left anterior descending with  TIMI-I flow, no significant obstruction in the circumflex and right  coronary arteries, and inferoapical wall akinesis.  2. Unsuccessful percutaneous coronary intervention of the lesion in the  distal left anterior descending due to inability to cross with the wire.  DISPOSITION: The patient returned to the postanesthesia care unit for  further observation. I think her outlook should be good on medical therapy.  She has no significant residual coronary disease and overall LV function is  good.  ______________________________  Charlies Constable, M.D. [facility]  BB/MEDQ D: 08/25/2005 T: 08/25/2005 Job: [id number]   Assessment / Plan: 1. CAD - remote NSTEMI - with unsuccessful PCI to the distal LAD and no significant  residual CAD - managed medically. She will have occasional spells of angina - this last was associated with dyspnea - she usually does not have dyspnea - would like to get [employer]. Her cath was 10 years ago and given recent events, would feel better if she is able to pass Myoview. Tentatively see in one year unless Myoview is abnormal.   2. HLD - on statin - monitored by PCP  3. Prior stroke - on chronic Plavix.  4. 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   Current medicines are reviewed with the patient today.  The patient does not have concerns regarding medicines other than what has been noted above.  The following changes have been made:  See above.  Labs/ tests ordered today include:    Orders Placed This Encounter  Procedures  . Myocardial Perfusion Imaging  . EKG 12-Lead     Disposition:   FU with me in 1 year.   Patient is agreeable to this plan and will call if any problems develop in the interim.   Signed: Rosalio Macadamia, RN, ANP-C 08/28/2015 9:34 AM  [facility] [address] [place], [place]  [address] Phone: [phone] Fax: [phone]

## 2015-09-02 ENCOUNTER — Other Ambulatory Visit: Payer: Self-pay

## 2015-09-02 DIAGNOSIS — I259 Chronic ischemic heart disease, unspecified: Secondary | ICD-10-CM

## 2015-09-03 ENCOUNTER — Inpatient Hospital Stay (HOSPITAL_COMMUNITY): Admission: RE | Admit: 2015-09-03 | Payer: Medicare HMO | Source: Ambulatory Visit

## 2015-09-03 ENCOUNTER — Encounter (HOSPITAL_COMMUNITY)
Admission: RE | Admit: 2015-09-03 | Discharge: 2015-09-03 | Disposition: A | Discharge status: Home / Self Care | Payer: Medicare HMO | Source: Ambulatory Visit | Attending: Nurse Practitioner | Admitting: Nurse Practitioner

## 2015-09-03 ENCOUNTER — Encounter (HOSPITAL_COMMUNITY): Payer: Self-pay

## 2015-09-03 DIAGNOSIS — I259 Chronic ischemic heart disease, unspecified: Secondary | ICD-10-CM | POA: Insufficient documentation

## 2015-09-03 LAB — NM MYOCAR MULTI W/SPECT W/WALL MOTION / EF
LV dias vol: 34 mL
LV sys vol: 9 mL
Peak HR: 99 {beats}/min
RATE: 0.15
Rest HR: 66 {beats}/min
SDS: 6
SRS: 1
SSS: 7
TID: 1.02

## 2015-09-03 MED ORDER — TECHNETIUM TC 99M SESTAMIBI - CARDIOLITE
30.0000 | Freq: Once | INTRAVENOUS | Status: AC | PRN
  Administered 2015-09-03: 30 via INTRAVENOUS

## 2015-09-03 MED ORDER — TECHNETIUM TC 99M SESTAMIBI GENERIC - CARDIOLITE
10.0000 | Freq: Once | INTRAVENOUS | Status: AC | PRN
  Administered 2015-09-03: 10 via INTRAVENOUS

## 2015-09-03 MED ORDER — REGADENOSON 0.4 MG/5ML IV SOLN
INTRAVENOUS | Status: AC
  Administered 2015-09-03: 0.4 mg via INTRAVENOUS
  Filled 2015-09-03: qty 5

## 2015-09-03 MED ORDER — SODIUM CHLORIDE 0.9% FLUSH
INTRAVENOUS | Status: AC
Start: 2015-09-03 — End: 2015-09-03
  Administered 2015-09-03: 10 mL via INTRAVENOUS
  Filled 2015-09-03: qty 10

## 2015-09-04 ENCOUNTER — Telehealth: Payer: Self-pay | Admitting: Nurse Practitioner

## 2015-09-04 NOTE — Telephone Encounter (Signed)
°*  STAT* If patient is at the pharmacy, call can be transferred to refill team.   1. Which medications need to be refilled? (please list name of each medication and dose if known)  Ranexa  ?? mg-changing pharmacy-need new script sent 2. Which pharmacy/location (including street and city if local pharmacy) is medication to be sent to?[facility]  3. Do they need a 30 day or 90 day supply? 90 and refills

## 2015-09-04 NOTE — Telephone Encounter (Signed)
Should this be written for twice a day as needed? She wants it sent to [employer] and the sig will need to include a frequency. Please advise. Thanks, MI

## 2015-09-11 DIAGNOSIS — M5033 Other cervical disc degeneration, cervicothoracic region: Secondary | ICD-10-CM | POA: Diagnosis not present

## 2015-09-11 DIAGNOSIS — M9901 Segmental and somatic dysfunction of cervical region: Secondary | ICD-10-CM | POA: Diagnosis not present

## 2015-09-12 DIAGNOSIS — M5033 Other cervical disc degeneration, cervicothoracic region: Secondary | ICD-10-CM | POA: Diagnosis not present

## 2015-09-12 DIAGNOSIS — M9901 Segmental and somatic dysfunction of cervical region: Secondary | ICD-10-CM | POA: Diagnosis not present

## 2015-09-16 DIAGNOSIS — M9901 Segmental and somatic dysfunction of cervical region: Secondary | ICD-10-CM | POA: Diagnosis not present

## 2015-09-16 DIAGNOSIS — M5033 Other cervical disc degeneration, cervicothoracic region: Secondary | ICD-10-CM | POA: Diagnosis not present

## 2015-09-18 DIAGNOSIS — M5033 Other cervical disc degeneration, cervicothoracic region: Secondary | ICD-10-CM | POA: Diagnosis not present

## 2015-09-18 DIAGNOSIS — M9901 Segmental and somatic dysfunction of cervical region: Secondary | ICD-10-CM | POA: Diagnosis not present

## 2015-09-19 DIAGNOSIS — M9901 Segmental and somatic dysfunction of cervical region: Secondary | ICD-10-CM | POA: Diagnosis not present

## 2015-09-19 DIAGNOSIS — M5033 Other cervical disc degeneration, cervicothoracic region: Secondary | ICD-10-CM | POA: Diagnosis not present

## 2015-09-23 ENCOUNTER — Telehealth: Payer: Self-pay | Admitting: Neurology

## 2015-09-23 DIAGNOSIS — M9901 Segmental and somatic dysfunction of cervical region: Secondary | ICD-10-CM | POA: Diagnosis not present

## 2015-09-23 DIAGNOSIS — M5033 Other cervical disc degeneration, cervicothoracic region: Secondary | ICD-10-CM | POA: Diagnosis not present

## 2015-09-23 NOTE — Telephone Encounter (Signed)
Patient called and stated that her insurance information [employer] ID: [id number] [id number] Self.

## 2015-09-25 DIAGNOSIS — M9901 Segmental and somatic dysfunction of cervical region: Secondary | ICD-10-CM | POA: Diagnosis not present

## 2015-09-25 DIAGNOSIS — M5033 Other cervical disc degeneration, cervicothoracic region: Secondary | ICD-10-CM | POA: Diagnosis not present

## 2015-09-26 DIAGNOSIS — M5033 Other cervical disc degeneration, cervicothoracic region: Secondary | ICD-10-CM | POA: Diagnosis not present

## 2015-09-26 DIAGNOSIS — M9901 Segmental and somatic dysfunction of cervical region: Secondary | ICD-10-CM | POA: Diagnosis not present

## 2015-09-29 DIAGNOSIS — J020 Streptococcal pharyngitis: Secondary | ICD-10-CM | POA: Insufficient documentation

## 2015-09-30 DIAGNOSIS — J020 Streptococcal pharyngitis: Secondary | ICD-10-CM | POA: Diagnosis not present

## 2015-10-07 DIAGNOSIS — M9901 Segmental and somatic dysfunction of cervical region: Secondary | ICD-10-CM | POA: Diagnosis not present

## 2015-10-07 DIAGNOSIS — M5033 Other cervical disc degeneration, cervicothoracic region: Secondary | ICD-10-CM | POA: Diagnosis not present

## 2015-10-09 DIAGNOSIS — M9901 Segmental and somatic dysfunction of cervical region: Secondary | ICD-10-CM | POA: Diagnosis not present

## 2015-10-09 DIAGNOSIS — M5033 Other cervical disc degeneration, cervicothoracic region: Secondary | ICD-10-CM | POA: Diagnosis not present

## 2015-10-10 DIAGNOSIS — M5033 Other cervical disc degeneration, cervicothoracic region: Secondary | ICD-10-CM | POA: Diagnosis not present

## 2015-10-10 DIAGNOSIS — M9901 Segmental and somatic dysfunction of cervical region: Secondary | ICD-10-CM | POA: Diagnosis not present

## 2015-10-21 DIAGNOSIS — M9901 Segmental and somatic dysfunction of cervical region: Secondary | ICD-10-CM | POA: Diagnosis not present

## 2015-10-21 DIAGNOSIS — M5033 Other cervical disc degeneration, cervicothoracic region: Secondary | ICD-10-CM | POA: Diagnosis not present

## 2015-10-22 NOTE — Telephone Encounter (Signed)
Patient called and left me a VM. I returned her call but there was no answer so I left her a VM asking her to return my call.

## 2015-10-28 ENCOUNTER — Other Ambulatory Visit: Payer: Self-pay | Admitting: *Deleted

## 2015-10-28 MED ORDER — ONABOTULINUMTOXINA 100 UNITS IJ SOLR: 300.0000 [IU] | INTRAMUSCULAR | Status: DC

## 2015-10-30 ENCOUNTER — Other Ambulatory Visit: Payer: Self-pay

## 2015-10-30 DIAGNOSIS — Z1231 Encounter for screening mammogram for malignant neoplasm of breast: Secondary | ICD-10-CM

## 2015-11-07 ENCOUNTER — Encounter: Payer: Self-pay | Admitting: Neurology

## 2015-11-07 ENCOUNTER — Ambulatory Visit: Payer: Medicare Other | Admitting: Neurology

## 2015-11-07 ENCOUNTER — Ambulatory Visit (INDEPENDENT_AMBULATORY_CARE_PROVIDER_SITE_OTHER): Payer: Medicare HMO | Admitting: Neurology

## 2015-11-07 VITALS — BP 125/73 | HR 75 | Ht 62.0 in | Wt 143.0 lb

## 2015-11-07 DIAGNOSIS — G8114 Spastic hemiplegia affecting left nondominant side: Secondary | ICD-10-CM | POA: Diagnosis not present

## 2015-11-07 NOTE — Progress Notes (Signed)
**  Botox 100 units x 4 vials, Lot [id number], Exp 04/2018, NDC [id number], specialty pharmacy.**mck, rn.

## 2015-11-07 NOTE — Progress Notes (Signed)
[facility]  CC:  Spastic hemiplegia  [name] is a 65  y.o. Right-handed Caucasian female, return for EMG guided Botox injection for her left spastic hemiparesis,  She suffered stroke in 1999, with residual left spastic hemiparesis, upper extremity more than leg, left visual field cut  She previously was enrolled in Botox research study for spastic upper extremity at [facility] by Dr. Marya Fossa, received EMG guided Botox injection in 2010 for about 2 years, with good benefit, but has difficulty with insurance  She began to receive Botox injection through our clinic by Dr. Hosie Poisson since May 2015,150 units total to left upper extremity, which has been very helpful  She is return for continued EMG guided Botox injection, at baseline, she can ambulate with a left AFO, but without assistance,  Profound left upper extremity spasticity, achiness, chronic pain, persistent left wrist flexion, finger flexion,  She also has a history of coronary artery disease in 2007, taking Plavix, and Aggrenox, exercise regularly, has a personal trainer  UPDATE March 2nd 2016: She received 300 units in Jun 13 2014, to left upper extremity, which has been very helpful, she did not notice significant side effect. The injection help her open her left hand better, no significant side effect noticed.  UPDATE June 8th 2016: She received 300 units of Botox A in March second 2016, responded very well, she was able to relax her left hand better, she ambulate without assistance with spastic left hemiparesis, complains of left facial, left foot pain, wants the injection to emphasize on her left finger flexion wrist flexion today.  UPDATE Sep 28th 2016: She responded to previous Botox injection very well in June, she wants to emphasize on her left upper extremity at this time, complains forceful finger contraction, flexion, elbow flexion. Only mild left shoulder pain with passive  movement.  Update July 31 2015: She responded well to previous injection in September 2016, received 400 units to left upper extremity, she complains of forceful left finger contraction,  UPDATE April 6th 2017: She tolerated previous injection well, now noticed increased left hand thumb in forceful finger flexion, left arm spasticity, mild pronation, shoulder abduction tightness, deep achy pain especially with cold damp whether    Social History   Social History  . Marital Status: Married    Spouse Name: N/A  . Number of Children: N/A  . Years of Education: N/A   Occupational History  . disabled    Social History Main Topics  . Smoking status: Former Games developer  . Smokeless tobacco: Never Used     Comment: Quit in 2005  . Alcohol Use: Yes     Comment: social  . Drug Use: No  . Sexual Activity: Not Currently   Other Topics Concern  . Not on file   Social History Narrative   Married, no children   Right handed   12th grade   1 cup daily    Family History  Problem Relation Age of Onset  . Arrhythmia Father     PPM    Past Medical History  Diagnosis Date  . Unstable angina (HCC)   . Hypothyroidism   . CVA (cerebral vascular accident) (HCC) 1999  . AMI (acute myocardial infarction) (HCC) 2007    failed PCI to the distal LAD - managed medically  . Degenerative disc disease, cervical     Past Surgical History  Procedure Laterality Date  . Endometrial ablation  3/06    Current Outpatient Prescriptions  Medication  Sig Dispense Refill  . ALPRAZolam (XANAX) 0.5 MG tablet Take 0.5 mg by mouth 2 (two) times daily as needed.      . botulinum toxin Type A (BOTOX) 100 units SOLR injection Inject 300 Units into the muscle every 3 (three) months. G81.4. No PA required. Specialty Pharmacy. 3 vial 3  . cetirizine (ZYRTEC) 10 MG tablet Take 10 mg by mouth daily.      . clopidogrel (PLAVIX) 75 MG tablet Take 1 tablet (75 mg total) by mouth daily. 90 tablet 3  .  diphenhydrAMINE (BENADRYL) 25 MG tablet Take 25 mg by mouth every 6 (six) hours as needed.      . dipyridamole-aspirin (AGGRENOX) 200-25 MG per 12 hr capsule Take 1 capsule by mouth 2 (two) times daily.    Marland Kitchen FLUoxetine (PROZAC) 20 MG capsule Take 20 mg by mouth daily.      Marland Kitchen gabapentin (NEURONTIN) 600 MG tablet Take 600 mg by mouth 2 (two) times daily.     Marland Kitchen lactulose, encephalopathy, (CHRONULAC) 10 GM/15ML SOLN Take 10 g by mouth 2 (two) times daily.     Marland Kitchen levothyroxine (SYNTHROID, LEVOTHROID) 88 MCG tablet Take 88 mcg by mouth daily.      Marland Kitchen LINZESS 145 MCG CAPS capsule Take 145 mcg by mouth daily.     . nitroGLYCERIN (NITROSTAT) 0.4 MG SL tablet Place 1 tablet (0.4 mg total) under the tongue every 5 (five) minutes as needed. 25 tablet 3  . pantoprazole (PROTONIX) 40 MG tablet Take 40 mg by mouth daily.      . ranolazine (RANEXA) 500 MG 12 hr tablet Take 1 tablet (500 mg total) by mouth as needed. 60 tablet 11  . rOPINIRole (REQUIP) 0.5 MG tablet Take 0.5 mg by mouth Daily.    . simvastatin (ZOCOR) 40 MG tablet Take 40 mg by mouth at bedtime.      . tapentadol (NUCYNTA) 50 MG TABS tablet Take 50 mg by mouth every 4 (four) hours as needed.    . Tapentadol HCl (NUCYNTA) 75 MG TABS Take 75 mg by mouth daily.     No current facility-administered medications for this visit.    Allergies as of 11/07/2015 - Review Complete 11/07/2015  Allergen Reaction Noted  . Aspirin  08/18/2012  . Morphine  01/01/2009  . Penicillins  01/01/2009  . Zolpidem tartrate  01/01/2009    Vitals: BP 125/73 mmHg  Pulse 75  Ht  (1.575 m)  Wt 143 lb (64.864 kg)  BMI 26.15 kg/m2 Last Weight:  Wt Readings from Last 1 Encounters:  11/07/15 143 lb (64.864 kg)   Last Height:   Ht Readings from Last 1 Encounters:  11/07/15  (1.575 m)   PHYSICAL EXAMINATOINS:  Generalized: In no acute distress  Neck: Supple, no carotid bruits   Cardiac: Regular rate rhythm  Pulmonary: Clear to auscultation  bilaterally  Musculoskeletal: No deformity  Neurological examination  Mentation: Alert oriented to time, place, history taking, and causual conversation, left lower face weakness  Cranial nerve II-XII: Pupils were equal round reactive to light extraocular movements were full, left visual field cut.   Left lower face weakness. hearing was intact to finger rubbing bilaterally. Uvula tongue midline.  head turning and shoulder shrug and were normal and symmetric.Tongue protrusion into cheek strength was normal.  Motor: spastic left hemiparesis,  Wearing left AFO, left lower extremity moderate spasticity, left hip flexion 4, knee flexion 4, extension 5, Profound left upper extremity spasticity, left shoulder adduction, internal  rotation.left elbow flexion, pronation, with passive movement, mild fixed contraction of left elbow, maximum 175, left wrist forceful flexion, finger flexion,thumb in position, even with forceful movement, she could not achieve full extension of her left finger  Gait: Rising up from seated position by pushing on chair arm,spastic left hemi-circumferential gait.  Deep tendon reflexes: hyperreflexia of left side   Assessment and plan:   65 years old right-handed Caucasian female, status post stroke, with spastic left hemiparesis since 1999, responded very well to previous EMG guided Botox injection, return to clinic for repeat injection  Under electric stimulation, 400 units of Botox was injected into left upper extremity muscles  Left pronator teres 25 x2 =50 units Left flexor carpi radialis 50 units Left  Flexor carpi ulnaris 25 x2=50 units Left flexor digitorum profundus 25x2=50 units Left lumbricals, divided into 3 injection sites 25 units Left opponens 25  Left palmaris longus 25 x2=50 units  Left brachialis 50 units Left pectoralis major 25 units Left teres major 25  She tolerated the injection well,Return to clinic in 3 months for repeat injection, she  will need 500 units BOTOX for next injection.  [name], M.D. Ph.D.  [facility] [address] [place], [place] [address] Phone: [phone] Fax:      [phone]

## 2015-11-21 ENCOUNTER — Ambulatory Visit
Admission: RE | Admit: 2015-11-21 | Discharge: 2015-11-21 | Disposition: A | Discharge status: Home / Self Care | Payer: Medicare HMO | Source: Ambulatory Visit

## 2015-11-21 DIAGNOSIS — Z1231 Encounter for screening mammogram for malignant neoplasm of breast: Secondary | ICD-10-CM

## 2016-01-22 DIAGNOSIS — E039 Hypothyroidism, unspecified: Secondary | ICD-10-CM | POA: Diagnosis not present

## 2016-01-22 DIAGNOSIS — E7800 Pure hypercholesterolemia, unspecified: Secondary | ICD-10-CM | POA: Diagnosis not present

## 2016-01-22 DIAGNOSIS — Z0000 Encounter for general adult medical examination without abnormal findings: Secondary | ICD-10-CM | POA: Diagnosis not present

## 2016-01-28 DIAGNOSIS — R69 Illness, unspecified: Secondary | ICD-10-CM | POA: Diagnosis not present

## 2016-01-28 DIAGNOSIS — Z13220 Encounter for screening for lipoid disorders: Secondary | ICD-10-CM | POA: Diagnosis not present

## 2016-01-28 DIAGNOSIS — G2581 Restless legs syndrome: Secondary | ICD-10-CM | POA: Diagnosis not present

## 2016-01-28 DIAGNOSIS — E039 Hypothyroidism, unspecified: Secondary | ICD-10-CM | POA: Diagnosis not present

## 2016-01-28 DIAGNOSIS — I639 Cerebral infarction, unspecified: Secondary | ICD-10-CM | POA: Diagnosis not present

## 2016-01-28 DIAGNOSIS — Z0000 Encounter for general adult medical examination without abnormal findings: Secondary | ICD-10-CM | POA: Diagnosis not present

## 2016-02-11 ENCOUNTER — Telehealth: Payer: Self-pay | Admitting: Neurology

## 2016-02-11 NOTE — Telephone Encounter (Signed)
Patient said she hasn't hear anything about her pharmacy for her Botox. And was just wondering if she could get a update on it.. The best number to contact the patient is [phone]

## 2016-02-13 ENCOUNTER — Ambulatory Visit: Payer: Medicare HMO | Admitting: Neurology

## 2016-02-13 ENCOUNTER — Ambulatory Visit (INDEPENDENT_AMBULATORY_CARE_PROVIDER_SITE_OTHER): Payer: Medicare HMO | Admitting: Neurology

## 2016-02-13 ENCOUNTER — Encounter: Payer: Self-pay | Admitting: Neurology

## 2016-02-13 VITALS — BP 118/78 | HR 70 | Ht 62.0 in | Wt 140.0 lb

## 2016-02-13 DIAGNOSIS — G8114 Spastic hemiplegia affecting left nondominant side: Secondary | ICD-10-CM

## 2016-02-13 NOTE — Progress Notes (Signed)
**  Botox 100 units x 5 vials, Lot [id number], Exp 08/2018, NDC [id number], specialty pharmacy.**[email or web address],rn.

## 2016-02-13 NOTE — Progress Notes (Addendum)
[facility]  CC:  Spastic hemiplegia  Dawn PollenJane G Newton is a 65  y.o. Right-handed Caucasian female, return for EMG guided Botox injection for her left spastic hemiparesis,  She suffered stroke in 1999, with residual left spastic hemiparesis, upper extremity more than leg, left visual field cut  She previously was enrolled in Botox research study for spastic upper extremity at [facility] by Dr. Marya FossaBrashear, received EMG guided Botox injection in 2010 for about 2 years, with good benefit, but has difficulty with insurance  She began to receive Botox injection through our clinic by Dr. Hosie PoissonSumner since May 2015,150 units total to left upper extremity, which has been very helpful  She is return for continued EMG guided Botox injection, at baseline, she can ambulate with a left AFO, but without assistance,  Profound left upper extremity spasticity, achiness, chronic pain, persistent left wrist flexion, finger flexion,  She also has a history of coronary artery disease in 2007, taking Plavix, and Aggrenox, exercise regularly, has a personal trainer  UPDATE March 2nd 2016: She received 300 units in Jun 13 2014, to left upper extremity, which has been very helpful, she did not notice significant side effect. The injection help her open her left hand better, no significant side effect noticed.  UPDATE June 8th 2016: She received 300 units of Botox A in March second 2016, responded very well, she was able to relax her left hand better, she ambulate without assistance with spastic left hemiparesis, complains of left facial, left foot pain, wants the injection to emphasize on her left finger flexion wrist flexion today.  UPDATE Sep 28th 2016: She responded to previous Botox injection very well in June, she wants to emphasize on her left upper extremity at this time, complains forceful finger contraction, flexion, elbow flexion. Only mild left shoulder pain with passive  movement.  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 Patient is concerned about potential side effect of weakness.  Social History   Social History  . Marital Status: Married    Spouse Name: N/A  . Number of Children: N/A  . Years of Education: N/A   Occupational History  . disabled    Social History Main Topics  . Smoking status: Former Games developermoker  . Smokeless tobacco: Never Used     Comment: Quit in 2005  . Alcohol Use: Yes     Comment: social  . Drug Use: No  . Sexual Activity: Not Currently   Other Topics Concern  . Not on file   Social History Narrative   Married, no children   Right handed   12th grade   1 cup daily    Family History  Problem Relation Age of Onset  . Arrhythmia Father     PPM    Past Medical History  Diagnosis Date  . Unstable angina (HCC)   . Hypothyroidism   . CVA (cerebral vascular accident) (HCC) 1999  . AMI (acute  myocardial infarction) (HCC) 2007    failed PCI to the distal LAD - managed medically  . Degenerative disc disease, cervical     Past Surgical History  Procedure Laterality Date  . Endometrial ablation  3/06    Current Outpatient Prescriptions  Medication Sig Dispense Refill  . ALPRAZolam (XANAX) 0.5 MG tablet Take 0.5 mg by mouth 2 (two) times daily as needed.      . botulinum toxin Type A (BOTOX) 100  units SOLR injection Inject 300 Units into the muscle every 3 (three) months. G81.4. No PA required. Specialty Pharmacy. 3 vial 3  . cetirizine (ZYRTEC) 10 MG tablet Take 10 mg by mouth daily.      . clopidogrel (PLAVIX) 75 MG tablet Take 1 tablet (75 mg total) by mouth daily. 90 tablet 3  . diphenhydrAMINE (BENADRYL) 25 MG tablet Take 25 mg by mouth every 6 (six) hours as needed.      . dipyridamole-aspirin (AGGRENOX) 200-25 MG per 12 hr capsule Take 1 capsule by mouth 2 (two) times daily.    Marland Kitchen FLUoxetine (PROZAC) 20 MG capsule Take 20 mg by mouth daily.      Marland Kitchen gabapentin (NEURONTIN) 600 MG tablet Take 600 mg by mouth 2 (two) times daily.     Marland Kitchen lactulose, encephalopathy, (CHRONULAC) 10 GM/15ML SOLN Take 10 g by mouth 2 (two) times daily.     Marland Kitchen levothyroxine (SYNTHROID, LEVOTHROID) 75 MCG tablet Take 75 mcg by mouth daily before breakfast.    . LINZESS 145 MCG CAPS capsule Take 145 mcg by mouth daily.     . nitroGLYCERIN (NITROSTAT) 0.4 MG SL tablet Place 1 tablet (0.4 mg total) under the tongue every 5 (five) minutes as needed. 25 tablet 3  . pantoprazole (PROTONIX) 40 MG tablet Take 40 mg by mouth daily.      . ranolazine (RANEXA) 500 MG 12 hr tablet Take 1 tablet (500 mg total) by mouth as needed. 60 tablet 11  . rOPINIRole (REQUIP) 0.5 MG tablet Take 0.5 mg by mouth Daily.    . simvastatin (ZOCOR) 40 MG tablet Take 40 mg by mouth at bedtime.      . tapentadol (NUCYNTA) 50 MG TABS tablet Take 50 mg by mouth every 4 (four) hours as needed.    . Tapentadol HCl (NUCYNTA) 75 MG TABS Take 75 mg by mouth daily.     No current facility-administered medications for this visit.    Allergies as of 02/13/2016 - Review Complete 02/13/2016  Allergen Reaction Noted  . Aspirin  08/18/2012  . Morphine  01/01/2009  . Penicillins  01/01/2009  . Zolpidem tartrate  01/01/2009    Vitals: BP 118/78 mmHg  Pulse 70  Ht  (1.575 m)  Wt 140 lb (63.504 kg)  BMI 25.60 kg/m2 Last Weight:  Wt Readings  from Last 1 Encounters:  02/13/16 140 lb (63.504 kg)   Last Height:   Ht Readings from Last 1 Encounters:  02/13/16  (1.575 m)   PHYSICAL EXAMINATOINS:  Generalized: In no acute distress  Neck: Supple, no carotid bruits   Cardiac: Regular rate rhythm  Pulmonary: Clear to auscultation bilaterally  Musculoskeletal: No deformity  Neurological examination  Mentation: Alert oriented to time, place, history taking, and causual conversation, left lower face weakness  Cranial nerve II-XII: Pupils were equal round reactive to light extraocular movements were full, left visual field cut.   Left lower face weakness. hearing was intact to finger rubbing bilaterally. Uvula tongue midline.  head  turning and shoulder shrug and were normal and symmetric.Tongue protrusion into cheek strength was normal.  Motor: spastic left hemiparesis,  Wearing left AFO, left lower extremity moderate spasticity, left hip flexion 4, knee flexion 4, extension 5, Profound left upper extremity spasticity, left shoulder adduction, internal rotation.left elbow flexion, pronation, with passive movement, mild fixed contraction of left elbow, maximum 175, left wrist forceful flexion, finger flexion,thumb in position, even with forceful movement, she could not achieve full extension of her left finger  Gait: Rising up from seated position by pushing on chair arm,spastic left hemi-circumferential gait.  Deep tendon reflexes: hyperreflexia of left side   Assessment and plan:   65 years old right-handed Caucasian female, status post stroke, with spastic left hemiparesis since 1999, responded very well to previous EMG guided Botox injection, return to clinic for repeat injection  Under electric stimulation, 500 units of Botox was injected into left upper extremity muscles  Left pronator teres 25 x2 =50 units Left flexor carpi radialis 50 units Left  flexor carpi ulnaris 25 x2=50 units Left flexor digitorum  profundus 25x2=50 units Left lumbricals, divided into 3 injection sites 25 units Left opponens 25  Left palmaris longus 25 x2=50 units  Left brachialis 100 units Left pectoralis major 25x2=50 units Left teres major 25 Left latissimus dorsi 25 units She tolerated the injection well,Return to clinic in 3 months for repeat injection, she will need 500 units BOTOX for next injection.  She would benefit the left wrist hand orthosis for better control of her left arm/hand spasticity  [name], M.D. Ph.D.  [facility] [address] [place], [place] [address] Phone: [phone] Fax:      [phone]

## 2016-03-04 DIAGNOSIS — G8929 Other chronic pain: Secondary | ICD-10-CM | POA: Diagnosis not present

## 2016-03-04 DIAGNOSIS — Z79899 Other long term (current) drug therapy: Secondary | ICD-10-CM | POA: Diagnosis not present

## 2016-03-04 DIAGNOSIS — M5030 Other cervical disc degeneration, unspecified cervical region: Secondary | ICD-10-CM | POA: Diagnosis not present

## 2016-03-04 DIAGNOSIS — G629 Polyneuropathy, unspecified: Secondary | ICD-10-CM | POA: Diagnosis not present

## 2016-03-27 DIAGNOSIS — J0100 Acute maxillary sinusitis, unspecified: Secondary | ICD-10-CM | POA: Insufficient documentation

## 2016-03-28 DIAGNOSIS — Z6824 Body mass index (BMI) 24.0-24.9, adult: Secondary | ICD-10-CM | POA: Diagnosis not present

## 2016-03-28 DIAGNOSIS — J0101 Acute recurrent maxillary sinusitis: Secondary | ICD-10-CM | POA: Diagnosis not present

## 2016-04-07 DIAGNOSIS — M5030 Other cervical disc degeneration, unspecified cervical region: Secondary | ICD-10-CM | POA: Diagnosis not present

## 2016-04-07 DIAGNOSIS — G8929 Other chronic pain: Secondary | ICD-10-CM | POA: Diagnosis not present

## 2016-04-07 DIAGNOSIS — G629 Polyneuropathy, unspecified: Secondary | ICD-10-CM | POA: Diagnosis not present

## 2016-04-07 DIAGNOSIS — Z79891 Long term (current) use of opiate analgesic: Secondary | ICD-10-CM | POA: Diagnosis not present

## 2016-04-29 DIAGNOSIS — L89892 Pressure ulcer of other site, stage 2: Secondary | ICD-10-CM | POA: Diagnosis not present

## 2016-04-29 DIAGNOSIS — M79672 Pain in left foot: Secondary | ICD-10-CM | POA: Diagnosis not present

## 2016-05-21 ENCOUNTER — Ambulatory Visit: Payer: Self-pay | Admitting: Neurology

## 2016-05-21 ENCOUNTER — Ambulatory Visit (INDEPENDENT_AMBULATORY_CARE_PROVIDER_SITE_OTHER): Payer: Medicare HMO | Admitting: Neurology

## 2016-05-21 ENCOUNTER — Encounter: Payer: Self-pay | Admitting: Neurology

## 2016-05-21 ENCOUNTER — Ambulatory Visit: Payer: Medicare HMO | Admitting: Neurology

## 2016-05-21 VITALS — BP 125/76 | HR 73 | Ht 62.0 in | Wt 139.0 lb

## 2016-05-21 DIAGNOSIS — G8114 Spastic hemiplegia affecting left nondominant side: Secondary | ICD-10-CM | POA: Diagnosis not present

## 2016-05-21 DIAGNOSIS — IMO0002 Reserved for concepts with insufficient information to code with codable children: Secondary | ICD-10-CM

## 2016-05-21 NOTE — Progress Notes (Signed)
**  Botox 100 units x 5 vials, Lot [id number], Exp 11/2018, N[facility] [id number], specialty pharmacy//[email or web address].**

## 2016-05-21 NOTE — Patient Instructions (Signed)
SoftPro Functional Resting Hand Splint      Grip Splint by Cecille PoAliMed

## 2016-05-21 NOTE — Progress Notes (Signed)
[facility]  CC:  Spastic hemiplegia  [name] is a 65  y.o. Right-handed Caucasian female, return for EMG guided Botox injection for her left spastic hemiparesis,  She suffered stroke in 1999, with residual left spastic hemiparesis, upper extremity more than leg, left visual field cut  She previously was enrolled in Botox research study for spastic upper extremity at [facility] by Dr. Marya Fossa, received EMG guided Botox injection in 2010 for about 2 years, with good benefit, but has difficulty with insurance  She began to receive Botox injection through our clinic by Dr. Hosie Poisson since May 2015,150 units total to left upper extremity, which has been very helpful  She is return for continued EMG guided Botox injection, at baseline, she can ambulate with a left AFO, but without assistance,  Profound left upper extremity spasticity, achiness, chronic pain, persistent left wrist flexion, finger flexion,  She also has a history of coronary artery disease in 2007, taking Plavix, and Aggrenox, exercise regularly, has a personal trainer  UPDATE March 2nd 2016: She received 300 units in Jun 13 2014, to left upper extremity, which has been very helpful, she did not notice significant side effect. The injection help her open her left hand better, no significant side effect noticed.  UPDATE June 8th 2016: She received 300 units of Botox A in March second 2016, responded very well, she was able to relax her left hand better, she ambulate without assistance with spastic left hemiparesis, complains of left facial, left foot pain, wants the injection to emphasize on her left finger flexion wrist flexion today.  UPDATE Sep 28th 2016: She responded to previous Botox injection very well in June, she wants to emphasize on her left upper extremity at this time, complains forceful finger contraction, flexion, elbow flexion. Only mild left shoulder pain with passive  movement.  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 Patient is concerned about potential side effect of weakness.  Update May 21 2016: She responded very well to previous injection in July, only wants to receive injection for spastic left upper extremity, she had a pair of left wrist/finger splint,  Social History   Social History  . Marital status: Married    Spouse name: N/A  . Number of children: N/A  . Years of education: N/A   Occupational History  . disabled    Social History Main Topics  . Smoking status: Former Games developer  . Smokeless tobacco: Never Used     Comment: Quit in 2005  . Alcohol use Yes     Comment: social  . Drug use: No  . Sexual activity: Not Currently   Other Topics Concern  . Not on file   Social History Narrative   Married, no children   Right handed   12th grade   1 cup daily    Family History  Problem Relation Age of Onset  . Arrhythmia Father  PPM    Past Medical History:  Diagnosis Date  . AMI (acute myocardial infarction) 2007   failed PCI to the distal LAD - managed medically  . CVA (cerebral vascular accident) (HCC) 1999  . Degenerative disc disease, cervical   . Hypothyroidism   . Unstable angina [facility](HCC)     Past Surgical History:  Procedure Laterality Date  . ENDOMETRIAL ABLATION  3/06    Current  Outpatient Prescriptions  Medication Sig Dispense Refill  . ALPRAZolam (XANAX) 0.5 MG tablet Take 0.5 mg by mouth 2 (two) times daily as needed.      Marland Kitchen. aspirin 81 MG tablet Take 81 mg by mouth daily.    . cetirizine (ZYRTEC) 10 MG tablet Take 10 mg by mouth daily.      . clopidogrel (PLAVIX) 75 MG tablet Take 1 tablet (75 mg total) by mouth daily. 90 tablet 3  . diphenhydrAMINE (BENADRYL) 25 MG tablet Take 25 mg by mouth every 6 (six) hours as needed.      Marland Kitchen. FLUoxetine (PROZAC) 20 MG capsule Take 20 mg by mouth daily.      Marland Kitchen. gabapentin (NEURONTIN) 600 MG tablet Take 600 mg by mouth 2 (two) times daily.     Marland Kitchen. lactulose, encephalopathy, (CHRONULAC) 10 GM/15ML SOLN Take 10 g by mouth 2 (two) times daily.     Marland Kitchen. levothyroxine (SYNTHROID, LEVOTHROID) 75 MCG tablet Take 75 mcg by mouth daily before breakfast.    . LINZESS 145 MCG CAPS capsule Take 145 mcg by mouth daily.     . nitroGLYCERIN (NITROSTAT) 0.4 MG SL tablet Place 1 tablet (0.4 mg total) under the tongue every 5 (five) minutes as needed. 25 tablet 3  . NUCYNTA 75 MG tablet TAKE 1 TABLET BY MOUTH EVERY FOUR TO SIX HOURS AS NEEDED  0  . NUCYNTA ER 50 MG 12 hr tablet TAKE 1 TABLET TWO TIMES DAILY FOR 30 DAYS  0  . OnabotulinumtoxinA (BOTOX IJ) Inject 500 Units as directed every 3 (three) months.    . pantoprazole (PROTONIX) 40 MG tablet Take 40 mg by mouth daily.      . ranolazine (RANEXA) 500 MG 12 hr tablet Take 1 tablet (500 mg total) by mouth as needed. 60 tablet 11  . rOPINIRole (REQUIP) 0.5 MG tablet Take 0.5 mg by mouth Daily.    . simvastatin (ZOCOR) 40 MG tablet Take 40 mg by mouth at bedtime.       No current facility-administered medications for this visit.     Allergies as of 05/21/2016 - Review Complete 05/21/2016  Allergen Reaction Noted  . Aspirin  08/18/2012  . Morphine  01/01/2009  . Penicillins  01/01/2009  . Zolpidem tartrate  01/01/2009    Vitals: BP 125/76   Pulse 73   Ht 5\' 2"  (1.575 m)   Wt 139 lb (63 kg)    BMI 25.42 kg/m  Last Weight:  Wt Readings from Last 1 Encounters:  05/21/16 139 lb (63 kg)   Last Height:   Ht Readings from Last 1 Encounters:  05/21/16 5\' 2"  (1.575 m)   PHYSICAL EXAMINATOINS:  Generalized: In no acute distress  Neck: Supple, no carotid bruits   Cardiac: Regular rate rhythm  Pulmonary: Clear to auscultation bilaterally  Musculoskeletal: No deformity  Neurological examination  Mentation: Alert oriented to time, place, history taking, and causual conversation, left lower face weakness  Cranial nerve II-XII: Pupils were equal round reactive to light extraocular movements were full, left visual field cut.  Left lower face weakness. hearing was intact to finger rubbing bilaterally. Uvula tongue midline.  head turning and shoulder shrug and were normal and symmetric.Tongue protrusion into cheek strength was normal.  Motor: spastic left hemiparesis,  Wearing left AFO, left lower extremity moderate spasticity, left hip flexion 4, knee flexion 4, extension 5, Profound left upper extremity spasticity, left shoulder adduction, internal rotation.left elbow flexion, pronation, with passive movement, mild fixed contraction of left elbow, maximum 175, left wrist forceful flexion, finger flexion,thumb in position, even with forceful movement, she could not achieve full extension of her left finger  Gait: Rising up from seated position by pushing on chair arm,spastic left hemi-circumferential gait.  Deep tendon reflexes: hyperreflexia of left side   Assessment and plan:   65 years old right-handed Caucasian female, status post stroke, with spastic left hemiparesis since 1999, responded very well to previous EMG guided Botox injection, return to clinic for repeat injection  Under electric stimulation, 500 units of Botox was injected into left upper extremity muscles  Left pronator teres 25 x2 =50 units Left flexor carpi radialis 50 units Left  flexor carpi ulnaris 25  x3=75units Left flexor digitorum profundus 25x2=50 units Left lumbricals, divided into 3 injection sites 25 units Left opponens 25x2=50   Left palmaris longus 25 x2=50 units  Left brachialis 100 units Left pectoralis major 25x2=50 units  She tolerated the injection well,Return to clinic in 3 months for repeat injection, she will need 500 units BOTOX for next injection.  She would benefit the left wrist hand orthosis for better control of her left arm/hand spasticity  [name], M.D. Ph.D.  [facility] [address] [place], [place] [address] Phone: [phone] Fax:      [phone]

## 2016-05-26 DIAGNOSIS — Z23 Encounter for immunization: Secondary | ICD-10-CM | POA: Diagnosis not present

## 2016-06-04 DIAGNOSIS — Z79891 Long term (current) use of opiate analgesic: Secondary | ICD-10-CM | POA: Diagnosis not present

## 2016-06-04 DIAGNOSIS — G629 Polyneuropathy, unspecified: Secondary | ICD-10-CM | POA: Diagnosis not present

## 2016-06-04 DIAGNOSIS — G8929 Other chronic pain: Secondary | ICD-10-CM | POA: Diagnosis not present

## 2016-06-04 DIAGNOSIS — M5030 Other cervical disc degeneration, unspecified cervical region: Secondary | ICD-10-CM | POA: Diagnosis not present

## 2016-07-03 ENCOUNTER — Telehealth: Payer: Self-pay | Admitting: Neurology

## 2016-07-03 NOTE — Telephone Encounter (Signed)
Pt botox appt 08/20/15 needs r/s due to Dr Yan out on jury duty. Please r/s botox appt. ° °

## 2016-07-21 DIAGNOSIS — Z6824 Body mass index (BMI) 24.0-24.9, adult: Secondary | ICD-10-CM | POA: Diagnosis not present

## 2016-07-21 DIAGNOSIS — G2581 Restless legs syndrome: Secondary | ICD-10-CM | POA: Diagnosis not present

## 2016-07-21 DIAGNOSIS — I639 Cerebral infarction, unspecified: Secondary | ICD-10-CM | POA: Diagnosis not present

## 2016-07-22 DIAGNOSIS — M79672 Pain in left foot: Secondary | ICD-10-CM | POA: Diagnosis not present

## 2016-07-22 DIAGNOSIS — L89892 Pressure ulcer of other site, stage 2: Secondary | ICD-10-CM | POA: Diagnosis not present

## 2016-07-22 DIAGNOSIS — S20219A Contusion of unspecified front wall of thorax, initial encounter: Secondary | ICD-10-CM | POA: Insufficient documentation

## 2016-07-22 DIAGNOSIS — W19XXXA Unspecified fall, initial encounter: Secondary | ICD-10-CM | POA: Insufficient documentation

## 2016-07-22 DIAGNOSIS — Z6825 Body mass index (BMI) 25.0-25.9, adult: Secondary | ICD-10-CM | POA: Insufficient documentation

## 2016-07-23 DIAGNOSIS — W19XXXA Unspecified fall, initial encounter: Secondary | ICD-10-CM | POA: Diagnosis not present

## 2016-07-23 DIAGNOSIS — S20212A Contusion of left front wall of thorax, initial encounter: Secondary | ICD-10-CM | POA: Diagnosis not present

## 2016-07-23 DIAGNOSIS — Z6825 Body mass index (BMI) 25.0-25.9, adult: Secondary | ICD-10-CM | POA: Diagnosis not present

## 2016-08-04 DIAGNOSIS — G8929 Other chronic pain: Secondary | ICD-10-CM | POA: Diagnosis not present

## 2016-08-04 DIAGNOSIS — M5030 Other cervical disc degeneration, unspecified cervical region: Secondary | ICD-10-CM | POA: Diagnosis not present

## 2016-08-04 DIAGNOSIS — G629 Polyneuropathy, unspecified: Secondary | ICD-10-CM | POA: Diagnosis not present

## 2016-08-04 DIAGNOSIS — Z79891 Long term (current) use of opiate analgesic: Secondary | ICD-10-CM | POA: Diagnosis not present

## 2016-08-11 NOTE — Telephone Encounter (Signed)
Patients apt has been moved to 02/07.

## 2016-08-19 ENCOUNTER — Ambulatory Visit: Payer: Medicare HMO | Admitting: Neurology

## 2016-08-24 NOTE — Progress Notes (Signed)
CARDIOLOGY OFFICE NOTE  Date:  08/25/2016    Dawn PollenJane G Wallner Date of Birth: 11/08/1950 Medical Record #[id number]  PCP:  Selinda FlavinHOWARD, KEVIN, MD  Cardiologist:  Tyrone SageGerhardt   Chief Complaint  Patient presents with  . Coronary Artery Disease    1 year check - former patient of Dr. Vern ClaudeWall's    History of Present Illness: [name] is a 66 y.o. female who presents today for a follow up visit.  She is a former patient of Dr. Vern ClaudeWall's - now following by me.   She has a history of CAD with past MI in 2007 and had failed PCI to the distal LAD - managed medically - has used "PRN" Ranexa since that time. Other issues include prior CVA from 1999, hypothyroidism, HLD and GERD.  Saw me last year and she had been having some chest pain. Her exercise tolerance was stable. We did get her Myoview updated and this looked ok. She continues to have significant nerve pain from a remote stroke but she continues to "press on" in quite an amazing fashion.   Comes in today. Here alone. She has had a good year. No real prolonged spells of chest pain. Still with fairly good exercise tolerance. She is more limited by her nerve pain from her stroke. Left sided weakness persists. She gets her labs from PCP. She needs NTG refilled. She is happy with how she is doing overall.   Past Medical History:  Diagnosis Date  . AMI (acute myocardial infarction) 2007   failed PCI to the distal LAD - managed medically  . CVA (cerebral vascular accident) (HCC) 1999  . Degenerative disc disease, cervical   . Hypothyroidism   . Unstable angina [facility](HCC)     Past Surgical History:  Procedure Laterality Date  . ENDOMETRIAL ABLATION  3/06     Medications: Current Outpatient Prescriptions  Medication Sig Dispense Refill  . ALPRAZolam (XANAX) 0.5 MG tablet Take 0.5 mg by mouth 2 (two) times daily as needed.      Marland Kitchen. aspirin 81 MG tablet Take 81 mg by mouth daily.    . cetirizine (ZYRTEC) 10 MG tablet Take 10 mg by mouth daily.       . clopidogrel (PLAVIX) 75 MG tablet Take 1 tablet (75 mg total) by mouth daily. 90 tablet 3  . DENTA 5000 PLUS 1.1 % CREA dental cream Place 1 application onto teeth at bedtime.   4  . diphenhydrAMINE (BENADRYL) 25 MG tablet Take 25 mg by mouth every 6 (six) hours as needed.      Marland Kitchen. FLUoxetine (PROZAC) 20 MG capsule Take 20 mg by mouth daily.      Marland Kitchen. gabapentin (NEURONTIN) 600 MG tablet Take 600 mg by mouth 2 (two) times daily.     Marland Kitchen. lactulose, encephalopathy, (CHRONULAC) 10 GM/15ML SOLN Take 10 g by mouth 2 (two) times daily.     Marland Kitchen. levothyroxine (SYNTHROID, LEVOTHROID) 75 MCG tablet Take 75 mcg by mouth daily before breakfast.    . LINZESS 145 MCG CAPS capsule Take 145 mcg by mouth daily.     . nitroGLYCERIN (NITROSTAT) 0.4 MG SL tablet Place 1 tablet (0.4 mg total) under the tongue every 5 (five) minutes as needed. 25 tablet 3  . NUCYNTA 75 MG tablet TAKE 1 TABLET BY MOUTH EVERY FOUR TO SIX HOURS AS NEEDED  0  . NUCYNTA ER 50 MG 12 hr tablet TAKE 1 TABLET TWO TIMES DAILY FOR 30 DAYS  0  .  OnabotulinumtoxinA (BOTOX IJ) Inject 500 Units as directed every 3 (three) months.    . pantoprazole (PROTONIX) 40 MG tablet Take 40 mg by mouth daily.      . polyethylene glycol powder (GLYCOLAX/MIRALAX) powder MIX 17GM IN 8 OZ. OF WATER DAILY OR TWICE DAILY FOR 30 DAYS  6  . ranolazine (RANEXA) 500 MG 12 hr tablet Take 1 tablet (500 mg total) by mouth as needed. 60 tablet 11  . rOPINIRole (REQUIP) 0.5 MG tablet Take 0.5 mg by mouth Daily.    . simvastatin (ZOCOR) 40 MG tablet Take 40 mg by mouth at bedtime.       No current facility-administered medications for this visit.     Allergies: Allergies  Allergen Reactions  . Aspirin     Gastric intolerance  . Morphine   . Penicillins   . Zolpidem Tartrate     Social History: The patient  reports that she has quit smoking. She has never used smokeless tobacco. She reports that she drinks alcohol. She reports that she does not use drugs.   Family  History: The patient's family history includes Arrhythmia in her father.   Review of Systems: Please see the history of present illness.   Otherwise, the review of systems is positive for none.   All other systems are reviewed and negative.   Physical Exam: VS:  BP 132/80   Pulse 74   Ht 5' 2.5" (1.588 m)   Wt 138 lb 6.4 oz (62.8 kg)   BMI 24.91 kg/m  .  BMI Body mass index is 24.91 kg/m.  Wt Readings from Last 3 Encounters:  08/25/16 138 lb 6.4 oz (62.8 kg)  05/21/16 139 lb (63 kg)  02/13/16 140 lb (63.5 kg)    General: Pleasant. She is alert and in no acute distress.  She walks with a limp. She has left sided weakness.  HEENT: Normal.  Neck: Supple, no JVD, carotid bruits, or masses noted.  Cardiac: Regular rate and rhythm. No murmurs, rubs, or gallops. No edema.  Respiratory:  Lungs are clear to auscultation bilaterally with normal work of breathing.  GI: Soft and nontender.  MS: No deformity or atrophy. Gait and ROM intact.  Skin: Warm and dry. Color is normal.  Neuro:  Strength and sensation are intact and no gross focal deficits noted.  Psych: Alert, appropriate and with normal affect.   LABORATORY DATA:  EKG:  EKG is ordered today. This demonstrates NSR.  Lab Results  Component Value Date   GLUCOSE 90 09/10/2009   CHOL 164 09/10/2009   TRIG 111.0 09/10/2009   HDL 55.70 09/10/2009   LDLCALC 86 09/10/2009   ALT 23 09/10/2009   AST 24 09/10/2009   NA 140 09/10/2009   K 4.0 09/10/2009   CL 108 09/10/2009   CREATININE 0.7 09/10/2009   BUN 13 09/10/2009   CO2 30 09/10/2009    BNP (last 3 results) No results for input(s): BNP in the last 8760 hours.  ProBNP (last 3 results) No results for input(s): PROBNP in the last 8760 hours.   Other Studies Reviewed Today:  Myoview Study Result 08/2015   No diagnostic ST segment changes to indicate ischemia.  Small, mild intensity, lateral apical region of ischemia in the setting of artifact from extracardiac  radiotracer uptake and also arms down imaging. No large ischemic territories.  This is a low risk study.  Nuclear stress EF: 74%.      Echo Study Conclusions from 2011 Left ventricle: The  cavity size was normal. Systolic function was normal. The estimated ejection fraction was in the range of 55% to 60%. Wall motion was normal; there were no regional wall motion abnormalities.   CARDIAC CATHETERIZATION 2007  HEMODYNAMICS:  1. Left main coronary artery: The left main coronary artery is free of  significant disease.  2. Left anterior descending artery: The left anterior descending artery  gave rise to two diagonal branches and a septal perforator. The LAD was  quite tortuous in its mid portion and then had a 99% occlusion in its  distal portion with TIMI-I flow.  3. Circumflex artery: The circumflex artery gave rise to an atrial branch,  marginal branch, and two posterolateral branches. These vessels were  free of significant disease.  4. Right coronary artery: The right coronary artery is a moderate size  vessel which gave rise to a clonus branch, right ventricular branch,  posterior descending branch, and a posterolateral branch. This vessel  was free of significant disease.  LEFT VENTRICULOGRAM: The left ventriculogram was obtained in the RAO  projection showed akinesis of the tip of the apex in the inferoapical  segment. The overall wall motion was good with an estimated ejection  fraction of 60%.  Following attempt at PCA there was persistent 99% stenosis in the distal LAD  with TIMI-I flow.  CONCLUSION:  1. Coronary artery disease, status post recent non-ST-elevation myocardial  infarction with 99% stenosis in the distal left anterior descending with  TIMI-I flow, no significant obstruction in the circumflex and right  coronary arteries, and inferoapical wall akinesis.  2. Unsuccessful percutaneous coronary intervention of the lesion in the   distal left anterior descending due to inability to cross with the wire.  DISPOSITION: The patient returned to the postanesthesia care unit for  further observation. I think her outlook should be good on medical therapy.  She has no significant residual coronary disease and overall LV function is  good.  ______________________________  Charlies Constable, M.D. [facility]  BB/MEDQ D: 08/25/2005 T: 08/25/2005 Job: [id number]   Assessment / Plan: 1. CAD - remote NSTEMI - with unsuccessful PCI to the distal LAD and no significant residual CAD - managed medically. Low risk Myoview last year. She continues to do remarkably well. NTG refilled. Continue with her current regimen.   2. HLD - on statin - monitored by PCP  3. Prior stroke - on chronic Plavix.  4. 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   Current medicines are reviewed with the patient today.  The patient does not have concerns regarding medicines other than what has been noted above.  The following changes have been made:  See above.  Labs/ tests ordered today include:    Orders Placed This Encounter  Procedures  . EKG 12-Lead     Disposition:   FU with me in 1 year.   Patient is agreeable to this plan and will call if any problems develop in the interim.   SignedNorma Fredrickson, NP  08/25/2016 11:04 AM  [facility] [address] [place], [place]  [address] Phone: [phone] Fax: [phone]

## 2016-08-25 ENCOUNTER — Encounter: Payer: Self-pay | Admitting: Nurse Practitioner

## 2016-08-25 ENCOUNTER — Ambulatory Visit (INDEPENDENT_AMBULATORY_CARE_PROVIDER_SITE_OTHER): Payer: Medicare HMO | Admitting: Nurse Practitioner

## 2016-08-25 VITALS — BP 132/80 | HR 74 | Ht 62.5 in | Wt 138.4 lb

## 2016-08-25 DIAGNOSIS — I259 Chronic ischemic heart disease, unspecified: Secondary | ICD-10-CM | POA: Diagnosis not present

## 2016-08-25 DIAGNOSIS — E7800 Pure hypercholesterolemia, unspecified: Secondary | ICD-10-CM | POA: Diagnosis not present

## 2016-08-25 MED ORDER — NITROGLYCERIN 0.4 MG SL SUBL: 0.4000 mg | SUBLINGUAL_TABLET | SUBLINGUAL | 3 refills | Status: DC | PRN

## 2016-08-25 NOTE — Patient Instructions (Addendum)
We will be checking the following labs today - NONE   Medication Instructions:    Continue with your current medicines.   I sent in your refill for your NTG today.     Testing/Procedures To Be Arranged:  N/A  Follow-Up:   See me in one year     Other Special Instructions:   Keep up the good work!!    If you need a refill on your cardiac medications before your next appointment, please call your pharmacy.   Call the [facility] office at [phone] if you have any questions, problems or concerns.

## 2016-09-09 ENCOUNTER — Telehealth: Payer: Self-pay | Admitting: Neurology

## 2016-09-09 ENCOUNTER — Ambulatory Visit (INDEPENDENT_AMBULATORY_CARE_PROVIDER_SITE_OTHER): Payer: Medicare HMO | Admitting: Neurology

## 2016-09-09 ENCOUNTER — Encounter: Payer: Self-pay | Admitting: Neurology

## 2016-09-09 VITALS — BP 118/76 | HR 72 | Ht 62.5 in | Wt 136.0 lb

## 2016-09-09 DIAGNOSIS — G8114 Spastic hemiplegia affecting left nondominant side: Secondary | ICD-10-CM | POA: Diagnosis not present

## 2016-09-09 DIAGNOSIS — IMO0002 Reserved for concepts with insufficient information to code with codable children: Secondary | ICD-10-CM

## 2016-09-09 DIAGNOSIS — L89892 Pressure ulcer of other site, stage 2: Secondary | ICD-10-CM | POA: Diagnosis not present

## 2016-09-09 DIAGNOSIS — M79672 Pain in left foot: Secondary | ICD-10-CM | POA: Diagnosis not present

## 2016-09-09 DIAGNOSIS — I6350 Cerebral infarction due to unspecified occlusion or stenosis of unspecified cerebral artery: Secondary | ICD-10-CM

## 2016-09-09 NOTE — Progress Notes (Signed)
**  Botox 100 units x 5 vials, NDC [id number], Lot [id number], Exp 03/2019, specialty pharmacy.//mck,rn**

## 2016-09-09 NOTE — Progress Notes (Signed)
[facility]  CC:  Spastic hemiplegia  [name] is a 66  y.o. Right-handed Caucasian female, return for EMG guided Botox injection for her left spastic hemiparesis,  She suffered stroke in 1999, with residual left spastic hemiparesis, upper extremity more than leg, left visual field cut  She previously was enrolled in Botox research study for spastic upper extremity at [facility] by Dr. Marya Fossa, received EMG guided Botox injection in 2010 for about 2 years, with good benefit, but has difficulty with insurance  She began to receive Botox injection through our clinic by Dr. Hosie Poisson since May 2015,150 units total to left upper extremity, which has been very helpful  She is return for continued EMG guided Botox injection, at baseline, she can ambulate with a left AFO, but without assistance,  Profound left upper extremity spasticity, achiness, chronic pain, persistent left wrist flexion, finger flexion,  She also has a history of coronary artery disease in 2007, taking Plavix, and Aggrenox, exercise regularly, has a personal trainer  UPDATE March 2nd 2016: She received 300 units in Jun 13 2014, to left upper extremity, which has been very helpful, she did not notice significant side effect. The injection help her open her left hand better, no significant side effect noticed.  UPDATE June 8th 2016: She received 300 units of Botox A in March second 2016, responded very well, she was able to relax her left hand better, she ambulate without assistance with spastic left hemiparesis, complains of left facial, left foot pain, wants the injection to emphasize on her left finger flexion wrist flexion today.  UPDATE Sep 28th 2016: She responded to previous Botox injection very well in June, she wants to emphasize on her left upper extremity at this time, complains forceful finger contraction, flexion, elbow flexion. Only mild left shoulder pain with passive  movement.  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 Patient is concerned about potential side effect of weakness.  Update May 21 2016: She responded very well to previous injection in July, only wants to receive injection for spastic left upper extremity, she had a pair of left wrist/finger splint,  Update September 09 2016: She is now using a left wrist splint, which has been helpful,  Social History   Social History  . Marital status: Married    Spouse name: N/A  . Number of children: N/A  . Years of education: N/A   Occupational History  . disabled    Social History Main Topics  . Smoking status: Former Games developer  . Smokeless tobacco: Never Used     Comment: Quit in 2005  . Alcohol use Yes     Comment: social  . Drug use: No  . Sexual activity: Not Currently   Other Topics Concern  . Not on file   Social History Narrative   Married, no children   Right handed   12th grade   1 cup daily  Family History  Problem Relation Age of Onset  . Arrhythmia Father     PPM    Past Medical History:  Diagnosis Date  . AMI (acute myocardial infarction) 2007   failed PCI to the distal LAD - managed medically  . CVA (cerebral vascular accident) (HCC) 1999  . Degenerative disc disease, cervical   . Hypothyroidism   . Unstable angina [facility](HCC)     Past  Surgical History:  Procedure Laterality Date  . ENDOMETRIAL ABLATION  3/06    Current Outpatient Prescriptions  Medication Sig Dispense Refill  . ALPRAZolam (XANAX) 0.5 MG tablet Take 0.5 mg by mouth 2 (two) times daily as needed.      Marland Kitchen. aspirin 81 MG tablet Take 81 mg by mouth daily.    . cetirizine (ZYRTEC) 10 MG tablet Take 10 mg by mouth daily.      . clopidogrel (PLAVIX) 75 MG tablet Take 1 tablet (75 mg total) by mouth daily. 90 tablet 3  . DENTA 5000 PLUS 1.1 % CREA dental cream Place 1 application onto teeth at bedtime.   4  . diphenhydrAMINE (BENADRYL) 25 MG tablet Take 25 mg by mouth every 6 (six) hours as needed.      Marland Kitchen. FLUoxetine (PROZAC) 20 MG capsule Take 20 mg by mouth daily.      Marland Kitchen. gabapentin (NEURONTIN) 600 MG tablet Take 600 mg by mouth 2 (two) times daily.     Marland Kitchen. lactulose, encephalopathy, (CHRONULAC) 10 GM/15ML SOLN Take 10 g by mouth 2 (two) times daily.     Marland Kitchen. levothyroxine (SYNTHROID, LEVOTHROID) 75 MCG tablet Take 75 mcg by mouth daily before breakfast.    . LINZESS 145 MCG CAPS capsule Take 145 mcg by mouth daily.     . nitroGLYCERIN (NITROSTAT) 0.4 MG SL tablet Place 1 tablet (0.4 mg total) under the tongue every 5 (five) minutes as needed. 25 tablet 3  . NUCYNTA 75 MG tablet TAKE 1 TABLET BY MOUTH EVERY FOUR TO SIX HOURS AS NEEDED  0  . NUCYNTA ER 50 MG 12 hr tablet TAKE 1 TABLET TWO TIMES DAILY FOR 30 DAYS  0  . OnabotulinumtoxinA (BOTOX IJ) Inject 500 Units as directed every 3 (three) months.    . pantoprazole (PROTONIX) 40 MG tablet Take 40 mg by mouth daily.      . polyethylene glycol powder (GLYCOLAX/MIRALAX) powder MIX 17GM IN 8 OZ. OF WATER DAILY OR TWICE DAILY FOR 30 DAYS  6  . ranolazine (RANEXA) 500 MG 12 hr tablet Take 1 tablet (500 mg total) by mouth as needed. 60 tablet 11  . rOPINIRole (REQUIP) 0.5 MG tablet Take 0.5 mg by mouth Daily.    . simvastatin (ZOCOR) 40 MG tablet Take 40 mg by mouth at bedtime.       No current facility-administered  medications for this visit.     Allergies as of 09/09/2016 - Review Complete 09/09/2016  Allergen Reaction Noted  . Aspirin  08/18/2012  . Morphine  01/01/2009  . Penicillins  01/01/2009  . Zolpidem tartrate  01/01/2009    Vitals: BP 118/76   Pulse 72   Ht 5' 2.5" (1.588 m)   Wt 136 lb (61.7 kg)   BMI 24.48 kg/m  Last Weight:  Wt Readings from Last 1 Encounters:  09/09/16 136 lb (61.7 kg)   Last Height:   Ht Readings from Last 1 Encounters:  09/09/16 5' 2.5" (1.588 m)   PHYSICAL EXAMINATOINS:  Generalized: In no acute distress  Neck:  Supple, no carotid bruits   Cardiac: Regular rate rhythm  Pulmonary: Clear to auscultation bilaterally  Musculoskeletal: No deformity  Neurological examination  Mentation: Alert oriented to time, place, history taking, and causual conversation, left lower face weakness  Cranial nerve II-XII: Pupils were equal round reactive to light extraocular movements were full, left visual field cut.   Left lower face weakness. hearing was intact to finger rubbing bilaterally. Uvula tongue midline.  head turning and shoulder shrug and were normal and symmetric.Tongue protrusion into cheek strength was normal.  Motor: spastic left hemiparesis,  Wearing left AFO, left lower extremity moderate spasticity, left hip flexion 4, knee flexion 4, extension 5, Profound left upper extremity spasticity, left shoulder adduction, internal rotation.left elbow flexion, pronation, with passive movement, mild fixed contraction of left elbow, maximum 175, left wrist forceful flexion, finger flexion,thumb in position, even with forceful movement, she could not achieve full extension of her left finger  Gait: Rising up from seated position by pushing on chair arm,spastic left hemi-circumferential gait.  Deep tendon reflexes: hyperreflexia of left side   Assessment and plan:   66 years old right-handed Caucasian female, status post stroke, with spastic left  hemiparesis since 1999, responded very well to previous EMG guided Botox injection, return to clinic for repeat injection  Under electric stimulation, 500 units of Botox was injected into left upper extremity muscles  Left pronator teres 25 x2 =50 units Left flexor carpi radialis 50 units Left  flexor carpi ulnaris 25 x2=50units Left flexor digitorum profundus 25x2=50 units Left lumbricals, divided into 3 injection sites 50  units Left opponens 25x2=50   Left palmaris longus 25 x2=50 units  Left brachialis 50 units Left pectoralis major 25x2=50 units Left teres major 50 units  She tolerated the injection well,Return to clinic in 3 months for repeat injection, she will need 500 units BOTOX for next injection.  She would benefit the left wrist hand orthosis for better control of her left arm/hand spasticity  [name], M.D. Ph.D.  [facility] [address] [place], [place] [address] Phone: [phone] Fax:      [phone]

## 2016-09-09 NOTE — Telephone Encounter (Signed)
Seth BakeDana Jones asked be to send a print out of 2017 for taxes. I placed this in the mail.

## 2016-09-09 NOTE — Telephone Encounter (Signed)
Patient is requesting EOB's be sent to her for the year of 2016 for tax purposes. Please call and advise.

## 2016-10-08 DIAGNOSIS — IMO0002 Reserved for concepts with insufficient information to code with codable children: Secondary | ICD-10-CM | POA: Insufficient documentation

## 2016-10-08 DIAGNOSIS — S8010XA Contusion of unspecified lower leg, initial encounter: Secondary | ICD-10-CM | POA: Insufficient documentation

## 2016-10-08 DIAGNOSIS — J309 Allergic rhinitis, unspecified: Secondary | ICD-10-CM | POA: Insufficient documentation

## 2016-10-09 DIAGNOSIS — J309 Allergic rhinitis, unspecified: Secondary | ICD-10-CM | POA: Diagnosis not present

## 2016-10-09 DIAGNOSIS — S8001XA Contusion of right knee, initial encounter: Secondary | ICD-10-CM | POA: Diagnosis not present

## 2016-10-09 DIAGNOSIS — Z6824 Body mass index (BMI) 24.0-24.9, adult: Secondary | ICD-10-CM | POA: Diagnosis not present

## 2016-10-23 ENCOUNTER — Other Ambulatory Visit: Payer: Self-pay | Admitting: Internal Medicine

## 2016-10-23 ENCOUNTER — Other Ambulatory Visit: Payer: Self-pay | Admitting: Unknown Physician Specialty

## 2016-10-23 DIAGNOSIS — Z1231 Encounter for screening mammogram for malignant neoplasm of breast: Secondary | ICD-10-CM

## 2016-11-03 DIAGNOSIS — G8929 Other chronic pain: Secondary | ICD-10-CM | POA: Diagnosis not present

## 2016-11-03 DIAGNOSIS — G629 Polyneuropathy, unspecified: Secondary | ICD-10-CM | POA: Diagnosis not present

## 2016-11-03 DIAGNOSIS — G890 Central pain syndrome: Secondary | ICD-10-CM | POA: Diagnosis not present

## 2016-11-03 DIAGNOSIS — Z8673 Personal history of transient ischemic attack (TIA), and cerebral infarction without residual deficits: Secondary | ICD-10-CM | POA: Diagnosis not present

## 2016-11-26 ENCOUNTER — Other Ambulatory Visit: Payer: Self-pay | Admitting: Neurology

## 2016-11-27 ENCOUNTER — Ambulatory Visit
Admission: RE | Admit: 2016-11-27 | Discharge: 2016-11-27 | Disposition: A | Discharge status: Home / Self Care | Payer: Medicare HMO | Source: Ambulatory Visit | Attending: Unknown Physician Specialty | Admitting: Unknown Physician Specialty

## 2016-11-27 DIAGNOSIS — Z1231 Encounter for screening mammogram for malignant neoplasm of breast: Secondary | ICD-10-CM

## 2016-12-10 ENCOUNTER — Encounter: Payer: Self-pay | Admitting: Neurology

## 2016-12-10 ENCOUNTER — Ambulatory Visit (INDEPENDENT_AMBULATORY_CARE_PROVIDER_SITE_OTHER): Payer: Medicare HMO | Admitting: Neurology

## 2016-12-10 VITALS — BP 120/86 | HR 83 | Ht 62.5 in | Wt 136.0 lb

## 2016-12-10 DIAGNOSIS — I69354 Hemiplegia and hemiparesis following cerebral infarction affecting left non-dominant side: Secondary | ICD-10-CM

## 2016-12-10 NOTE — Progress Notes (Signed)
[facility]  CC:  Spastic hemiplegia  [name] is a 66  y.o. Right-handed Caucasian female, return for EMG guided Botox injection for her left spastic hemiparesis,  She suffered stroke in 1999, with residual left spastic hemiparesis, upper extremity more than leg, left visual field cut  She previously was enrolled in Botox research study for spastic upper extremity at [facility] by Dr. Marya FossaBrashear, received EMG guided Botox injection in 2010 for about 2 years, with good benefit, but has difficulty with insurance  She began to receive Botox injection through our clinic by Dr. Hosie PoissonSumner since May 2015,150 units total to left upper extremity, which has been very helpful  She is return for continued EMG guided Botox injection, at baseline, she can ambulate with a left AFO, but without assistance,  Profound left upper extremity spasticity, achiness, chronic pain, persistent left wrist flexion, finger flexion,  She also has a history of coronary artery disease in 2007, taking Plavix, and Aggrenox, exercise regularly, has a personal trainer  UPDATE March 2nd 2016: She received 300 units in Jun 13 2014, to left upper extremity, which has been very helpful, she did not notice significant side effect. The injection help her open her left hand better, no significant side effect noticed.  UPDATE June 8th 2016: She received 300 units of Botox A in March second 2016, responded very well, she was able to relax her left hand better, she ambulate without assistance with spastic left hemiparesis, complains of left facial, left foot pain, wants the injection to emphasize on her left finger flexion wrist flexion today.  UPDATE Sep 28th 2016: She responded to previous Botox injection very well in June, she wants to emphasize on her left upper extremity at this time, complains forceful finger contraction, flexion, elbow flexion. Only mild left shoulder pain with passive  movement.  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 Patient is concerned about potential side effect of weakness.  Update May 21 2016: She responded very well to previous injection in July, only wants to receive injection for spastic left upper extremity, she had a pair of left wrist/finger splint,  Update September 09 2016: She is now using a left wrist splint, which has been helpful,  UPDATE Dec 10 2016: She responded well to previous injection no significant side effect noticed,  Social History   Social History  . Marital status: Married    Spouse name: N/A  . Number of children: N/A  . Years of education: N/A   Occupational History  . disabled    Social History Main Topics  . Smoking status: Former Games developermoker  . Smokeless tobacco: Never Used     Comment: Quit in 2005  . Alcohol use Yes     Comment: social  . Drug use: No  . Sexual activity: Not Currently   Other Topics Concern  . Not on file   Social History Narrative   Married, no  children   Right handed   12th grade   1 cup daily    Family History  Problem Relation Age of Onset  . Arrhythmia Father        PPM    Past Medical History:  Diagnosis Date  . AMI (acute myocardial infarction) (HCC) 2007   failed PCI to the distal LAD - managed medically  . CVA (cerebral vascular accident) (HCC)  1999  . Degenerative disc disease, cervical   . Hypothyroidism   . Unstable angina [facility])     Past Surgical History:  Procedure Laterality Date  . ENDOMETRIAL ABLATION  3/06    Current Outpatient Prescriptions  Medication Sig Dispense Refill  . ALPRAZolam (XANAX) 0.5 MG tablet Take 0.5 mg by mouth 2 (two) times daily as needed.      Marland Kitchen aspirin 81 MG tablet Take 81 mg by mouth daily.    Marland Kitchen BOTOX 100 units SOLR injection INJECT AS DIRECTED AT PHYSICIANS OFFICE 5 vial 3  . cetirizine (ZYRTEC) 10 MG tablet Take 10 mg by mouth daily.      . clopidogrel (PLAVIX) 75 MG tablet Take 1 tablet (75 mg total) by mouth daily. 90 tablet 3  . DENTA 5000 PLUS 1.1 % CREA dental cream Place 1 application onto teeth at bedtime.   4  . diphenhydrAMINE (BENADRYL) 25 MG tablet Take 25 mg by mouth every 6 (six) hours as needed.      Marland Kitchen FLUoxetine (PROZAC) 20 MG capsule Take 20 mg by mouth daily.      Marland Kitchen gabapentin (NEURONTIN) 600 MG tablet Take 600 mg by mouth 2 (two) times daily.     Marland Kitchen lactulose, encephalopathy, (CHRONULAC) 10 GM/15ML SOLN Take 10 g by mouth 2 (two) times daily.     Marland Kitchen levothyroxine (SYNTHROID, LEVOTHROID) 75 MCG tablet Take 75 mcg by mouth daily before breakfast.    . LINZESS 145 MCG CAPS capsule Take 145 mcg by mouth daily.     . nitroGLYCERIN (NITROSTAT) 0.4 MG SL tablet Place 1 tablet (0.4 mg total) under the tongue every 5 (five) minutes as needed. 25 tablet 3  . NUCYNTA 75 MG tablet TAKE 1 TABLET BY MOUTH EVERY FOUR TO SIX HOURS AS NEEDED  0  . NUCYNTA ER 50 MG 12 hr tablet TAKE 1 TABLET TWO TIMES DAILY FOR 30 DAYS  0  . OnabotulinumtoxinA (BOTOX IJ) Inject 500 Units as directed every 3 (three) months.    . pantoprazole (PROTONIX) 40 MG tablet Take 40 mg by mouth daily.      . polyethylene glycol powder (GLYCOLAX/MIRALAX) powder MIX 17GM IN 8 OZ. OF WATER DAILY OR TWICE DAILY FOR 30 DAYS  6  . ranolazine (RANEXA) 500 MG 12 hr tablet Take 1 tablet (500 mg total) by mouth as needed. 60 tablet  11  . rOPINIRole (REQUIP) 0.5 MG tablet Take 0.5 mg by mouth Daily.    . simvastatin (ZOCOR) 40 MG tablet Take 40 mg by mouth at bedtime.       No current facility-administered medications for this visit.     Allergies as of 12/10/2016 - Review Complete 12/10/2016  Allergen Reaction Noted  . Aspirin  08/18/2012  . Morphine  01/01/2009  . Penicillins  01/01/2009  . Zolpidem tartrate  01/01/2009    Vitals: BP 120/86   Pulse 83   Ht 5' 2.5" (1.588 m)   Wt 136 lb (61.7 kg)   BMI 24.48 kg/m  Last Weight:  Wt Readings from Last 1  Encounters:  12/10/16 136 lb (61.7 kg)   Last Height:   Ht Readings from Last 1 Encounters:  12/10/16 5' 2.5" (1.588 m)   PHYSICAL EXAMINATOINS:  Generalized: In no acute distress  Neck: Supple, no carotid bruits   Cardiac: Regular rate rhythm  Pulmonary: Clear to auscultation bilaterally  Musculoskeletal: No deformity  Neurological examination  Mentation: Alert oriented to time, place, history taking, and causual conversation, left lower face weakness  Cranial nerve II-XII: Pupils were equal round reactive to light extraocular movements were full, left visual field cut.   Left lower face weakness. hearing was intact to finger rubbing bilaterally. Uvula tongue midline.  head turning and shoulder shrug and were normal and symmetric.Tongue protrusion into cheek strength was normal.  Motor: spastic left hemiparesis,  Wearing left AFO, left lower extremity moderate spasticity, left hip flexion 4, knee flexion 4, extension 5, Profound left upper extremity spasticity, left shoulder adduction, internal rotation.left elbow flexion, pronation, with passive movement, mild fixed contraction of left elbow, maximum 175, left wrist forceful flexion, finger flexion,thumb in position, even with forceful movement, she could not achieve full extension of her left finger  Gait: Rising up from seated position by pushing on chair arm,spastic left  hemi-circumferential gait.  Deep tendon reflexes: hyperreflexia of left side   Assessment and plan:   66 years old right-handed Caucasian female, status post stroke, with spastic left hemiparesis since 1999, responded very well to previous EMG guided Botox injection, return to clinic for repeat injection  Under electric stimulation, 500 units of Botox A was injected into left upper extremity muscles  Left pronator teres 25 x2 =50 units Left flexor carpi radialis 50 units Left  flexor carpi ulnaris 25 x2=50units Left flexor digitorum profundus 25x2=50 units Left opponens 25 units Left pronator quadratus 25 units  Left palmaris longus 25 x2=50 units  Left brachialis 100  units Left pectoralis major 25x2=50 units Left teres major 50 units  She tolerated the injection well,Return to clinic in 3 months for repeat injection, she will need 500 units BOTOX A for next injection.  She would benefit the left wrist hand orthosis for better control of her left arm/hand spasticity  [name], M.D. Ph.D.  [facility] [address] [place], [place] [address] Phone: [phone] Fax:      [phone]

## 2016-12-10 NOTE — Progress Notes (Signed)
**  Botox 100 units x 5 vials, NDC [id number], Lot [id number], Exp 06/2019, specialty pharmacy.//[email or web address]**

## 2017-01-05 DIAGNOSIS — G8929 Other chronic pain: Secondary | ICD-10-CM | POA: Diagnosis not present

## 2017-01-05 DIAGNOSIS — Z79891 Long term (current) use of opiate analgesic: Secondary | ICD-10-CM | POA: Diagnosis not present

## 2017-01-05 DIAGNOSIS — G890 Central pain syndrome: Secondary | ICD-10-CM | POA: Diagnosis not present

## 2017-01-05 DIAGNOSIS — G629 Polyneuropathy, unspecified: Secondary | ICD-10-CM | POA: Diagnosis not present

## 2017-01-13 DIAGNOSIS — R69 Illness, unspecified: Secondary | ICD-10-CM | POA: Diagnosis not present

## 2017-01-13 DIAGNOSIS — E039 Hypothyroidism, unspecified: Secondary | ICD-10-CM | POA: Diagnosis not present

## 2017-01-13 DIAGNOSIS — G2581 Restless legs syndrome: Secondary | ICD-10-CM | POA: Diagnosis not present

## 2017-01-13 DIAGNOSIS — E7800 Pure hypercholesterolemia, unspecified: Secondary | ICD-10-CM | POA: Diagnosis not present

## 2017-01-21 DIAGNOSIS — E7800 Pure hypercholesterolemia, unspecified: Secondary | ICD-10-CM | POA: Diagnosis not present

## 2017-01-21 DIAGNOSIS — Z0000 Encounter for general adult medical examination without abnormal findings: Secondary | ICD-10-CM | POA: Diagnosis not present

## 2017-01-21 DIAGNOSIS — F3289 Other specified depressive episodes: Secondary | ICD-10-CM | POA: Diagnosis not present

## 2017-01-21 DIAGNOSIS — G2581 Restless legs syndrome: Secondary | ICD-10-CM | POA: Diagnosis not present

## 2017-01-21 DIAGNOSIS — Z6823 Body mass index (BMI) 23.0-23.9, adult: Secondary | ICD-10-CM | POA: Diagnosis not present

## 2017-01-21 DIAGNOSIS — Z1389 Encounter for screening for other disorder: Secondary | ICD-10-CM | POA: Diagnosis not present

## 2017-01-21 DIAGNOSIS — E039 Hypothyroidism, unspecified: Secondary | ICD-10-CM | POA: Diagnosis not present

## 2017-01-21 DIAGNOSIS — R69 Illness, unspecified: Secondary | ICD-10-CM | POA: Diagnosis not present

## 2017-02-16 DIAGNOSIS — Z79891 Long term (current) use of opiate analgesic: Secondary | ICD-10-CM | POA: Diagnosis not present

## 2017-02-16 DIAGNOSIS — Z79899 Other long term (current) drug therapy: Secondary | ICD-10-CM | POA: Diagnosis not present

## 2017-02-16 DIAGNOSIS — G890 Central pain syndrome: Secondary | ICD-10-CM | POA: Diagnosis not present

## 2017-02-16 DIAGNOSIS — M542 Cervicalgia: Secondary | ICD-10-CM | POA: Diagnosis not present

## 2017-02-16 DIAGNOSIS — G894 Chronic pain syndrome: Secondary | ICD-10-CM | POA: Diagnosis not present

## 2017-02-23 ENCOUNTER — Telehealth: Payer: Self-pay | Admitting: Neurology

## 2017-02-23 NOTE — Telephone Encounter (Signed)
Michael/[employer] [facility] [phone] confirmed delivery for botox 02/24/17

## 2017-02-24 NOTE — Telephone Encounter (Signed)
Noted, thank you

## 2017-03-03 DIAGNOSIS — L89892 Pressure ulcer of other site, stage 2: Secondary | ICD-10-CM | POA: Diagnosis not present

## 2017-03-03 DIAGNOSIS — M79672 Pain in left foot: Secondary | ICD-10-CM | POA: Diagnosis not present

## 2017-03-26 DIAGNOSIS — G894 Chronic pain syndrome: Secondary | ICD-10-CM | POA: Diagnosis not present

## 2017-03-26 DIAGNOSIS — M542 Cervicalgia: Secondary | ICD-10-CM | POA: Diagnosis not present

## 2017-03-26 DIAGNOSIS — Z79891 Long term (current) use of opiate analgesic: Secondary | ICD-10-CM | POA: Diagnosis not present

## 2017-03-26 DIAGNOSIS — Z79899 Other long term (current) drug therapy: Secondary | ICD-10-CM | POA: Diagnosis not present

## 2017-03-26 DIAGNOSIS — G890 Central pain syndrome: Secondary | ICD-10-CM | POA: Diagnosis not present

## 2017-03-31 ENCOUNTER — Ambulatory Visit: Payer: Medicare HMO | Admitting: Neurology

## 2017-04-14 ENCOUNTER — Encounter: Payer: Self-pay | Admitting: Neurology

## 2017-04-14 ENCOUNTER — Ambulatory Visit (INDEPENDENT_AMBULATORY_CARE_PROVIDER_SITE_OTHER): Payer: Medicare HMO | Admitting: Neurology

## 2017-04-14 VITALS — BP 133/77 | HR 68 | Ht 62.5 in | Wt 132.8 lb

## 2017-04-14 DIAGNOSIS — I6350 Cerebral infarction due to unspecified occlusion or stenosis of unspecified cerebral artery: Secondary | ICD-10-CM | POA: Diagnosis not present

## 2017-04-14 DIAGNOSIS — I69354 Hemiplegia and hemiparesis following cerebral infarction affecting left non-dominant side: Secondary | ICD-10-CM | POA: Diagnosis not present

## 2017-04-14 DIAGNOSIS — M792 Neuralgia and neuritis, unspecified: Secondary | ICD-10-CM | POA: Diagnosis not present

## 2017-04-14 MED ORDER — DULOXETINE HCL 60 MG PO CPEP: 60.0000 mg | ORAL_CAPSULE | Freq: Every day | ORAL | 12 refills | Status: DC

## 2017-04-14 NOTE — Progress Notes (Signed)
[facility]  CC:  Spastic hemiplegia  [name] is a 66  y.o. Right-handed Caucasian female, return for EMG guided Botox injection for Dawn left spastic hemiparesis,  She suffered stroke in 1999, with residual left spastic hemiparesis, upper extremity more than leg, left visual field cut  She previously was enrolled in Botox research study for spastic upper extremity at [facility] by Dr. Marya FossaBrashear, received EMG guided Botox injection in 2010 for about 2 years, with good benefit, but has difficulty with insurance  She began to receive Botox injection through our clinic by Dr. Hosie PoissonSumner since May 2015,150 units total to left upper extremity, which has been very helpful  She is return for continued EMG guided Botox injection, at baseline, she can ambulate with a left AFO, but without assistance,  Profound left upper extremity spasticity, achiness, chronic pain, persistent left wrist flexion, finger flexion,  She also has a history of coronary artery disease in 2007, taking Plavix, and Aggrenox, exercise regularly, has a personal trainer  UPDATE March 2nd 2016: She received 300 units in Jun 13 2014, to left upper extremity, which has been very helpful, she did not notice significant side effect. The injection help Dawn open Dawn left hand better, no significant side effect noticed.  UPDATE June 8th 2016: She received 300 units of Botox A in March second 2016, responded very well, she was able to relax Dawn left hand better, she ambulate without assistance with spastic left hemiparesis, complains of left facial, left foot pain, wants the injection to emphasize on Dawn left finger flexion wrist flexion today.  UPDATE Sep 28th 2016: She responded to previous Botox injection very well in June, she wants to emphasize on Dawn left upper extremity at this time, complains forceful finger contraction, flexion, elbow flexion. Only mild left shoulder pain with passive  movement.  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 Patient is concerned about potential side effect of weakness.  Update May 21 2016: She responded very well to previous injection in July, only wants to receive injection for spastic left upper extremity, she had a pair of left wrist/finger splint,  Update September 09 2016: She is now using a left wrist splint, which has been helpful,  UPDATE Dec 10 2016: She responded well to previous injection no significant side effect noticed,   UPDATE Apr 14 2017: She wear left wrist pain, denies significant side effect, complains of left-sided neuropathic pain, despite Prozac 20 mg, Nucynta 50 mg, gabapentin 600 mg twice a day,  Social History   Social History  . Marital status: Married    Spouse name: N/A  . Number of children: N/A  . Years of education: N/A   Occupational History  . disabled    Social History Main Topics  . Smoking status: Former Games developermoker  . Smokeless tobacco: Never Used     Comment: Quit in 2005  . Alcohol use Yes  Comment: social  . Drug use: No  . Sexual activity: Not Currently   Other Topics Concern  . Not on file   Social History Narrative   Married, no children   Right handed   12th grade   1 cup daily    Family History  Problem Relation Age of Onset  . Arrhythmia Father         PPM    Past Medical History:  Diagnosis Date  . AMI (acute myocardial infarction) (HCC) 2007   failed PCI to the distal LAD - managed medically  . CVA (cerebral vascular accident) (HCC) 1999  . Degenerative disc disease, cervical   . Hypothyroidism   . Unstable angina [facility])     Past Surgical History:  Procedure Laterality Date  . ENDOMETRIAL ABLATION  3/06    Current Outpatient Prescriptions  Medication Sig Dispense Refill  . ALPRAZolam (XANAX) 0.5 MG tablet Take 0.5 mg by mouth 2 (two) times daily as needed.      Marland Kitchen aspirin 81 MG tablet Take 81 mg by mouth daily.    Marland Kitchen BOTOX 100 units SOLR injection INJECT AS DIRECTED AT PHYSICIANS OFFICE 5 vial 3  . cetirizine (ZYRTEC) 10 MG tablet Take 10 mg by mouth daily.      . clopidogrel (PLAVIX) 75 MG tablet Take 1 tablet (75 mg total) by mouth daily. 90 tablet 3  . DENTA 5000 PLUS 1.1 % CREA dental cream Place 1 application onto teeth at bedtime.   4  . diphenhydrAMINE (BENADRYL) 25 MG tablet Take 25 mg by mouth every 6 (six) hours as needed.      Marland Kitchen FLUoxetine (PROZAC) 20 MG capsule Take 20 mg by mouth daily.      Marland Kitchen gabapentin (NEURONTIN) 600 MG tablet Take 600 mg by mouth 2 (two) times daily.     Marland Kitchen levothyroxine (SYNTHROID, LEVOTHROID) 75 MCG tablet Take 75 mcg by mouth daily before breakfast.    . LINZESS 145 MCG CAPS capsule Take 145 mcg by mouth daily.     . nitroGLYCERIN (NITROSTAT) 0.4 MG SL tablet Place 1 tablet (0.4 mg total) under the tongue every 5 (five) minutes as needed. 25 tablet 3  . NUCYNTA ER 50 MG 12 hr tablet TAKE 1 TABLET TWO TIMES DAILY FOR 30 DAYS  0  . OnabotulinumtoxinA (BOTOX IJ) Inject 500 Units as directed every 3 (three) months.    . pantoprazole (PROTONIX) 40 MG tablet Take 40 mg by mouth daily.      . polyethylene glycol powder (GLYCOLAX/MIRALAX) powder MIX 17GM IN 8 OZ. OF WATER DAILY OR TWICE DAILY FOR 30 DAYS  6  . ranolazine (RANEXA) 500 MG 12 hr tablet Take 1 tablet (500 mg total) by mouth as needed. 60  tablet 11  . rOPINIRole (REQUIP) 0.5 MG tablet Take 0.5 mg by mouth Daily.    . simvastatin (ZOCOR) 40 MG tablet Take 40 mg by mouth at bedtime.      . tapentadol (NUCYNTA) 50 MG tablet Take 50 mg by mouth.     No current facility-administered medications for this visit.     Allergies as of 04/14/2017 - Review Complete 04/14/2017  Allergen Reaction Noted  . Aspirin  08/18/2012  . Morphine  01/01/2009  . Penicillins  01/01/2009  . Zolpidem tartrate  01/01/2009    Vitals: BP 133/77   Pulse 68   Ht 5' 2.5" (1.588 m)   Wt 132 lb 12 oz (60.2 kg)   BMI 23.89 kg/m  Last Weight:  Wt Readings from Last 1 Encounters:  04/14/17 132 lb 12 oz (60.2 kg)   Last Height:   Ht Readings from Last 1 Encounters:  04/14/17 5' 2.5" (1.588 m)   PHYSICAL EXAMINATOINS:  Generalized: In no acute distress  Neck: Supple, no carotid bruits   Cardiac: Regular rate rhythm  Pulmonary: Clear to auscultation bilaterally  Musculoskeletal: No deformity  Neurological examination  Mentation: Alert oriented to time, place, history taking, and causual conversation, left lower face weakness  Cranial nerve II-XII: Pupils were equal round reactive to light extraocular movements were full, left visual field cut.   Left lower face weakness. hearing was intact to finger rubbing bilaterally. Uvula tongue midline.  head turning and shoulder shrug and were normal and symmetric.Tongue protrusion into cheek strength was normal.  Motor: spastic left hemiparesis,  Wearing left AFO, left lower extremity moderate spasticity, left hip flexion 4, knee flexion 4, extension 5, Profound left upper extremity spasticity, left shoulder adduction, internal rotation.left elbow flexion, pronation, with passive movement, mild fixed contraction of left elbow, maximum 175, left wrist forceful flexion, finger flexion,thumb in position, even with forceful movement, she could not achieve full extension of Dawn left finger  Gait: Rising  up from seated position by pushing on chair arm,spastic left hemi-circumferential gait.  Deep tendon reflexes: hyperreflexia of left side   Assessment and plan:   66 years old right-handed Caucasian female, status post stroke, with spastic left hemiparesis since 1999, responded very well to previous EMG guided Botox injection, return to clinic for repeat injection  Under electric stimulation, 500 units of Botox A was injected into left upper extremity muscles  Left pronator teres 25 x2 =50 units Left flexor carpi radialis 50 units Left  flexor carpi ulnaris 25 x2=50units Left flexor digitorum profundus 25x2=50 units Left pronator quadratus 25 units  Left palmaris longus 25 x2=50 units  Left brachialis 100  units Left pectoralis major 25x2=50 units Left teres major 50 units Left latissimus dorsi 50 units  She tolerated the injection well,Return to clinic in 3 months for repeat injection, she will need 500 units BOTOX A for next injection.   Left-sided neuropathic pain, polypharmacy treatment I have add on Cymbalta 60 mg daily, keep current dose of Nucynta 50 mg daily, Prozac 20 mg every day, gabapentin 600 mg twice a day    [name], M.D. Ph.D.  [facility] [address] [place], [place] [address] Phone: [phone] Fax:      [phone]

## 2017-04-14 NOTE — Progress Notes (Signed)
**  Botox 100 units x 5 vials, NDC 0023-1145-01, Lot [id number], Exp 08/2019, specialty pharmacy.//mck,rn** 

## 2017-04-14 NOTE — Addendum Note (Signed)
Addended by: Levert FeinsteinYAN, Alex Leahy on: 04/14/2017 01:48 PM   Modules accepted: Level of Service

## 2017-04-20 DIAGNOSIS — G894 Chronic pain syndrome: Secondary | ICD-10-CM | POA: Diagnosis not present

## 2017-04-20 DIAGNOSIS — G890 Central pain syndrome: Secondary | ICD-10-CM | POA: Diagnosis not present

## 2017-04-20 DIAGNOSIS — Z79899 Other long term (current) drug therapy: Secondary | ICD-10-CM | POA: Diagnosis not present

## 2017-04-20 DIAGNOSIS — M542 Cervicalgia: Secondary | ICD-10-CM | POA: Diagnosis not present

## 2017-04-20 DIAGNOSIS — Z79891 Long term (current) use of opiate analgesic: Secondary | ICD-10-CM | POA: Diagnosis not present

## 2017-05-17 DIAGNOSIS — M542 Cervicalgia: Secondary | ICD-10-CM | POA: Diagnosis not present

## 2017-05-17 DIAGNOSIS — Z79891 Long term (current) use of opiate analgesic: Secondary | ICD-10-CM | POA: Diagnosis not present

## 2017-05-17 DIAGNOSIS — Z79899 Other long term (current) drug therapy: Secondary | ICD-10-CM | POA: Diagnosis not present

## 2017-05-17 DIAGNOSIS — G890 Central pain syndrome: Secondary | ICD-10-CM | POA: Diagnosis not present

## 2017-05-17 DIAGNOSIS — G894 Chronic pain syndrome: Secondary | ICD-10-CM | POA: Diagnosis not present

## 2017-06-10 DIAGNOSIS — R69 Illness, unspecified: Secondary | ICD-10-CM | POA: Diagnosis not present

## 2017-06-14 DIAGNOSIS — G890 Central pain syndrome: Secondary | ICD-10-CM | POA: Diagnosis not present

## 2017-06-14 DIAGNOSIS — Z79899 Other long term (current) drug therapy: Secondary | ICD-10-CM | POA: Diagnosis not present

## 2017-06-14 DIAGNOSIS — M542 Cervicalgia: Secondary | ICD-10-CM | POA: Diagnosis not present

## 2017-06-14 DIAGNOSIS — Z79891 Long term (current) use of opiate analgesic: Secondary | ICD-10-CM | POA: Diagnosis not present

## 2017-06-14 DIAGNOSIS — G894 Chronic pain syndrome: Secondary | ICD-10-CM | POA: Diagnosis not present

## 2017-06-23 ENCOUNTER — Telehealth: Payer: Self-pay | Admitting: Neurology

## 2017-06-23 NOTE — Telephone Encounter (Signed)
Spoke to patient - says her weight loss has been gradual since August 2018.  The other symptoms of not sleeping well and feeling jittery started about one week ago.  She also stated she fell yesterday but did not get injured.  She has been taking duloxetine since early September 2018.  Despite the amount of time on this medication, she still felt her symptoms were related and stopped taking it on 06/22/17.  Says she woke up feeling better this morning. She will discontinue this medication and keep her pending follow up with Dr. Terrace ArabiaYan on 07/14/17.

## 2017-06-23 NOTE — Telephone Encounter (Signed)
Patient calling to discuss DULoxetine (CYMBALTA) 60 MG capsule. She says she is not sleeping, feels jittery, not thinking straight, lost 7 pounds and fell yesterday. I advised Dr. Terrace ArabiaYan is not in the office but will send to her nurse.

## 2017-06-23 NOTE — Addendum Note (Signed)
Addended by: Lindell SparKIRKMAN, MICHELLE C on: 06/23/2017 04:25 PM   Modules accepted: Orders

## 2017-07-01 DIAGNOSIS — A088 Other specified intestinal infections: Secondary | ICD-10-CM | POA: Insufficient documentation

## 2017-07-01 DIAGNOSIS — M7732 Calcaneal spur, left foot: Secondary | ICD-10-CM | POA: Diagnosis not present

## 2017-07-01 DIAGNOSIS — Z6822 Body mass index (BMI) 22.0-22.9, adult: Secondary | ICD-10-CM | POA: Diagnosis not present

## 2017-07-01 DIAGNOSIS — M79672 Pain in left foot: Secondary | ICD-10-CM | POA: Diagnosis not present

## 2017-07-01 DIAGNOSIS — A084 Viral intestinal infection, unspecified: Secondary | ICD-10-CM | POA: Diagnosis not present

## 2017-07-14 ENCOUNTER — Ambulatory Visit: Payer: Medicare HMO | Admitting: Neurology

## 2017-07-14 ENCOUNTER — Encounter: Payer: Self-pay | Admitting: Neurology

## 2017-07-14 ENCOUNTER — Encounter (INDEPENDENT_AMBULATORY_CARE_PROVIDER_SITE_OTHER): Payer: Self-pay

## 2017-07-14 VITALS — BP 114/72 | HR 76 | Ht 62.5 in | Wt 136.5 lb

## 2017-07-14 DIAGNOSIS — I69354 Hemiplegia and hemiparesis following cerebral infarction affecting left non-dominant side: Secondary | ICD-10-CM | POA: Diagnosis not present

## 2017-07-14 DIAGNOSIS — I6350 Cerebral infarction due to unspecified occlusion or stenosis of unspecified cerebral artery: Secondary | ICD-10-CM

## 2017-07-14 NOTE — Progress Notes (Signed)
[facility]  CC:  Spastic hemiplegia  Horald PollenJane G Weinhold is a 66  y.o. Right-handed Caucasian female, return for EMG guided Botox injection for her left spastic hemiparesis,  She suffered stroke in 1999, with residual left spastic hemiparesis, upper extremity more than leg, left visual field cut  She previously was enrolled in Botox research study for spastic upper extremity at [facility] by Dr. Marya FossaBrashear, received EMG guided Botox injection in 2010 for about 2 years, with good benefit, but has difficulty with insurance  She began to receive Botox injection through our clinic by Dr. Hosie PoissonSumner since May 2015,150 units total to left upper extremity, which has been very helpful  She is return for continued EMG guided Botox injection, at baseline, she can ambulate with a left AFO, but without assistance,  Profound left upper extremity spasticity, achiness, chronic pain, persistent left wrist flexion, finger flexion,  She also has a history of coronary artery disease in 2007, taking Plavix, and Aggrenox, exercise regularly, has a personal trainer  UPDATE March 2nd 2016: She received 300 units in Jun 13 2014, to left upper extremity, which has been very helpful, she did not notice significant side effect. The injection help her open her left hand better, no significant side effect noticed.  UPDATE June 8th 2016: She received 300 units of Botox A in March second 2016, responded very well, she was able to relax her left hand better, she ambulate without assistance with spastic left hemiparesis, complains of left facial, left foot pain, wants the injection to emphasize on her left finger flexion wrist flexion today.  UPDATE Sep 28th 2016: She responded to previous Botox injection very well in June, she wants to emphasize on her left upper extremity at this time, complains forceful finger contraction, flexion, elbow flexion. Only mild left shoulder pain with passive  movement.  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 Patient is concerned about potential side effect of weakness.  Update May 21 2016: She responded very well to previous injection in July, only wants to receive injection for spastic left upper extremity, she had a pair of left wrist/finger splint,  Update September 09 2016: She is now using a left wrist splint, which has been helpful,  UPDATE Dec 10 2016: She responded well to previous injection no significant side effect noticed,   UPDATE Apr 14 2017: She wear left wrist pain, denies significant side effect, complains of left-sided neuropathic pain, despite Prozac 20 mg, Nucynta 50 mg, gabapentin 600 mg twice a day,  UPDATE Jul 14 2017: She responded very well to previous injection in September 2018,  Social History   Socioeconomic History  . Marital status: Married    Spouse name: Not on file  . Number of children: Not on file  . Years of education: Not on file  . Highest education level: Not on file  Social Needs  . Financial resource strain: Not on file  .  Food insecurity - worry: Not on file  . Food insecurity - inability: Not on file  . Transportation needs - medical: Not on file  . Transportation needs - non-medical: Not on file  Occupational History  . Occupation: disabled  Tobacco Use  . Smoking  status: Former Games developermoker  . Smokeless tobacco: Never Used  . Tobacco comment: Quit in 2005  Substance and Sexual Activity  . Alcohol use: Yes    Comment: social  . Drug use: No  . Sexual activity: Not Currently  Other Topics Concern  . Not on file  Social History Narrative   Married, no children   Right handed   12th grade   1 cup daily    Family History  Problem Relation Age of Onset  . Arrhythmia Father        PPM    Past Medical History:  Diagnosis Date  . AMI (acute myocardial infarction) (HCC) 2007   failed PCI to the distal LAD - managed medically  . CVA (cerebral vascular accident) (HCC) 1999  . Degenerative disc disease, cervical   . Hypothyroidism   . Unstable angina [facility](HCC)     Past Surgical History:  Procedure Laterality Date  . ENDOMETRIAL ABLATION  3/06    Current Outpatient Medications  Medication Sig Dispense Refill  . ALPRAZolam (XANAX) 0.5 MG tablet Take 0.5 mg by mouth 2 (two) times daily as needed.      Marland Kitchen. aspirin 81 MG tablet Take 81 mg by mouth daily.    Marland Kitchen. BOTOX 100 units SOLR injection INJECT AS DIRECTED AT PHYSICIANS OFFICE 5 vial 3  . cetirizine (ZYRTEC) 10 MG tablet Take 10 mg by mouth daily.      . clopidogrel (PLAVIX) 75 MG tablet Take 1 tablet (75 mg total) by mouth daily. 90 tablet 3  . DENTA 5000 PLUS 1.1 % CREA dental cream Place 1 application onto teeth at bedtime.   4  . diphenhydrAMINE (BENADRYL) 25 MG tablet Take 25 mg by mouth every 6 (six) hours as needed.      Marland Kitchen. FLUoxetine (PROZAC) 20 MG capsule Take 20 mg by mouth daily.      Marland Kitchen. gabapentin (NEURONTIN) 600 MG tablet Take 600 mg by mouth 2 (two) times daily.     Marland Kitchen. levothyroxine (SYNTHROID, LEVOTHROID) 75 MCG tablet Take 75 mcg by mouth daily before breakfast.    . LINZESS 145 MCG CAPS capsule Take 145 mcg by mouth daily.     . nitroGLYCERIN (NITROSTAT) 0.4 MG SL tablet Place 1 tablet (0.4 mg total) under the tongue every 5 (five) minutes as needed. 25 tablet 3  . NUCYNTA ER 50 MG 12 hr  tablet TAKE 1 TABLET TWO TIMES DAILY FOR 30 DAYS  0  . OnabotulinumtoxinA (BOTOX IJ) Inject 500 Units as directed every 3 (three) months.    . pantoprazole (PROTONIX) 40 MG tablet Take 40 mg by mouth daily.      . polyethylene glycol powder (GLYCOLAX/MIRALAX) powder MIX 17GM IN 8 OZ. OF WATER DAILY OR TWICE DAILY FOR 30 DAYS  6  . ranolazine (RANEXA) 500 MG 12 hr tablet Take 1 tablet (500 mg total) by mouth as needed. 60 tablet 11  . rOPINIRole (REQUIP) 0.5 MG tablet Take 0.5 mg by mouth Daily.    . simvastatin (ZOCOR) 40 MG tablet Take 40 mg by mouth at bedtime.      . tapentadol (NUCYNTA) 50 MG tablet Take 50 mg by mouth.     No current  facility-administered medications for this visit.     Allergies as of 07/14/2017 - Review Complete 07/14/2017  Allergen Reaction Noted  . Aspirin  08/18/2012  . Morphine  01/01/2009  . Penicillins  01/01/2009  . Zolpidem tartrate  01/01/2009    Vitals: BP 114/72   Pulse 76   Ht 5' 2.5" (1.588 m)   Wt 136 lb 8 oz (61.9 kg)   BMI 24.57 kg/m  Last Weight:  Wt Readings from Last 1 Encounters:  07/14/17 136 lb 8 oz (61.9 kg)   Last Height:   Ht Readings from Last 1 Encounters:  07/14/17 5' 2.5" (1.588 m)   PHYSICAL EXAMINATOINS:  Generalized: In no acute distress  Neck: Supple, no carotid bruits   Cardiac: Regular rate rhythm  Pulmonary: Clear to auscultation bilaterally  Musculoskeletal: No deformity  Neurological examination  Mentation: Alert oriented to time, place, history taking, and causual conversation, left lower face weakness  Cranial nerve II-XII: Pupils were equal round reactive to light extraocular movements were full, left visual field cut.   Left lower face weakness. hearing was intact to finger rubbing bilaterally. Uvula tongue midline.  head turning and shoulder shrug and were normal and symmetric.Tongue protrusion into cheek strength was normal.  Motor: spastic left hemiparesis,  Wearing left AFO, left lower extremity  moderate spasticity, left hip flexion 4, knee flexion 4, extension 5, Profound left upper extremity spasticity, left shoulder adduction, internal rotation.left elbow flexion, pronation, with passive movement, mild fixed contraction of left elbow, maximum 175, left wrist forceful flexion, finger flexion,thumb in position, even with forceful movement, she could not achieve full extension of her left finger  Gait: Rising up from seated position by pushing on chair arm,spastic left hemi-circumferential gait.  Deep tendon reflexes: hyperreflexia of left side   Assessment and plan:   66 years old right-handed Caucasian female, status post stroke, with spastic left hemiparesis since 1999, responded very well to previous EMG guided Botox injection, return to clinic for repeat injection  Under electric stimulation, 500 units of Botox A was injected into left upper extremity muscles  Left pronator teres 25 x2 =50 units Left flexor carpi radialis 50 units Left  flexor carpi ulnaris 25 x2=50units Left flexor digitorum profundus 25x2=50 units  Left palmaris longus 25 x2=50 units  Left brachialis 50 units Left pectoralis major 25x2=50 units Left teres major 50 units Left latissimus dorsi 25 units  Left tibialis posterior 50 units Left flexor digitorum longus 50 units  She tolerated the injection well,Return to clinic in 3 months for repeat injection, she will need 500 units BOTOX A for next injection.    [name], M.D. Ph.D.  [facility] [address] [place], [place] [address] Phone: [phone] Fax:      [phone]

## 2017-07-14 NOTE — Progress Notes (Signed)
**  Botox 100 units x 5 vials, NDC 1610-9604-540023-1145-01, Lot [id number], Exp 10/2019, specialty pharmacy.//mck,rn**

## 2017-07-19 DIAGNOSIS — G894 Chronic pain syndrome: Secondary | ICD-10-CM | POA: Diagnosis not present

## 2017-07-19 DIAGNOSIS — G890 Central pain syndrome: Secondary | ICD-10-CM | POA: Diagnosis not present

## 2017-07-19 DIAGNOSIS — Z79899 Other long term (current) drug therapy: Secondary | ICD-10-CM | POA: Diagnosis not present

## 2017-07-19 DIAGNOSIS — Z79891 Long term (current) use of opiate analgesic: Secondary | ICD-10-CM | POA: Diagnosis not present

## 2017-07-19 DIAGNOSIS — M542 Cervicalgia: Secondary | ICD-10-CM | POA: Diagnosis not present

## 2017-07-22 DIAGNOSIS — Z6823 Body mass index (BMI) 23.0-23.9, adult: Secondary | ICD-10-CM | POA: Diagnosis not present

## 2017-07-22 DIAGNOSIS — I639 Cerebral infarction, unspecified: Secondary | ICD-10-CM | POA: Diagnosis not present

## 2017-07-22 DIAGNOSIS — Z0001 Encounter for general adult medical examination with abnormal findings: Secondary | ICD-10-CM | POA: Diagnosis not present

## 2017-07-22 DIAGNOSIS — E039 Hypothyroidism, unspecified: Secondary | ICD-10-CM | POA: Diagnosis not present

## 2017-07-22 DIAGNOSIS — E7800 Pure hypercholesterolemia, unspecified: Secondary | ICD-10-CM | POA: Diagnosis not present

## 2017-07-22 DIAGNOSIS — G2581 Restless legs syndrome: Secondary | ICD-10-CM | POA: Diagnosis not present

## 2017-07-29 DIAGNOSIS — Z6822 Body mass index (BMI) 22.0-22.9, adult: Secondary | ICD-10-CM | POA: Diagnosis not present

## 2017-07-29 DIAGNOSIS — K648 Other hemorrhoids: Secondary | ICD-10-CM | POA: Diagnosis not present

## 2017-08-16 DIAGNOSIS — Z79899 Other long term (current) drug therapy: Secondary | ICD-10-CM | POA: Diagnosis not present

## 2017-08-16 DIAGNOSIS — G890 Central pain syndrome: Secondary | ICD-10-CM | POA: Diagnosis not present

## 2017-08-16 DIAGNOSIS — Z79891 Long term (current) use of opiate analgesic: Secondary | ICD-10-CM | POA: Diagnosis not present

## 2017-08-16 DIAGNOSIS — G894 Chronic pain syndrome: Secondary | ICD-10-CM | POA: Diagnosis not present

## 2017-08-16 DIAGNOSIS — M542 Cervicalgia: Secondary | ICD-10-CM | POA: Diagnosis not present

## 2017-08-17 ENCOUNTER — Telehealth: Payer: Self-pay | Admitting: Neurology

## 2017-08-17 MED ORDER — DULOXETINE HCL 60 MG PO CPEP: 60.0000 mg | ORAL_CAPSULE | Freq: Every day | ORAL | 11 refills | Status: DC

## 2017-08-17 NOTE — Telephone Encounter (Signed)
Pt is asking for a call back re: her going to the pain management dr on yesterday and they are wanting her back on Cymbalta.  Please call

## 2017-08-17 NOTE — Telephone Encounter (Signed)
Patient went to pain management and it was recommend she restart duloxetine 60mg  daily.  Dr. Terrace ArabiaYan has previously prescribed this medication for her and she was asked to call our office for the refill.  Per vo by Dr. Terrace ArabiaYan, okay to send in new prescription to her pharmacy.  Patient was appreciative of the help.

## 2017-08-17 NOTE — Addendum Note (Signed)
Addended by: Lindell SparKIRKMAN, MICHELLE C on: 08/17/2017 04:20 PM   Modules accepted: Orders

## 2017-08-23 ENCOUNTER — Encounter: Payer: Self-pay | Admitting: Gastroenterology

## 2017-08-24 DIAGNOSIS — S62353A Nondisplaced fracture of shaft of third metacarpal bone, left hand, initial encounter for closed fracture: Secondary | ICD-10-CM | POA: Diagnosis not present

## 2017-08-24 DIAGNOSIS — S62329A Displaced fracture of shaft of unspecified metacarpal bone, initial encounter for closed fracture: Secondary | ICD-10-CM | POA: Insufficient documentation

## 2017-08-30 DIAGNOSIS — K6289 Other specified diseases of anus and rectum: Secondary | ICD-10-CM | POA: Diagnosis not present

## 2017-08-31 ENCOUNTER — Ambulatory Visit: Payer: Medicare HMO | Admitting: Nurse Practitioner

## 2017-08-31 ENCOUNTER — Encounter: Payer: Self-pay | Admitting: Nurse Practitioner

## 2017-08-31 VITALS — BP 110/80 | HR 86 | Ht 62.5 in | Wt 130.4 lb

## 2017-08-31 DIAGNOSIS — I259 Chronic ischemic heart disease, unspecified: Secondary | ICD-10-CM

## 2017-08-31 DIAGNOSIS — E7800 Pure hypercholesterolemia, unspecified: Secondary | ICD-10-CM | POA: Diagnosis not present

## 2017-08-31 MED ORDER — NITROGLYCERIN 0.4 MG SL SUBL: 0.4000 mg | SUBLINGUAL_TABLET | SUBLINGUAL | 3 refills | Status: DC | PRN

## 2017-08-31 MED ORDER — RANOLAZINE ER 500 MG PO TB12: 500.0000 mg | ORAL_TABLET | ORAL | 11 refills | Status: DC | PRN

## 2017-08-31 NOTE — Progress Notes (Signed)
CARDIOLOGY OFFICE NOTE  Date:  08/31/2017    [name] Date of Birth: Feb 05, 1951 Medical Record #[id number]  PCP:  Selinda Flavin, MD  Cardiologist:  Tyrone Sage   Chief Complaint  Patient presents with  . Coronary Artery Disease  . Hyperlipidemia    1 year check     History of Present Illness: [name] is a 67 y.o. female who presents today for a one year check. She is a former patient of Dr. Vern Claude - now following by me.   She has a history of CAD with past MI in 2007 and had failed PCI to the distal LAD - managed medically - has used "PRN" Ranexa since that time. Other issues include prior CVA from 1999, hypothyroidism, HLD and GERD.  When seen in January of 2017 was having some chest pain. Her exercise tolerance was stable. We did get her Myoview updated and this looked ok. She remains mostly limited by significant nerve pain from a remote stroke but she continues to "press on" in quite an amazing fashion. Last visit with me was in January of 2018 - stable from our standpoint.   Comes in today. Here with her husband today. He has retired. He is her primary caregiver. She continues to do pretty well. Still going to the gym several times a week. Able to walk a mile in 30 minutes. Did suffer a fall last Monday - tripped on a rug - broke her left hand - this is the hand that "does not work well anyway" - so no cast. She notes no pain. Her chest pain is stable. She has not used NTG in quite some time. She does still use prn Ranexa as needed. This helps her tremendously - she has done this every since she was seen by Dr. Daleen Squibb. Her spirit is pretty good. She is happy with how she is doing overall.   Past Medical History:  Diagnosis Date  . AMI (acute myocardial infarction) (HCC) 2007   failed PCI to the distal LAD - managed medically  . CVA (cerebral vascular accident) (HCC) 1999  . Degenerative disc disease, cervical   . Hypothyroidism   . Unstable angina [facility])      Past Surgical History:  Procedure Laterality Date  . ENDOMETRIAL ABLATION  3/06     Medications: Current Meds  Medication Sig  . ALPRAZolam (XANAX) 0.5 MG tablet Take 0.5 mg by mouth 2 (two) times daily as needed.    Marland Kitchen aspirin 81 MG tablet Take 81 mg by mouth daily.  Marland Kitchen BOTOX 100 units SOLR injection INJECT AS DIRECTED AT PHYSICIANS OFFICE  . cetirizine (ZYRTEC) 10 MG tablet Take 10 mg by mouth daily.    . Cholecalciferol (VITAMIN D3) 1000 units CAPS Take 1,000 Units by mouth daily.  . clopidogrel (PLAVIX) 75 MG tablet Take 1 tablet (75 mg total) by mouth daily.  . DENTA 5000 PLUS 1.1 % CREA dental cream Place 1 application onto teeth at bedtime.   . diphenhydrAMINE (BENADRYL) 25 MG tablet Take 25 mg by mouth every 6 (six) hours as needed.    . DULoxetine (CYMBALTA) 60 MG capsule Take 1 capsule (60 mg total) by mouth daily.  Marland Kitchen FLUoxetine (PROZAC) 20 MG capsule Take 20 mg by mouth daily.    . fluticasone (FLONASE) 50 MCG/ACT nasal spray fluticasone 50 mcg/actuation nasal spray,suspension  . gabapentin (NEURONTIN) 600 MG tablet Take 600 mg by mouth 2 (two) times daily.   Marland Kitchen HYDROCORTISONE ACE,  RECTAL, 30 MG SUPP Place 1 suppository rectally 2 (two) times daily.   Marland Kitchen. levothyroxine (SYNTHROID, LEVOTHROID) 75 MCG tablet Take 75 mcg by mouth daily before breakfast.  . LINZESS 145 MCG CAPS capsule Take 145 mcg by mouth daily.   . nitroGLYCERIN (NITROSTAT) 0.4 MG SL tablet Place 1 tablet (0.4 mg total) under the tongue every 5 (five) minutes as needed.  Raylene Miyamoto. NUCYNTA ER 50 MG 12 hr tablet TAKE 1 TABLET TWO TIMES DAILY FOR 30 DAYS  . OnabotulinumtoxinA (BOTOX IJ) Inject 500 Units as directed every 3 (three) months.  . pantoprazole (PROTONIX) 40 MG tablet Take 40 mg by mouth as needed.   . polyethylene glycol powder (GLYCOLAX/MIRALAX) powder MIX 17GM IN 8 OZ. OF WATER DAILY OR TWICE DAILY FOR 30 DAYS  . ranolazine (RANEXA) 500 MG 12 hr tablet Take 1 tablet (500 mg total) by mouth as needed.  Marland Kitchen.  rOPINIRole (REQUIP) 0.5 MG tablet Take 0.5 mg by mouth Daily.  . simvastatin (ZOCOR) 40 MG tablet Take 40 mg by mouth at bedtime.    . tapentadol (NUCYNTA) 50 MG tablet Take 50 mg by mouth.     Allergies: Allergies  Allergen Reactions  . Aspirin     Gastric intolerance  . Morphine   . Penicillins   . Zolpidem Tartrate     Social History: The patient  reports that she has quit smoking. she has never used smokeless tobacco. She reports that she drinks alcohol. She reports that she does not use drugs.   Family History: The patient's family history includes Arrhythmia in her father.   Review of Systems: Please see the history of present illness.   Otherwise, the review of systems is positive for none.   All other systems are reviewed and negative.   Physical Exam: VS:  BP 110/80 (BP Location: Left Arm, Patient Position: Sitting, Cuff Size: Normal)   Pulse 86   Ht 5' 2.5" (1.588 m)   Wt 130 lb 6.4 oz (59.1 kg)   BMI 23.47 kg/m  .  BMI Body mass index is 23.47 kg/m.  Wt Readings from Last 3 Encounters:  08/31/17 130 lb 6.4 oz (59.1 kg)  07/14/17 136 lb 8 oz (61.9 kg)  04/14/17 132 lb 12 oz (60.2 kg)    General: Pleasant. Alert and in no acute distress. She has left sided weakness from prior stroke.  HEENT: Normal.  Neck: Supple, no JVD, carotid bruits, or masses noted.  Cardiac: Regular rate and rhythm. No murmurs, rubs, or gallops. No edema.  Respiratory:  Lungs are clear to auscultation bilaterally with normal work of breathing.  GI: Soft and nontender.  MS: No deformity or atrophy. Gait and ROM intact.  Skin: Warm and dry. Color is normal.  Neuro:  Strength and sensation are intact and no gross focal deficits noted.  Psych: Alert, appropriate and with normal affect.   LABORATORY DATA:  EKG:  EKG is ordered today. This demonstrates NSR - has always had lateral Q's.  Lab Results  Component Value Date   GLUCOSE 90 09/10/2009   CHOL 164 09/10/2009   TRIG 111.0  09/10/2009   HDL 55.70 09/10/2009   LDLCALC 86 09/10/2009   ALT 23 09/10/2009   AST 24 09/10/2009   NA 140 09/10/2009   K 4.0 09/10/2009   CL 108 09/10/2009   CREATININE 0.7 09/10/2009   BUN 13 09/10/2009   CO2 30 09/10/2009       BNP (last 3 results) No results for input(s):  BNP in the last 8760 hours.  ProBNP (last 3 results) No results for input(s): PROBNP in the last 8760 hours.   Other Studies Reviewed Today:  Myoview Study Result 08/2015   No diagnostic ST segment changes to indicate ischemia.  Small, mild intensity, lateral apical region of ischemia in the setting of artifact from extracardiac radiotracer uptake and also arms down imaging. No large ischemic territories.  This is a low risk study.  Nuclear stress EF: 74%.     Echo Study Conclusions from 2011 Left ventricle: The cavity size was normal. Systolic function was normal. The estimated ejection fraction was in the range of 55% to 60%. Wall motion was normal; there were no regional wall motion abnormalities.    CARDIAC CATHETERIZATION 2007  HEMODYNAMICS:  1. Left main coronary artery: The left main coronary artery is free of  significant disease.  2. Left anterior descending artery: The left anterior descending artery  gave rise to two diagonal branches and a septal perforator. The LAD was  quite tortuous in its mid portion and then had a 99% occlusion in its  distal portion with TIMI-I flow.  3. Circumflex artery: The circumflex artery gave rise to an atrial branch,  marginal branch, and two posterolateral branches. These vessels were  free of significant disease.  4. Right coronary artery: The right coronary artery is a moderate size  vessel which gave rise to a clonus branch, right ventricular branch,  posterior descending branch, and a posterolateral branch. This vessel  was free of significant disease.  LEFT VENTRICULOGRAM: The left ventriculogram was obtained in  the RAO  projection showed akinesis of the tip of the apex in the inferoapical  segment. The overall wall motion was good with an estimated ejection  fraction of 60%.  Following attempt at PCA there was persistent 99% stenosis in the distal LAD  with TIMI-I flow.  CONCLUSION:  1. Coronary artery disease, status post recent non-ST-elevation myocardial  infarction with 99% stenosis in the distal left anterior descending with  TIMI-I flow, no significant obstruction in the circumflex and right  coronary arteries, and inferoapical wall akinesis.  2. Unsuccessful percutaneous coronary intervention of the lesion in the  distal left anterior descending due to inability to cross with the wire.  DISPOSITION: The patient returned to the postanesthesia care unit for  further observation. I think her outlook should be good on medical therapy.  She has no significant residual coronary disease and overall LV function is  good.  ______________________________  Charlies Constable, M.D. [facility]  BB/MEDQ D: 08/25/2005 T: 08/25/2005 Job: [id number]   Assessment / Plan: 1. CAD - remote NSTEMI - with unsuccessful PCI to the distal LAD and no significant residual CAD - managed medically. She uses just prn Ranexa. No recent NTG use. Low risk Myoview from 2017 with normal EF. She continues do remarkably well. Medicines refilled today. Continue with her current regimen. Will be available as needed.  She wishes to continue to follow with me.   2. HLD - on statin - monitored by PCP - her labs from June noted.   3. Prior stroke - on chronic Plavix. She is limited by residual nerve pain.   4. 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    Current medicines are reviewed with the patient today.  The patient does not have concerns regarding medicines other than what has been noted above.  The following changes have been made:  See above.  Labs/ tests ordered today  include:    Orders Placed  This Encounter  Procedures  . EKG 12-Lead     Disposition:   FU with me in 1 year.   Patient is agreeable to this plan and will call if any problems develop in the interim.   SignedNorma Fredrickson, NP  08/31/2017 11:10 AM  [facility] [address] [place], [place]  [address] Phone: [phone] Fax: [phone]

## 2017-08-31 NOTE — Patient Instructions (Addendum)
We will be checking the following labs today - NONE   Medication Instructions:    Continue with your current medicines.   I sent in your refills today    Testing/Procedures To Be Arranged:  N/A  Follow-Up:   See me in one year.     Other Special Instructions:   N/A    If you need a refill on your cardiac medications before your next appointment, please call your pharmacy.   Call the [facility] office at [phone] if you have any questions, problems or concerns.

## 2017-09-08 DIAGNOSIS — S5290XD Unspecified fracture of unspecified forearm, subsequent encounter for closed fracture with routine healing: Secondary | ICD-10-CM | POA: Insufficient documentation

## 2017-09-08 DIAGNOSIS — S62353D Nondisplaced fracture of shaft of third metacarpal bone, left hand, subsequent encounter for fracture with routine healing: Secondary | ICD-10-CM | POA: Diagnosis not present

## 2017-09-13 DIAGNOSIS — M542 Cervicalgia: Secondary | ICD-10-CM | POA: Diagnosis not present

## 2017-09-13 DIAGNOSIS — G894 Chronic pain syndrome: Secondary | ICD-10-CM | POA: Diagnosis not present

## 2017-09-13 DIAGNOSIS — G890 Central pain syndrome: Secondary | ICD-10-CM | POA: Diagnosis not present

## 2017-09-13 DIAGNOSIS — Z79899 Other long term (current) drug therapy: Secondary | ICD-10-CM | POA: Diagnosis not present

## 2017-09-23 DIAGNOSIS — S62353D Nondisplaced fracture of shaft of third metacarpal bone, left hand, subsequent encounter for fracture with routine healing: Secondary | ICD-10-CM | POA: Diagnosis not present

## 2017-09-27 DIAGNOSIS — M818 Other osteoporosis without current pathological fracture: Secondary | ICD-10-CM | POA: Diagnosis not present

## 2017-09-27 DIAGNOSIS — M810 Age-related osteoporosis without current pathological fracture: Secondary | ICD-10-CM | POA: Diagnosis not present

## 2017-09-27 DIAGNOSIS — M8589 Other specified disorders of bone density and structure, multiple sites: Secondary | ICD-10-CM | POA: Diagnosis not present

## 2017-10-05 ENCOUNTER — Ambulatory Visit: Payer: Medicare HMO | Admitting: Gastroenterology

## 2017-10-06 DIAGNOSIS — M810 Age-related osteoporosis without current pathological fracture: Secondary | ICD-10-CM | POA: Insufficient documentation

## 2017-10-06 DIAGNOSIS — Z6823 Body mass index (BMI) 23.0-23.9, adult: Secondary | ICD-10-CM | POA: Diagnosis not present

## 2017-10-06 DIAGNOSIS — S62353D Nondisplaced fracture of shaft of third metacarpal bone, left hand, subsequent encounter for fracture with routine healing: Secondary | ICD-10-CM | POA: Diagnosis not present

## 2017-10-07 ENCOUNTER — Other Ambulatory Visit: Payer: Self-pay | Admitting: Neurology

## 2017-10-13 ENCOUNTER — Encounter: Payer: Self-pay | Admitting: Neurology

## 2017-10-13 ENCOUNTER — Encounter (INDEPENDENT_AMBULATORY_CARE_PROVIDER_SITE_OTHER): Payer: Self-pay

## 2017-10-13 ENCOUNTER — Ambulatory Visit: Payer: Medicare HMO | Admitting: Neurology

## 2017-10-13 ENCOUNTER — Telehealth: Payer: Self-pay | Admitting: Neurology

## 2017-10-13 VITALS — BP 123/81 | HR 95 | Ht 62.5 in | Wt 132.0 lb

## 2017-10-13 DIAGNOSIS — G890 Central pain syndrome: Secondary | ICD-10-CM | POA: Diagnosis not present

## 2017-10-13 DIAGNOSIS — G894 Chronic pain syndrome: Secondary | ICD-10-CM | POA: Diagnosis not present

## 2017-10-13 DIAGNOSIS — I69354 Hemiplegia and hemiparesis following cerebral infarction affecting left non-dominant side: Secondary | ICD-10-CM

## 2017-10-13 DIAGNOSIS — M542 Cervicalgia: Secondary | ICD-10-CM | POA: Diagnosis not present

## 2017-10-13 NOTE — Progress Notes (Addendum)
[facility]  CC:  Spastic hemiplegia  [name] is a 67  y.o. Right-handed Caucasian female, return for EMG guided Botox injection for her left spastic hemiparesis,  She suffered stroke in 1999, with residual left spastic hemiparesis, upper extremity more than leg, left visual field cut  She previously was enrolled in Botox research study for spastic upper extremity at [facility] by Dr. Marya Fossa, received EMG guided Botox injection in 2010 for about 2 years, with good benefit, but has difficulty with insurance  She began to receive Botox injection through our clinic by Dr. Hosie Poisson since May 2015,150 units total to left upper extremity, which has been very helpful  She is return for continued EMG guided Botox injection, at baseline, she can ambulate with a left AFO, but without assistance,  Profound left upper extremity spasticity, achiness, chronic pain, persistent left wrist flexion, finger flexion,  She also has a history of coronary artery disease in 2007, taking Plavix, and Aggrenox, exercise regularly, has a personal trainer  UPDATE March 2nd 2016: She received 300 units in Jun 13 2014, to left upper extremity, which has been very helpful, she did not notice significant side effect. The injection help her open her left hand better, no significant side effect noticed.  UPDATE June 8th 2016: She received 300 units of Botox A in March second 2016, responded very well, she was able to relax her left hand better, she ambulate without assistance with spastic left hemiparesis, complains of left facial, left foot pain, wants the injection to emphasize on her left finger flexion wrist flexion today.  UPDATE Sep 28th 2016: She responded to previous Botox injection very well in June, she wants to emphasize on her left upper extremity at this time, complains forceful finger contraction, flexion, elbow flexion. Only mild left shoulder pain with passive  movement.  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 Patient is concerned about potential side effect of weakness.  Update May 21 2016: She responded very well to previous injection in July, only wants to receive injection for spastic left upper extremity, she had a pair of left wrist/finger splint,  Update September 09 2016: She is now using a left wrist splint, which has been helpful,  UPDATE Dec 10 2016: She responded well to previous injection no significant side effect noticed,   UPDATE Apr 14 2017: She wear left wrist pain, denies significant side effect, complains of left-sided neuropathic pain, despite Prozac 20 mg, Nucynta 50 mg, gabapentin 600 mg twice a day,  UPDATE Jul 14 2017: She responded very well to previous injection in September 2018,  UPDATE October 13 2017: She responded very well to previous Botox injection  Social History   Socioeconomic History  . Marital status: Married    Spouse name: Not on file  . Number of children: Not on file  . Years of education: Not on file  . Highest education level: Not on file  Social Needs  . Financial resource strain: Not on file  . Food insecurity - worry: Not on file  . Food insecurity - inability: Not on file  . Transportation needs - medical: Not on file  . Transportation needs - non-medical: Not on  file  Occupational History  . Occupation: disabled  Tobacco Use  . Smoking status: Former Games developer  . Smokeless tobacco: Never Used  . Tobacco comment: Quit in 2005  Substance and Sexual Activity  . Alcohol use: Yes    Comment: social  . Drug use: No  . Sexual activity: Not Currently  Other Topics Concern  . Not on file  Social History Narrative   Married, no children   Right handed   12th grade   1 cup daily    Family History  Problem Relation Age of Onset  . Arrhythmia Father        PPM    Past Medical History:  Diagnosis Date  . AMI (acute myocardial infarction) (HCC) 2007   failed PCI to the distal LAD - managed medically  . CVA (cerebral vascular accident) (HCC) 1999  . Degenerative disc disease, cervical   . Hypothyroidism   . Unstable angina [facility])     Past Surgical History:  Procedure Laterality Date  . ENDOMETRIAL ABLATION  3/06    Current Outpatient Medications  Medication Sig Dispense Refill  . ALPRAZolam (XANAX) 0.5 MG tablet Take 0.5 mg by mouth 2 (two) times daily as needed.      Marland Kitchen aspirin 81 MG tablet Take 81 mg by mouth daily.    Marland Kitchen BOTOX 100 units SOLR injection INJECT AS DIRECTED AT PHYSICIANS OFFICE (Patient taking differently: INJECT AS DIRECTED AT PHYSICIANS OFFICE (500 units)) 5 vial 2  . cetirizine (ZYRTEC) 10 MG tablet Take 10 mg by mouth daily.      . Cholecalciferol (VITAMIN D3) 1000 units CAPS Take 1,000 Units by mouth daily.    . clopidogrel (PLAVIX) 75 MG tablet Take 1 tablet (75 mg total) by mouth daily. 90 tablet 3  . DENTA 5000 PLUS 1.1 % CREA dental cream Place 1 application onto teeth at bedtime.   4  . diphenhydrAMINE (BENADRYL) 25 MG tablet Take 25 mg by mouth every 6 (six) hours as needed.      . DULoxetine (CYMBALTA) 60 MG capsule Take 1 capsule (60 mg total) by mouth daily. 30 capsule 11  . FLUoxetine (PROZAC) 20 MG capsule Take 20 mg by mouth daily.      . fluticasone (FLONASE) 50 MCG/ACT nasal spray fluticasone 50  mcg/actuation nasal spray,suspension    . gabapentin (NEURONTIN) 600 MG tablet Take 600 mg by mouth 2 (two) times daily.     Marland Kitchen HYDROCORTISONE ACE, RECTAL, 30 MG SUPP Place 1 suppository rectally 2 (two) times daily.     Marland Kitchen levothyroxine (SYNTHROID, LEVOTHROID) 75 MCG tablet Take 75 mcg by mouth daily before breakfast.    . LINZESS 145 MCG CAPS capsule Take 145 mcg by mouth daily.     . nitroGLYCERIN (NITROSTAT) 0.4 MG SL tablet Place 1 tablet (0.4 mg total) under the tongue every 5 (five) minutes as needed. 25 tablet 3  . NUCYNTA ER 50 MG 12 hr tablet TAKE 1 TABLET TWO TIMES DAILY FOR 30 DAYS  0  . OnabotulinumtoxinA (BOTOX IJ) Inject 500 Units as directed every 3 (three) months.    . pantoprazole (PROTONIX) 40 MG tablet Take 40 mg by mouth as needed.     . polyethylene glycol  powder (GLYCOLAX/MIRALAX) powder MIX 17GM IN 8 OZ. OF WATER DAILY OR TWICE DAILY FOR 30 DAYS  6  . ranolazine (RANEXA) 500 MG 12 hr tablet Take 1 tablet (500 mg total) by mouth as needed. 60 tablet 11  . rOPINIRole (REQUIP) 0.5 MG tablet Take 0.5 mg by mouth Daily.    . simvastatin (ZOCOR) 40 MG tablet Take 40 mg by mouth at bedtime.      . tapentadol (NUCYNTA) 50 MG tablet Take 50 mg by mouth.     No current facility-administered medications for this visit.     Allergies as of 10/13/2017 - Review Complete 10/13/2017  Allergen Reaction Noted  . Aspirin  08/18/2012  . Morphine  01/01/2009  . Penicillins  01/01/2009  . Zolpidem tartrate  01/01/2009    Vitals: BP 123/81   Pulse 95   Ht 5' 2.5" (1.588 m)   Wt 132 lb (59.9 kg)   BMI 23.76 kg/m  Last Weight:  Wt Readings from Last 1 Encounters:  10/13/17 132 lb (59.9 kg)   Last Height:   Ht Readings from Last 1 Encounters:  10/13/17 5' 2.5" (1.588 m)   PHYSICAL EXAMINATOINS:  Generalized: In no acute distress  Neck: Supple, no carotid bruits   Cardiac: Regular rate rhythm  Pulmonary: Clear to auscultation bilaterally  Musculoskeletal: No  deformity  Neurological examination  Mentation: Alert oriented to time, place, history taking, and causual conversation, left lower face weakness  Cranial nerve II-XII: Pupils were equal round reactive to light extraocular movements were full, left visual field cut.   Left lower face weakness. hearing was intact to finger rubbing bilaterally. Uvula tongue midline.  head turning and shoulder shrug and were normal and symmetric.Tongue protrusion into cheek strength was normal.  Motor: spastic left hemiparesis,  Wearing left AFO, left lower extremity moderate spasticity, left hip flexion 4, knee flexion 4, extension 5, Profound left upper extremity spasticity, left shoulder adduction, internal rotation.left elbow flexion, pronation, with passive movement, mild fixed contraction of left elbow, maximum 175, left wrist forceful flexion, finger flexion,thumb in position, even with forceful movement, she could not achieve full extension of her left finger  Gait: Rising up from seated position by pushing on chair arm,spastic left hemi-circumferential gait.  Deep tendon reflexes: hyperreflexia of left side   Assessment and plan:   67 years old right-handed Caucasian female, status post stroke, with spastic left hemiparesis since 1999, responded very well to previous EMG guided Botox injection, return to clinic for repeat injection  Under electric stimulation, 500 units of Botox A was injected into left upper extremity muscles  Left pronator teres 25 x2 =50 units Left flexor carpi radialis 50 units Left  flexor carpi ulnaris 25 x2=50units Left flexor digitorum profundus 25x2=50 units  Left palmaris longus 25 x2=50 units  Left brachialis 50 units Left pectoralis major 25x2=50 units Left teres major 50 units Left latissimus dorsi 25 units  Left tibialis posterior 50 units Left flexor digitorum longus 50 units     Levert FeinsteinYijun Khalani Novoa, M.D. Ph.D.  [facility] [address] [place], [place] [address] Phone: [phone] Fax:      [phone]

## 2017-10-13 NOTE — Telephone Encounter (Signed)
Pt. Needs 12 wk Botox inj  °

## 2017-10-13 NOTE — Progress Notes (Signed)
**  Botox 100 units x 5 vials, NDC [id number], Lot [id number], Exp 03/2020, specialty pharmacy.//mck,rn**

## 2017-10-14 NOTE — Telephone Encounter (Signed)
I called to schedule the patient for her next injection but when she answered she said that she was on her way to the beach and would like to call me back at a later time to schedule.

## 2017-10-19 DIAGNOSIS — Z6823 Body mass index (BMI) 23.0-23.9, adult: Secondary | ICD-10-CM | POA: Diagnosis not present

## 2017-10-19 DIAGNOSIS — M810 Age-related osteoporosis without current pathological fracture: Secondary | ICD-10-CM | POA: Diagnosis not present

## 2017-10-20 DIAGNOSIS — M79672 Pain in left foot: Secondary | ICD-10-CM | POA: Diagnosis not present

## 2017-10-20 DIAGNOSIS — L89892 Pressure ulcer of other site, stage 2: Secondary | ICD-10-CM | POA: Diagnosis not present

## 2017-10-27 ENCOUNTER — Other Ambulatory Visit: Payer: Self-pay | Admitting: Internal Medicine

## 2017-10-27 DIAGNOSIS — Z1231 Encounter for screening mammogram for malignant neoplasm of breast: Secondary | ICD-10-CM

## 2017-11-10 DIAGNOSIS — G894 Chronic pain syndrome: Secondary | ICD-10-CM | POA: Diagnosis not present

## 2017-11-10 DIAGNOSIS — M542 Cervicalgia: Secondary | ICD-10-CM | POA: Diagnosis not present

## 2017-11-10 DIAGNOSIS — G890 Central pain syndrome: Secondary | ICD-10-CM | POA: Diagnosis not present

## 2017-11-10 DIAGNOSIS — Z79899 Other long term (current) drug therapy: Secondary | ICD-10-CM | POA: Diagnosis not present

## 2017-11-10 DIAGNOSIS — Z79891 Long term (current) use of opiate analgesic: Secondary | ICD-10-CM | POA: Diagnosis not present

## 2017-11-17 ENCOUNTER — Encounter: Payer: Self-pay | Admitting: Gastroenterology

## 2017-11-17 ENCOUNTER — Ambulatory Visit: Payer: Medicare HMO | Admitting: Gastroenterology

## 2017-11-17 VITALS — BP 122/87 | HR 86 | Temp 97.0°F | Ht 61.0 in | Wt 131.2 lb

## 2017-11-17 DIAGNOSIS — K909 Intestinal malabsorption, unspecified: Secondary | ICD-10-CM

## 2017-11-17 DIAGNOSIS — T402X5A Adverse effect of other opioids, initial encounter: Secondary | ICD-10-CM | POA: Diagnosis not present

## 2017-11-17 DIAGNOSIS — K625 Hemorrhage of anus and rectum: Secondary | ICD-10-CM | POA: Diagnosis not present

## 2017-11-17 DIAGNOSIS — K5903 Drug induced constipation: Secondary | ICD-10-CM | POA: Diagnosis not present

## 2017-11-17 MED ORDER — NALOXEGOL OXALATE 25 MG PO TABS: 25.0000 mg | ORAL_TABLET | Freq: Every day | ORAL | 0 refills | Status: DC

## 2017-11-17 NOTE — Patient Instructions (Signed)
1. Trial of Movantik 25mg  daily one hour before food or two hours after food for constipation.  2. Stop Miralax after second day of Movantik. Stop Linzess.  3. Please have your labs done. We will contact you with next step once results reviewed.

## 2017-11-17 NOTE — Addendum Note (Signed)
Addended by: Tiffany KocherLEWIS, Jabir Dahlem S on: 11/17/2017 03:26 PM   Modules accepted: Orders

## 2017-11-17 NOTE — Progress Notes (Signed)
Primary Care Physician:  Selinda Flavin, MD  Primary Gastroenterologist:  Jonette Eva, MD   Chief Complaint  Patient presents with  . Hemorrhoids    HPI:  [name] is a 67 y.o. female here at the request of Dr. Dimas Aguas for further evaluation of hemorrhoids. Request from 08/2017. Since then she has developed some bowel concerns. She states she saw Dr. Erskine Speed for her hemorrhoids back in 08/2017. Reviewed OV Note on Care Everywhere, few small hemorrhoidal tags noted, no thrombosed hemorrhoids, no surgical intervention needed. He advised repeat TCS. Patient has not had that done. Last TCS at age 20 was "normal" per patient. No FH CRC.   She typically is chronically constipated. Had some medication as last fall, was started on Cymbalta back around August to control post stroke pain.  She was already on Linzess.  Cymbalta was added she felt like her stools were loose and uncontrollable.  Tried stopping Cymbalta but it really helped her pain therefore she resumed it.  A couple months ago she noted that her stools were greasy/fatty.  Difficult to flush.  Malodorous.  Appeared to have oil droplets.  Typically having a bowel movement every couple of days.  Taking MiraLAX once every day, utilizing Linzess only after having no bowel movement for a couple of days at a time.  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  Intermittent bright red blood per rectum which she feels is related to hemorrhoids.  Times utilizes fleets suppository.  Uses hemorrhoid cream, suppository, sit baths as needed.  No unintentional weight loss.  Lifetime weight watchers member since 2011.   Interestingly patient states that she tolerates fatty meals without any significant diarrhea.  Fatty milk consumption is somewhat limited however.  She has never been on Movantik, Relistor.  Only  constipation regimens have included Linzess, lactulose, MiraLAX.   Current Outpatient Medications  Medication Sig Dispense Refill  . ALPRAZolam (XANAX) 0.5 MG tablet Take 0.5 mg by mouth 2 (two) times daily as needed.      Marland Kitchen aspirin 81 MG tablet Take 81 mg by mouth daily.    Marland Kitchen BOTOX 100 units SOLR injection INJECT AS DIRECTED AT PHYSICIANS OFFICE (Patient taking differently: INJECT AS DIRECTED AT PHYSICIANS OFFICE (500 units)) 5 vial 2  . cetirizine (ZYRTEC) 10 MG tablet Take 10 mg by mouth daily.      . Cholecalciferol (VITAMIN D3) 1000 units CAPS Take 1,000 Units by mouth daily.    . clopidogrel (PLAVIX) 75 MG tablet Take 1 tablet (75 mg total) by mouth daily. 90 tablet 3  . DENTA 5000 PLUS 1.1 % CREA dental cream Place 1 application onto teeth at bedtime.   4  . diphenhydrAMINE (BENADRYL) 25 MG tablet Take 25 mg by mouth every 6 (six) hours as needed.      . DULoxetine (CYMBALTA) 60 MG capsule Take 1 capsule (60 mg total) by mouth daily. 30 capsule 11  . FLUoxetine (PROZAC) 20 MG capsule Take 20 mg by mouth daily.      . fluticasone (FLONASE) 50 MCG/ACT nasal spray fluticasone 50 mcg/actuation nasal spray,suspension    . gabapentin (NEURONTIN) 600 MG tablet Take 600 mg by mouth 2 (two) times daily.     Marland Kitchen HYDROCORTISONE ACE, RECTAL, 30 MG SUPP Place 1 suppository rectally as needed.     Marland Kitchen levothyroxine (SYNTHROID, LEVOTHROID) 75 MCG tablet  Take 75 mcg by mouth daily before breakfast.    . LINZESS 145 MCG CAPS capsule Take 145 mcg by mouth as needed.     . nitroGLYCERIN (NITROSTAT) 0.4 MG SL tablet Place 1 tablet (0.4 mg total) under the tongue every 5 (five) minutes as needed. 25 tablet 3  . NUCYNTA ER 50 MG 12 hr tablet TAKE 1 TABLET TWO TIMES DAILY FOR 30 DAYS  0  . pantoprazole (PROTONIX) 40 MG tablet Take 40 mg by mouth as needed.     . polyethylene glycol powder (GLYCOLAX/MIRALAX) powder MIX 17GM IN 8 OZ. OF WATER DAILY  6  . ranolazine (RANEXA) 500 MG 12 hr tablet Take 1 tablet (500  mg total) by mouth as needed. 60 tablet 11  . rOPINIRole (REQUIP) 0.5 MG tablet Take 2 mg by mouth Daily.     . simvastatin (ZOCOR) 40 MG tablet Take 40 mg by mouth at bedtime.      . tapentadol (NUCYNTA) 50 MG tablet Take 50 mg by mouth.     No current facility-administered medications for this visit.     Allergies as of 11/17/2017 - Review Complete 11/17/2017  Allergen Reaction Noted  . Morphine  01/01/2009  . Penicillins  01/01/2009  . Zolpidem tartrate  01/01/2009    Past Medical History:  Diagnosis Date  . AMI (acute myocardial infarction) (HCC) 2007   failed PCI to the distal LAD - managed medically  . Chronic pain   . CVA (cerebral vascular accident) (HCC) 1999  . Degenerative disc disease, cervical   . Hypothyroidism   . Unstable angina [facility])     Past Surgical History:  Procedure Laterality Date  . ENDOMETRIAL ABLATION  3/06    Family History  Problem Relation Age of Onset  . Arrhythmia Father        PPM  . Colon cancer Neg Hx     Social History   Socioeconomic History  . Marital status: Married    Spouse name: Not on file  . Number of children: Not on file  . Years of education: Not on file  . Highest education level: Not on file  Occupational History  . Occupation: disabled  Social Needs  . Financial resource strain: Not on file  . Food insecurity:    Worry: Not on file    Inability: Not on file  . Transportation needs:    Medical: Not on file    Non-medical: Not on file  Tobacco Use  . Smoking status: Former Games developer  . Smokeless tobacco: Never Used  . Tobacco comment: Quit in 2005  Substance and Sexual Activity  . Alcohol use: Yes    Comment: social  . Drug use: No  . Sexual activity: Not Currently  Lifestyle  . Physical activity:    Days per week: Not on file    Minutes per session: Not on file  . Stress: Not on file  Relationships  . Social connections:    Talks on phone: Not on file    Gets together: Not on file    Attends  religious service: Not on file    Active member of club or organization: Not on file    Attends meetings of clubs or organizations: Not on file    Relationship status: Not on file  . Intimate partner violence:    Fear of current or ex partner: Not on file    Emotionally abused: Not on file    Physically abused: Not on file  Forced sexual activity: Not on file  Other Topics Concern  . Not on file  Social History Narrative   Married, no children   Right handed   12th grade   1 cup daily      ROS:  General: Negative for anorexia, weight loss, fever, chills, fatigue, positive for left-sided chronic weakness. Eyes: Negative for vision changes.  ENT: Negative for hoarseness, difficulty swallowing , nasal congestion. CV: Negative for chest pain, angina, palpitations, dyspnea on exertion, peripheral edema.  Respiratory: Negative for dyspnea at rest, dyspnea on exertion, cough, sputum, wheezing.  GI: See history of present illness. GU:  Negative for dysuria, hematuria, urinary incontinence, urinary frequency, nocturnal urination.  MS: Chronic pain predominantly left-sided post stroke  Derm: Negative for rash or itching.  Neuro: Negative for weakness, abnormal sensation, seizure, frequent headaches, memory loss, confusion.  Psych: Negative for anxiety, depression, suicidal ideation, hallucinations.  Endo: Negative for unusual weight change.  Heme: Negative for bruising or bleeding. Allergy: Negative for rash or hives.    Physical Examination:  BP 122/87   Pulse 86   Temp (!) 97 F (36.1 C) (Oral)   Ht 5\' 1"  (1.549 m)   Wt 131 lb 3.2 oz (59.5 kg)   BMI 24.79 kg/m    General: Well-nourished, well-developed in no acute distress.  Accompanied by spouse.  Left-sided upper and lower extremity weakness noted with ambulation. Head: Normocephalic, atraumatic.   Eyes: Conjunctiva pink, no icterus. Mouth: Oropharyngeal mucosa moist and pink , no lesions erythema or exudate. Neck:  Supple without thyromegaly, masses, or lymphadenopathy.  Lungs: Clear to auscultation bilaterally.  Heart: Regular rate and rhythm, no murmurs rubs or gallops.  Abdomen: Bowel sounds are normal, nontender, nondistended, no hepatosplenomegaly or masses, no abdominal bruits or hernia , no rebound or guarding.   Rectal: Deferred Extremities: No lower extremity edema. Neuro: Alert and oriented x 4 , left-sided weakness with use of left upper extremity, ambulates with a limp Skin: Warm and dry, no rash or jaundice.   Psych: Alert and cooperative, normal mood and affect.  Imaging Studies: No results found.  Impression/plan:  Very pleasant 67 year old female with history of stroke back in the 1990s with residual left sided upper and lower extremity weakness, chronic pain on opioid therapy who presents for further evaluation of hemorrhoids.  Patient is most interested in evaluation of change in bowel habits.  Hemorrhoids are fairly asymptomatic at this point.  She complains of chronic constipation which is likely opioid induced.  She had some change with management with management after Cymbalta was introduced.  Became less tolerant to Linzess, having more urgency and watery stools and then had more episodes of fecal incontinence in the setting of limited mobility.  Over the past 2 months describes having fatty stools.  No unintentional weight loss.  At this time will obtain baseline labs, screen for celiac.  Ultimately she will need a colonoscopy but await labs to determine if she will need upper endoscopy as well.  Plan on deep sedation given polypharmacy.   We will start patient on Movantik 25 mg daily.  Samples provided as well as rebate card and prescription.  She will stop Linzess.  After day 2 of Movantik she will stop MiraLAX.

## 2017-11-18 NOTE — Progress Notes (Signed)
cc'd to pcp 

## 2017-11-22 LAB — TISSUE TRANSGLUTAMINASE, IGA

## 2017-11-22 LAB — COMPREHENSIVE METABOLIC PANEL
ALBUMIN: 4.4 g/dL (ref 3.6–4.8)
ALT: 13 IU/L (ref 0–32)
AST: 17 IU/L (ref 0–40)
Albumin/Globulin Ratio: 1.8 (ref 1.2–2.2)
Alkaline Phosphatase: 60 IU/L (ref 39–117)
BUN/Creatinine Ratio: 23 (ref 12–28)
BUN: 16 mg/dL (ref 8–27)
Bilirubin Total: <0.2 mg/dL (ref 0.0–1.2)
CALCIUM: 9.3 mg/dL (ref 8.7–10.3)
CO2: 26 mmol/L (ref 20–29)
CREATININE: 0.7 mg/dL (ref 0.57–1.00)
Chloride: 104 mmol/L (ref 96–106)
GFR calc Af Amer: 105 mL/min/{1.73_m2} (ref >59)
GFR, EST NON AFRICAN AMERICAN: 91 mL/min/{1.73_m2} (ref >59)
Globulin, Total: 2.5 g/dL (ref 1.5–4.5)
Glucose: 104 mg/dL — ABNORMAL HIGH (ref 65–99)
Potassium: 4.6 mmol/L (ref 3.5–5.2)
SODIUM: 143 mmol/L (ref 134–144)
TOTAL PROTEIN: 6.9 g/dL (ref 6.0–8.5)

## 2017-11-22 LAB — CBC WITH DIFFERENTIAL/PLATELET
BASOS: 0 %
Basophils Absolute: 0 10*3/uL (ref 0.0–0.2)
EOS (ABSOLUTE): 0.1 10*3/uL (ref 0.0–0.4)
EOS: 1 %
HEMATOCRIT: 41 % (ref 34.0–46.6)
Hemoglobin: 13.9 g/dL (ref 11.1–15.9)
IMMATURE GRANULOCYTES: 0 %
Immature Grans (Abs): 0 10*3/uL (ref 0.0–0.1)
Lymphocytes Absolute: 1.8 10*3/uL (ref 0.7–3.1)
Lymphs: 24 %
MCH: 32.6 pg (ref 26.6–33.0)
MCHC: 33.9 g/dL (ref 31.5–35.7)
MCV: 96 fL (ref 79–97)
MONOS ABS: 0.5 10*3/uL (ref 0.1–0.9)
Monocytes: 6 %
NEUTROS PCT: 69 %
Neutrophils Absolute: 5.2 10*3/uL (ref 1.4–7.0)
Platelets: 307 10*3/uL (ref 150–379)
RBC: 4.27 x10E6/uL (ref 3.77–5.28)
RDW: 13 % (ref 12.3–15.4)
WBC: 7.7 10*3/uL (ref 3.4–10.8)

## 2017-11-22 LAB — LIPASE: Lipase: 37 U/L (ref 14–72)

## 2017-11-22 LAB — IGA: IgA/Immunoglobulin A, Serum: 246 mg/dL (ref 87–352)

## 2017-11-22 LAB — TSH: TSH: 0.94 u[IU]/mL (ref 0.450–4.500)

## 2017-11-30 ENCOUNTER — Telehealth: Payer: Self-pay | Admitting: Gastroenterology

## 2017-11-30 MED ORDER — HYDROCORTISONE 2.5 % RE CREA: 1.0000 "application " | TOPICAL_CREAM | Freq: Two times a day (BID) | RECTAL | 0 refills | Status: DC

## 2017-11-30 NOTE — Telephone Encounter (Signed)
Pt said she had labs done on 4/17 and was calling to see if the results were available. She also asked if something could be called into [facility] for her hemorrhoids. Please advise and call [phone]

## 2017-11-30 NOTE — Telephone Encounter (Signed)
Noted and pt aware 

## 2017-11-30 NOTE — Addendum Note (Signed)
Addended by: Tiffany Kocher on: 11/30/2017 01:01 PM   Modules accepted: Orders

## 2017-11-30 NOTE — Telephone Encounter (Signed)
PT saw Tana Coast, PA on 11/17/2017. Forwarding to her.

## 2017-11-30 NOTE — Telephone Encounter (Signed)
See result note.  

## 2017-11-30 NOTE — Telephone Encounter (Signed)
Another busy signal.

## 2017-11-30 NOTE — Progress Notes (Signed)
PT is aware. OK to schedule colonoscopy, but speak with her first, her husband is going out of town somtime and she needs to plan around that date. She has to check it out.  She is aware Rx sent to the pharmacy for hemorrhoids.

## 2017-11-30 NOTE — Telephone Encounter (Signed)
Tried to call pt to get more info about her hemorrhoids. Busy signal.

## 2017-12-02 ENCOUNTER — Other Ambulatory Visit: Payer: Self-pay

## 2017-12-02 DIAGNOSIS — E039 Hypothyroidism, unspecified: Secondary | ICD-10-CM | POA: Diagnosis not present

## 2017-12-02 DIAGNOSIS — E785 Hyperlipidemia, unspecified: Secondary | ICD-10-CM | POA: Diagnosis not present

## 2017-12-02 DIAGNOSIS — R194 Change in bowel habit: Secondary | ICD-10-CM

## 2017-12-02 DIAGNOSIS — G629 Polyneuropathy, unspecified: Secondary | ICD-10-CM | POA: Diagnosis not present

## 2017-12-02 DIAGNOSIS — R69 Illness, unspecified: Secondary | ICD-10-CM | POA: Diagnosis not present

## 2017-12-02 DIAGNOSIS — I69354 Hemiplegia and hemiparesis following cerebral infarction affecting left non-dominant side: Secondary | ICD-10-CM | POA: Diagnosis not present

## 2017-12-02 DIAGNOSIS — G2581 Restless legs syndrome: Secondary | ICD-10-CM | POA: Diagnosis not present

## 2017-12-02 DIAGNOSIS — G4700 Insomnia, unspecified: Secondary | ICD-10-CM | POA: Diagnosis not present

## 2017-12-02 DIAGNOSIS — K625 Hemorrhage of anus and rectum: Secondary | ICD-10-CM

## 2017-12-02 DIAGNOSIS — G8929 Other chronic pain: Secondary | ICD-10-CM | POA: Diagnosis not present

## 2017-12-02 DIAGNOSIS — I25119 Atherosclerotic heart disease of native coronary artery with unspecified angina pectoris: Secondary | ICD-10-CM | POA: Diagnosis not present

## 2017-12-02 MED ORDER — PEG 3350-KCL-NA BICARB-NACL 420 G PO SOLR: 4000.0000 mL | ORAL | 0 refills | Status: DC

## 2017-12-03 ENCOUNTER — Ambulatory Visit
Admission: RE | Admit: 2017-12-03 | Discharge: 2017-12-03 | Disposition: A | Discharge status: Home / Self Care | Payer: Medicare HMO | Source: Ambulatory Visit | Attending: Internal Medicine | Admitting: Internal Medicine

## 2017-12-03 ENCOUNTER — Other Ambulatory Visit: Payer: Self-pay | Admitting: Internal Medicine

## 2017-12-03 DIAGNOSIS — Z1231 Encounter for screening mammogram for malignant neoplasm of breast: Secondary | ICD-10-CM

## 2017-12-07 DIAGNOSIS — M542 Cervicalgia: Secondary | ICD-10-CM | POA: Diagnosis not present

## 2017-12-07 DIAGNOSIS — G894 Chronic pain syndrome: Secondary | ICD-10-CM | POA: Diagnosis not present

## 2017-12-07 DIAGNOSIS — G890 Central pain syndrome: Secondary | ICD-10-CM | POA: Diagnosis not present

## 2017-12-09 ENCOUNTER — Telehealth: Payer: Self-pay | Admitting: Neurology

## 2017-12-09 NOTE — Telephone Encounter (Signed)
Tresa Endo with [employer] calling to schedule delivery for the pt. Tresa Endo can be reached at [phone] option 9,4 then 2

## 2017-12-14 NOTE — Telephone Encounter (Signed)
I called and scheduled the delivery for 05/16.

## 2018-01-04 DIAGNOSIS — G894 Chronic pain syndrome: Secondary | ICD-10-CM | POA: Diagnosis not present

## 2018-01-04 DIAGNOSIS — G890 Central pain syndrome: Secondary | ICD-10-CM | POA: Diagnosis not present

## 2018-01-04 DIAGNOSIS — M542 Cervicalgia: Secondary | ICD-10-CM | POA: Diagnosis not present

## 2018-01-17 NOTE — Telephone Encounter (Signed)
Noted, thank you

## 2018-01-17 NOTE — Telephone Encounter (Signed)
Pt called, b[facility] 6[facility]26  [facility]

## 2018-01-19 DIAGNOSIS — R69 Illness, unspecified: Secondary | ICD-10-CM | POA: Diagnosis not present

## 2018-01-19 DIAGNOSIS — E039 Hypothyroidism, unspecified: Secondary | ICD-10-CM | POA: Diagnosis not present

## 2018-01-19 DIAGNOSIS — E7800 Pure hypercholesterolemia, unspecified: Secondary | ICD-10-CM | POA: Diagnosis not present

## 2018-01-25 DIAGNOSIS — Z6823 Body mass index (BMI) 23.0-23.9, adult: Secondary | ICD-10-CM | POA: Diagnosis not present

## 2018-01-25 DIAGNOSIS — E7800 Pure hypercholesterolemia, unspecified: Secondary | ICD-10-CM | POA: Diagnosis not present

## 2018-01-25 DIAGNOSIS — Z23 Encounter for immunization: Secondary | ICD-10-CM | POA: Diagnosis not present

## 2018-01-25 DIAGNOSIS — I639 Cerebral infarction, unspecified: Secondary | ICD-10-CM | POA: Diagnosis not present

## 2018-01-25 DIAGNOSIS — G2581 Restless legs syndrome: Secondary | ICD-10-CM | POA: Diagnosis not present

## 2018-01-25 DIAGNOSIS — Z681 Body mass index (BMI) 19 or less, adult: Secondary | ICD-10-CM | POA: Insufficient documentation

## 2018-01-25 DIAGNOSIS — E039 Hypothyroidism, unspecified: Secondary | ICD-10-CM | POA: Diagnosis not present

## 2018-01-26 ENCOUNTER — Telehealth: Payer: Self-pay | Admitting: Neurology

## 2018-01-26 ENCOUNTER — Encounter: Payer: Self-pay | Admitting: Neurology

## 2018-01-26 ENCOUNTER — Ambulatory Visit: Payer: Medicare HMO | Admitting: Neurology

## 2018-01-26 VITALS — BP 116/82 | HR 80 | Wt 136.0 lb

## 2018-01-26 DIAGNOSIS — I69354 Hemiplegia and hemiparesis following cerebral infarction affecting left non-dominant side: Secondary | ICD-10-CM | POA: Diagnosis not present

## 2018-01-26 NOTE — Telephone Encounter (Signed)
Patient states botox appt on 10/2 w/ Dr. Terrace ArabiaYan is a conflict. Patient requested that the appt be changed to the week before or week after.

## 2018-01-26 NOTE — Progress Notes (Signed)
[facility]  CC:  Spastic hemiplegia  [name] is a 67  y.o. 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 Marya Fossa, received EMG guided Botox injection in 2010 for about 2 years, with good benefit, but has difficulty with insurance  She began to receive Botox injection through our clinic by Dr. Hosie Poisson since May 2015,150 units total to left upper extremity, which has been very helpful  She is return for continued EMG guided Botox injection, at baseline, she can ambulate with a left AFO, but without assistance,  Profound left upper extremity spasticity, achiness, chronic pain, persistent left wrist flexion, finger flexion,  She also has a history of coronary artery disease in 2007, taking Plavix, and Aggrenox, exercise regularly, has a personal trainer  UPDATE March 2nd 2016: She received 300 units in Jun 13 2014, to left upper extremity, which has been very helpful, she did not notice significant side effect. The injection help her open her left hand better, no significant side effect noticed.  UPDATE June 8th 2016: She received 300 units of Botox A in March second 2016, responded very well, she was able to relax her left hand better, she ambulate without assistance with spastic left hemiparesis, complains of left facial, left foot pain, wants the injection to emphasize on her left finger flexion wrist flexion today.  UPDATE Sep 28th 2016: She responded to previous Botox injection very well in June, she wants to emphasize on her left upper extremity at this time, complains forceful finger contraction, flexion, elbow flexion. Only mild left shoulder pain with passive  movement.  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 Patient is concerned about potential side effect of weakness.  Update May 21 2016: She responded very well to previous injection in July, only wants to receive injection for spastic left upper extremity, she had a pair of left wrist/finger splint,  Update September 09 2016: She is now using a left wrist splint, which has been helpful,  UPDATE Dec 10 2016: She responded well to previous injection no significant side effect noticed,   UPDATE Apr 14 2017: She wear left wrist pain, denies significant side effect, complains of left-sided neuropathic pain, despite Prozac 20 mg, Nucynta 50 mg, gabapentin 600 mg twice a day,  UPDATE Jul 14 2017: She responded very well to previous injection in September 2018,  UPDATE October 13 2017: She responded very well to previous Botox injection  UPDATE January 26 2018: She complains of left leg weakness was injection, wants to focus injection on left upper extremity,  Social History   Socioeconomic History  . Marital status: Married    Spouse name: Not on file  . Number  of children: Not on file  . Years of education: Not on file  . Highest education level: Not on file  Occupational History  . Occupation: disabled  Social Needs  . Financial resource strain: Not on file  . Food  insecurity:    Worry: Not on file    Inability: Not on file  . Transportation needs:    Medical: Not on file    Non-medical: Not on file  Tobacco Use  . Smoking status: Former Games developer  . Smokeless tobacco: Never Used  . Tobacco comment: Quit in 2005  Substance and Sexual Activity  . Alcohol use: Yes    Comment: social  . Drug use: No  . Sexual activity: Not Currently  Lifestyle  . Physical activity:    Days per week: Not on file    Minutes per session: Not on file  . Stress: Not on file  Relationships  . Social connections:    Talks on phone: Not on file    Gets together: Not on file    Attends religious service: Not on file    Active member of club or organization: Not on file    Attends meetings of clubs or organizations: Not on file    Relationship status: Not on file  . Intimate partner violence:    Fear of current or ex partner: Not on file    Emotionally abused: Not on file    Physically abused: Not on file    Forced sexual activity: Not on file  Other Topics Concern  . Not on file  Social History Narrative   Married, no children   Right handed   12th grade   1 cup daily    Family History  Problem Relation Age of Onset  . Arrhythmia Father        PPM  . Colon cancer Neg Hx     Past Medical History:  Diagnosis Date  . AMI (acute myocardial infarction) (HCC) 2007   failed PCI to the distal LAD - managed medically  . Chronic pain   . CVA (cerebral vascular accident) (HCC) 1999  . Degenerative disc disease, cervical   . Hypothyroidism   . Unstable angina [facility])     Past Surgical History:  Procedure Laterality Date  . ENDOMETRIAL ABLATION  3/06  . left ankle surgery      Current Outpatient Medications  Medication Sig Dispense Refill  . ALPRAZolam (XANAX) 0.5 MG tablet Take 0.5 mg by mouth 2 (two) times daily as needed.      Marland Kitchen aspirin 81 MG tablet Take 81 mg by mouth daily.    Marland Kitchen BOTOX 100 units SOLR injection INJECT AS DIRECTED AT PHYSICIANS OFFICE  (Patient taking differently: INJECT AS DIRECTED AT PHYSICIANS OFFICE (500 units)) 5 vial 2  . cetirizine (ZYRTEC) 10 MG tablet Take 10 mg by mouth daily.      . Cholecalciferol (VITAMIN D3) 1000 units CAPS Take 1,000 Units by mouth daily.    . clopidogrel (PLAVIX) 75 MG tablet Take 1 tablet (75 mg total) by mouth daily. 90 tablet 3  . DENTA 5000 PLUS 1.1 % CREA dental cream Place 1 application onto teeth at bedtime.   4  . diphenhydrAMINE (BENADRYL) 25 MG tablet Take 25 mg by mouth every 6 (six) hours as needed.      . DULoxetine (CYMBALTA) 60 MG capsule Take 1 capsule (60 mg total) by mouth daily. 30 capsule 11  . FLUoxetine (PROZAC) 20 MG capsule Take 20 mg  by mouth daily.      Marland Kitchen. gabapentin (NEURONTIN) 600 MG tablet Take 600 mg by mouth 2 (two) times daily.     . hydrocortisone (ANUSOL-HC) 2.5 % rectal cream Place 1 application rectally 2 (two) times daily. 30 g 0  . HYDROCORTISONE ACE, RECTAL, 30 MG SUPP Place 1 suppository rectally as needed.     Marland Kitchen. levothyroxine (SYNTHROID, LEVOTHROID) 75 MCG tablet Take 75 mcg by mouth daily before breakfast.    . naloxegol oxalate (MOVANTIK) 25 MG TABS tablet Take 1 tablet (25 mg total) by mouth daily. Take one hour before food or two hours after. 10 tablet 0  . nitroGLYCERIN (NITROSTAT) 0.4 MG SL tablet Place 1 tablet (0.4 mg total) under the tongue every 5 (five) minutes as needed. 25 tablet 3  . NUCYNTA ER 50 MG 12 hr tablet TAKE 1 TABLET TWO TIMES DAILY FOR 30 DAYS  0  . pantoprazole (PROTONIX) 40 MG tablet Take 40 mg by mouth as needed.     . polyethylene glycol powder (GLYCOLAX/MIRALAX) powder MIX 17GM IN 8 OZ. OF WATER DAILY  6  . polyethylene glycol-electrolytes (TRILYTE) 420 g solution Take 4,000 mLs by mouth as directed. 4000 mL 0  . ranolazine (RANEXA) 500 MG 12 hr tablet Take 1 tablet (500 mg total) by mouth as needed. 60 tablet 11  . rOPINIRole (REQUIP) 0.5 MG tablet Take 2 mg by mouth Daily.     . simvastatin (ZOCOR) 40 MG tablet Take 40 mg by  mouth at bedtime.      . tapentadol (NUCYNTA) 50 MG tablet Take 50 mg by mouth.    . fluticasone (FLONASE) 50 MCG/ACT nasal spray fluticasone 50 mcg/actuation nasal spray,suspension     No current facility-administered medications for this visit.     Allergies as of 01/26/2018 - Review Complete 01/26/2018  Allergen Reaction Noted  . Morphine  01/01/2009  . Penicillins  01/01/2009  . Zolpidem tartrate  01/01/2009    Vitals: BP 116/82   Pulse 80   Wt 136 lb (61.7 kg)   BMI 25.70 kg/m  Last Weight:  Wt Readings from Last 1 Encounters:  01/26/18 136 lb (61.7 kg)   Last Height:   Ht Readings from Last 1 Encounters:  11/17/17 5\' 1"  (1.549 m)   PHYSICAL EXAMINATOINS:  Generalized: In no acute distress  Neck: Supple, no carotid bruits   Cardiac: Regular rate rhythm  Pulmonary: Clear to auscultation bilaterally  Musculoskeletal: No deformity  Neurological examination  Mentation: Alert oriented to time, place, history taking, and causual conversation, left lower face weakness  Cranial nerve II-XII: Pupils were equal round reactive to light extraocular movements were full, left visual field cut.   Left lower face weakness. hearing was intact to finger rubbing bilaterally. Uvula tongue midline.  head turning and shoulder shrug and were normal and symmetric.Tongue protrusion into cheek strength was normal.  Motor: spastic left hemiparesis,  Wearing left AFO, left lower extremity moderate spasticity, left hip flexion 4, knee flexion 4, extension 5, Profound left upper extremity spasticity, left shoulder adduction, internal rotation.left elbow flexion, pronation, with passive movement, mild fixed contraction of left elbow, maximum 175, left wrist forceful flexion, finger flexion,thumb in position, even with forceful movement, she could not achieve full extension of her left finger  Gait: Rising up from seated position by pushing on chair arm,spastic left hemi-circumferential  gait.  Deep tendon reflexes: hyperreflexia of left side   Assessment and plan:   67 years old right-handed Caucasian female,  status post stroke, with spastic left hemiparesis since 1999, responded very well to previous EMG guided Botox injection, return to clinic for repeat injection  Under electric stimulation, 500 units of Botox A was injected into left upper extremity muscles  Left pronator teres 25 x2 =50 units Left flexor carpi radialis 50 units Left  flexor carpi ulnaris 25 x2=50units Left flexor digitorum profundus 25x3=75 units  Left palmaris longus 25 x2=50 units Left opponents 25 units  Left pectoralis major 50x2=100 units Left teres major 50 units Left latissimus dorsi 50units    [name], M.D. Ph.D.  [facility] [address] [place], [place] [address] Phone: [phone] Fax:      [phone]

## 2018-01-27 NOTE — Telephone Encounter (Signed)
Pt called back today, [facility] r[facility]s to 10/9  [facility]

## 2018-01-27 NOTE — Telephone Encounter (Signed)
Noted, thank you

## 2018-02-01 ENCOUNTER — Telehealth: Payer: Self-pay | Admitting: Nurse Practitioner

## 2018-02-01 NOTE — Telephone Encounter (Signed)
S/w pt is aware to has appt tomorrow with Lawson FiscalLori due to phone call.

## 2018-02-01 NOTE — Telephone Encounter (Signed)
New Message   Pt c/o of Chest Pain: STAT if CP now or developed within 24 hours  1. Are you having CP right now? no  2. Are you experiencing any other symptoms (ex. SOB, nausea, vomiting, sweating)? Sob sometimes  3. How long have you been experiencing CP? A month  4. Is your CP continuous or coming and going? Coming and going  5. Have you taken Nitroglycerin? No she took renexa ?

## 2018-02-01 NOTE — Progress Notes (Addendum)
CARDIOLOGY OFFICE NOTE  Date:  02/02/2018    [name] Date of Birth: [birth year] Medical Record #[id number]  PCP:  Selinda Flavin, MD  Cardiologist:  Tyrone Sage     Chief Complaint  Patient presents with  . Chest Pain    Work in visit     History of Present Illness: KAIRA STRINGFIELD is a 67 y.o. female who presents today for a work in visit. She is a former patient of Dr. Vern Claude - now following by me.   She hasa history ofCAD with past MI in 2007 and had failed PCI to the distal LAD - managed medically -hasused"PRN" Ranexa since that time. Other issues include prior CVA from 1999, hypothyroidism, HLD and GERD.  When seen in January of 2017 was having some chest pain. Her exercise tolerance was stable. We did get her Myoview updated and this looked ok.She remains mostly limited by significant nerve pain fromaremote stroke but she continues to "press on"in quite an amazing fashion. Last visit with me was in January of 2019 - stable from our standpoint. She had not used NTG in some time but still would use "prn" Ranexa as instructed in the remote past.   Phone call earlier this week - added to my schedule for today.   Comes in today. Here with her husband. Little hard to say how long there has been a change - certainly over the past month at least. Husband and PCP urged her to call. She has noted more "headache" which is then followed by chest pain. She has had some intermittent jaw pain as well. This is her typical chest pain pattern. She is using more "prn" Ranexa - does not take every day but has taken more over the last several weeks. Did not tolerate in the remote past BID dosing - it would cause a headace. No NTG use - typically only lasts for a few minutes and then passes. Seems to occur when "rushed" and out of her routine. She continues to go to the gym regularly - she still uses the treadmill/bike - she has no symptoms there - actually feels better. She is to have  colonoscopy later this month.  Husband notes that she is telling him more how she does not feel well, had to use Ranexa, had to lie down, etc.  Has probably had more fatigue as well.   Past Medical History:  Diagnosis Date  . AMI (acute myocardial infarction) (HCC) 2007   failed PCI to the distal LAD - managed medically  . Chronic pain   . CVA (cerebral vascular accident) (HCC) 1999  . Degenerative disc disease, cervical   . Hypothyroidism   . Unstable angina [facility])     Past Surgical History:  Procedure Laterality Date  . ENDOMETRIAL ABLATION  3/06  . left ankle surgery       Medications: Current Meds  Medication Sig  . ALPRAZolam (XANAX) 0.5 MG tablet Take 0.5 mg by mouth 2 (two) times daily as needed.    Marland Kitchen aspirin 81 MG tablet Take 81 mg by mouth daily.  . cetirizine (ZYRTEC) 10 MG tablet Take 10 mg by mouth daily.    . Cholecalciferol (VITAMIN D3) 1000 units CAPS Take 1,000 Units by mouth daily.  . clopidogrel (PLAVIX) 75 MG tablet Take 1 tablet (75 mg total) by mouth daily.  . DENTA 5000 PLUS 1.1 % CREA dental cream Place 1 application onto teeth at bedtime.   . diphenhydrAMINE (BENADRYL) 25  MG tablet Take 25 mg by mouth every 6 (six) hours as needed.    . DULoxetine (CYMBALTA) 60 MG capsule Take 1 capsule (60 mg total) by mouth daily.  Marland Kitchen FLUoxetine (PROZAC) 20 MG capsule Take 20 mg by mouth daily.    Marland Kitchen gabapentin (NEURONTIN) 600 MG tablet Take 600 mg by mouth 2 (two) times daily.   . hydrocortisone (ANUSOL-HC) 2.5 % rectal cream Place 1 application rectally 2 (two) times daily.  Marland Kitchen HYDROCORTISONE ACE, RECTAL, 30 MG SUPP Place 1 suppository rectally as needed.   Marland Kitchen levothyroxine (SYNTHROID, LEVOTHROID) 75 MCG tablet Take 75 mcg by mouth daily before breakfast.  . naloxegol oxalate (MOVANTIK) 25 MG TABS tablet Take 1 tablet (25 mg total) by mouth daily. Take one hour before food or two hours after.  . nitroGLYCERIN (NITROSTAT) 0.4 MG SL tablet Place 1 tablet (0.4 mg total) under  the tongue every 5 (five) minutes as needed.  Raylene Miyamoto ER 50 MG 12 hr tablet TAKE 1 TABLET TWO TIMES DAILY FOR 30 DAYS  . OnabotulinumtoxinA, Cosmetic, (BOTOX COSMETIC IM) Inject 500 mg into the muscle every 3 (three) months.  . pantoprazole (PROTONIX) 40 MG tablet Take 40 mg by mouth as needed.   . polyethylene glycol powder (GLYCOLAX/MIRALAX) powder MIX 17GM IN 8 OZ. OF WATER DAILY  . polyethylene glycol-electrolytes (TRILYTE) 420 g solution Take 4,000 mLs by mouth as directed.  . ranolazine (RANEXA) 500 MG 12 hr tablet Take 1 tablet (500 mg total) by mouth daily.  Marland Kitchen rOPINIRole (REQUIP) 0.5 MG tablet Take 2 mg by mouth Daily.   . simvastatin (ZOCOR) 40 MG tablet Take 20 mg by mouth at bedtime.    . [DISCONTINUED] ranolazine (RANEXA) 500 MG 12 hr tablet Take 1 tablet (500 mg total) by mouth as needed.     Allergies: Allergies  Allergen Reactions  . Morphine   . Penicillins   . Zolpidem Tartrate     Social History: The patient  reports that she has quit smoking. She has never used smokeless tobacco. She reports that she drinks alcohol. She reports that she does not use drugs.   Family History: The patient's family history includes Arrhythmia in her father.   Review of Systems: Please see the history of present illness.   Otherwise, the review of systems is positive for none.   All other systems are reviewed and negative.   Physical Exam: VS:  BP 118/88 (BP Location: Left Arm, Patient Position: Sitting, Cuff Size: Normal)   Pulse 86   Ht 5\' 2"  (1.575 m)   Wt 133 lb 12.8 oz (60.7 kg)   BMI 24.47 kg/m  .  BMI Body mass index is 24.47 kg/m.  Wt Readings from Last 3 Encounters:  02/02/18 133 lb 12.8 oz (60.7 kg)  01/26/18 136 lb (61.7 kg)  11/17/17 131 lb 3.2 oz (59.5 kg)    General: Pleasant. Well developed, well nourished and in no acute distress.   HEENT: Normal.  Neck: Supple, no JVD, carotid bruits, or masses noted.  Cardiac: Regular rate and rhythm. No murmurs, rubs,  or gallops. No edema.  Respiratory:  Lungs are clear to auscultation bilaterally with normal work of breathing.  GI: Soft and nontender.  MS: No deformity or atrophy. Gait and ROM intact.  Skin: Warm and dry. Color is normal.  Neuro:  Strength and sensation are intact and no gross focal deficits noted.  Psych: Alert, appropriate and with normal affect.   LABORATORY DATA:  EKG:  EKG is ordered today. This demonstrates NSR.  Lab Results  Component Value Date   WBC 7.7 11/17/2017   HGB 13.9 11/17/2017   HCT 41.0 11/17/2017   PLT 307 11/17/2017   GLUCOSE 104 (H) 11/17/2017   CHOL 164 09/10/2009   TRIG 111.0 09/10/2009   HDL 55.70 09/10/2009   LDLCALC 86 09/10/2009   ALT 13 11/17/2017   AST 17 11/17/2017   NA 143 11/17/2017   K 4.6 11/17/2017   CL 104 11/17/2017   CREATININE 0.70 11/17/2017   BUN 16 11/17/2017   CO2 26 11/17/2017   TSH 0.940 11/17/2017     BNP (last 3 results) No results for input(s): BNP in the last 8760 hours.  ProBNP (last 3 results) No results for input(s): PROBNP in the last 8760 hours.   Other Studies Reviewed Today:   Assessment/Plan: MyoviewStudy Result1/2017   No diagnostic ST segment changes to indicate ischemia.  Small, mild intensity, lateral apical region of ischemia in the setting of artifact from extracardiac radiotracer uptake and also arms down imaging. No large ischemic territories.  This is a low risk study.  Nuclear stress EF: 74%.     Echo Study Conclusions from 2011 Left ventricle: The cavity size was normal. Systolic function was normal. The estimated ejection fraction was in the range of 55% to 60%. Wall motion was normal; there were no regional wall motion abnormalities.    CARDIAC CATHETERIZATION 2007  HEMODYNAMICS:  1. Left main coronary artery: The left main coronary artery is free of  significant disease.  2. Left anterior descending artery: The left anterior descending artery  gave rise  to two diagonal branches and a septal perforator. The LAD was  quite tortuous in its mid portion and then had a 99% occlusion in its  distal portion with TIMI-I flow.  3. Circumflex artery: The circumflex artery gave rise to an atrial branch,  marginal branch, and two posterolateral branches. These vessels were  free of significant disease.  4. Right coronary artery: The right coronary artery is a moderate size  vessel which gave rise to a clonus branch, right ventricular branch,  posterior descending branch, and a posterolateral branch. This vessel  was free of significant disease.  LEFT VENTRICULOGRAM: The left ventriculogram was obtained in the RAO  projection showed akinesis of the tip of the apex in the inferoapical  segment. The overall wall motion was good with an estimated ejection  fraction of 60%.  Following attempt at PCA there was persistent 99% stenosis in the distal LAD  with TIMI-I flow.  CONCLUSION:  1. Coronary artery disease, status post recent non-ST-elevation myocardial  infarction with 99% stenosis in the distal left anterior descending with  TIMI-I flow, no significant obstruction in the circumflex and right  coronary arteries, and inferoapical wall akinesis.  2. Unsuccessful percutaneous coronary intervention of the lesion in the  distal left anterior descending due to inability to cross with the wire.  DISPOSITION: The patient returned to the postanesthesia care unit for  further observation. I think her outlook should be good on medical therapy.  She has no significant residual coronary disease and overall LV function is  good.  ______________________________  Charlies ConstableBruce Brodie, M.D. [facility]  BB/MEDQ D: 08/25/2005 T: 08/25/2005 Job: [id number]   Assessment / Plan:  1. CAD - remote NSTEMI - with unsuccessful PCI to the distal LAD back in 2007 and no significant residual CAD - managed medically.She has chronically used prn Ranexa. She had a  low  risk Myoview from 2017. EF is normal. She now notes change in symptoms but not with actual exertion - she can still go to the gym and exercise. Probably with more fatigue. We have agreed to take one dose of Ranexa every day - use NTG prn - see back in about 10 days - low threshold to proceed with cath if she fails to improve. Her last study was 12 years ago.   2. HLD - on statin - monitored by PCP - her recent labs are noted. She will need to cut her Zocor back to 20 mg due to regularly taking Ranexa.    3. Prior stroke - on chronic Plavix. She is limited by residual nerve pain. She has chronic left sided weakness.   4. Chronic nerve pain - on Botox and narcotic therapy.Not discussed.  5. Need for colonoscopy - has had some recent GI issues - would hold for now - would like to get her cardiac status stabilized.     Current medicines are reviewed with the patient today.  The patient does not have concerns regarding medicines other than what has been noted above.  The following changes have been made:  See above.  Labs/ tests ordered today include:    Orders Placed This Encounter  Procedures  . EKG 12-Lead     Disposition:   FU with me in about 10 days.   Patient is agreeable to this plan and will call if any problems develop in the interim.   SignedNorma Fredrickson, NP  02/02/2018 9:42 AM  [facility] [address] [place], [place]  [address] Phone: [phone] Fax: [phone]

## 2018-02-02 ENCOUNTER — Telehealth: Payer: Self-pay | Admitting: *Deleted

## 2018-02-02 ENCOUNTER — Ambulatory Visit: Payer: Medicare HMO | Admitting: Nurse Practitioner

## 2018-02-02 ENCOUNTER — Other Ambulatory Visit: Payer: Self-pay | Admitting: *Deleted

## 2018-02-02 ENCOUNTER — Encounter: Payer: Self-pay | Admitting: Nurse Practitioner

## 2018-02-02 VITALS — BP 118/88 | HR 86 | Ht 62.0 in | Wt 133.8 lb

## 2018-02-02 DIAGNOSIS — I259 Chronic ischemic heart disease, unspecified: Secondary | ICD-10-CM | POA: Diagnosis not present

## 2018-02-02 DIAGNOSIS — E7800 Pure hypercholesterolemia, unspecified: Secondary | ICD-10-CM

## 2018-02-02 DIAGNOSIS — R079 Chest pain, unspecified: Secondary | ICD-10-CM | POA: Diagnosis not present

## 2018-02-02 MED ORDER — RANOLAZINE ER 500 MG PO TB12: 500.0000 mg | ORAL_TABLET | Freq: Every day | ORAL | 3 refills | Status: DC

## 2018-02-02 NOTE — Telephone Encounter (Signed)
Spoke with patient and she states she recently started to have chest pain. She is seeing cardiology (notes in epic) and they are starting her on a new medication. She may possible needs a cardiac cath depending on how she does on the new medication. Patient was told to push out her TCS. Patient last saw Verlon AuLeslie in the office. Will she need another OV to get r/s'd since she has recently started having problems? Thanks

## 2018-02-02 NOTE — Telephone Encounter (Signed)
Reviewed cardiology note requesting postponing colonoscopy until her cardiac status is stabilized.   Please cancel TCS. Schedule return OV nonurgent here. Have patient call us when she is cleared by cardiology for a colonoscopy (we can ask [facility] at that time if patient can be triaged instead of OV).

## 2018-02-02 NOTE — Patient Instructions (Addendum)
We will be checking the following labs today - NONE   Medication Instructions:    Continue with your current medicines. BUT  I want you to take the Ranexa every morning every day. I have sent this to your pharmacy.   Cut the Zocor in half to 20 mg a day    Testing/Procedures To Be Arranged:  N/A  Follow-Up:   See me in about 10 to 14 days to see me.     Other Special Instructions:   Move your visit for Colonoscpy to a later date    If you need a refill on your cardiac medications before your next appointment, please call your pharmacy.   Call the [facility] office at [phone] if you have any questions, problems or concerns.

## 2018-02-04 NOTE — Telephone Encounter (Signed)
Patient is aware and will call once she is cleared. stacey please schedule NA f/u.

## 2018-02-07 ENCOUNTER — Encounter: Payer: Self-pay | Admitting: Gastroenterology

## 2018-02-07 NOTE — Telephone Encounter (Signed)
PATIENT SCHEDULED AND LETTER SENT  °

## 2018-02-08 ENCOUNTER — Inpatient Hospital Stay (HOSPITAL_COMMUNITY): Admission: RE | Admit: 2018-02-08 | Payer: Medicare HMO | Source: Ambulatory Visit

## 2018-02-08 DIAGNOSIS — G894 Chronic pain syndrome: Secondary | ICD-10-CM | POA: Diagnosis not present

## 2018-02-08 DIAGNOSIS — Z79891 Long term (current) use of opiate analgesic: Secondary | ICD-10-CM | POA: Diagnosis not present

## 2018-02-08 DIAGNOSIS — G890 Central pain syndrome: Secondary | ICD-10-CM | POA: Diagnosis not present

## 2018-02-08 DIAGNOSIS — Z79899 Other long term (current) drug therapy: Secondary | ICD-10-CM | POA: Diagnosis not present

## 2018-02-14 ENCOUNTER — Encounter: Payer: Self-pay | Admitting: Nurse Practitioner

## 2018-02-14 ENCOUNTER — Ambulatory Visit: Payer: Medicare HMO | Admitting: Nurse Practitioner

## 2018-02-14 VITALS — BP 110/80 | HR 85 | Ht 62.0 in | Wt 136.8 lb

## 2018-02-14 DIAGNOSIS — I259 Chronic ischemic heart disease, unspecified: Secondary | ICD-10-CM | POA: Diagnosis not present

## 2018-02-14 NOTE — Patient Instructions (Addendum)
We will be checking the following labs today - NONE  Fasting labs on return   Medication Instructions:    Continue with your current medicines.     Testing/Procedures To Be Arranged:  N/A  Follow-Up:   See me in 2 months    Other Special Instructions:   N/A    If you need a refill on your cardiac medications before your next appointment, please call your pharmacy.   Call the [facility] office at [phone] if you have any questions, problems or concerns.

## 2018-02-14 NOTE — Progress Notes (Signed)
CARDIOLOGY OFFICE NOTE  Date:  02/14/2018    [name] Date of Birth: Jun 03, 1951 Medical Record #[id number]  PCP:  Selinda Flavin, MD  Cardiologist:  Tyrone Sage     Chief Complaint  Patient presents with  . Coronary Artery Disease    Recheck visit     History of Present Illness: [name] is a 67 y.o. female who presents today for a recheck visit. She is a former patient of Dr. Vern Claude - now following by me.   She hasa history ofCAD with past MI in 2007 and had failed PCI to the distal LAD - managed medically -hasused"PRN" Ranexa since that time. Other issues include prior CVA from 1999, hypothyroidism, HLD and GERD.  When seen in January of 2017 washaving some chest pain. Her exercise tolerance was stable. We did get her Myoview updated and this looked ok.Sheremains mostly limited bysignificant nerve pain fromaremote stroke but she continues to "press on"in quite an amazing fashion.Last visit with me was in January of 2019 - stable from our standpoint.She had not used NTG in some time but still would use "prn" Ranexa as instructed in the remote past.   Seen earlier this month as a work in - having lots of symptoms . Husband noted a definite change with more fatigue and more complaints of chest pain. 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  Elected to start daily Ranexa versus proceeding with cardiac cath - she has not been able to tolerate BID dosing per her report due to "terrific headache".   Comes in today. Herewith her husband. She is better. Husband agrees. No chest pain. No jaw pain. Less headache. Tolerating the daily Ranexa without issue. She has postponed her colonoscopy. We did have to cut her dose of Zocor back due to the Ranexa. She continues to go to the gym without issue.    Past Medical History:  Diagnosis Date  . AMI (acute myocardial infarction) (HCC) 2007   failed PCI to the distal  LAD - managed medically  . Chronic pain   . CVA (cerebral vascular accident) (HCC) 1999  . Degenerative disc disease, cervical   . Hypothyroidism   . Unstable angina [facility])     Past Surgical History:  Procedure Laterality Date  . ENDOMETRIAL ABLATION  3/06  . left ankle surgery       Medications: No outpatient medications have been marked as taking for the 02/14/18 encounter (Office Visit) with Rosalio Macadamia, NP.     Allergies: Allergies  Allergen Reactions  . Morphine   . Penicillins   . Zolpidem Tartrate     Social History: The patient  reports that she has quit smoking. She has never used smokeless tobacco. She reports that she drinks alcohol. She reports that she does not use drugs.   Family History: The patient's family history includes Arrhythmia in her father.   Review of Systems: Please see the history of present illness.   Otherwise, the review of systems is positive for none.   All other systems are reviewed and negative.   Physical Exam: VS:  BP 110/80 (BP Location: Left Arm, Patient Position: Sitting, Cuff Size: Normal)   Pulse 85   Ht 5\' 2"  (1.575 m)   Wt 136 lb 12.8 oz (62.1 kg)   SpO2 93%   BMI 25.02 kg/m  .  BMI Body mass index is 25.02 kg/m.  Wt Readings from Last  3 Encounters:  02/14/18 136 lb 12.8 oz (62.1 kg)  02/02/18 133 lb 12.8 oz (60.7 kg)  01/26/18 136 lb (61.7 kg)    General: Pleasant. Well developed, well nourished and in no acute distress.   HEENT: Normal.  Neck: Supple, no JVD, carotid bruits, or masses noted.  Cardiac: Regular rate and rhythm. No murmurs, rubs, or gallops. No edema.  Respiratory:  Lungs are clear to auscultation bilaterally with normal work of breathing.  GI: Soft and nontender.  MS: No deformity or atrophy. Gait and ROM intact.  Skin: Warm and dry. Color is normal.  Neuro:  Strength and sensation are intact and no gross focal deficits noted.  Psych: Alert, appropriate and with normal affect.   LABORATORY  DATA:  EKG:  EKG is not ordered today.  Lab Results  Component Value Date   WBC 7.7 11/17/2017   HGB 13.9 11/17/2017   HCT 41.0 11/17/2017   PLT 307 11/17/2017   GLUCOSE 104 (H) 11/17/2017   CHOL 164 09/10/2009   TRIG 111.0 09/10/2009   HDL 55.70 09/10/2009   LDLCALC 86 09/10/2009   ALT 13 11/17/2017   AST 17 11/17/2017   NA 143 11/17/2017   K 4.6 11/17/2017   CL 104 11/17/2017   CREATININE 0.70 11/17/2017   BUN 16 11/17/2017   CO2 26 11/17/2017   TSH 0.940 11/17/2017     BNP (last 3 results) No results for input(s): BNP in the last 8760 hours.  ProBNP (last 3 results) No results for input(s): PROBNP in the last 8760 hours.   Other Studies Reviewed Today:   Assessment/Plan: MyoviewStudy Result1/2017   No diagnostic ST segment changes to indicate ischemia.  Small, mild intensity, lateral apical region of ischemia in the setting of artifact from extracardiac radiotracer uptake and also arms down imaging. No large ischemic territories.  This is a low risk study.  Nuclear stress EF: 74%.     Echo Study Conclusions from 2011 Left ventricle: The cavity size was normal. Systolic function was normal. The estimated ejection fraction was in the range of 55% to 60%. Wall motion was normal; there were no regional wall motion abnormalities.    CARDIAC CATHETERIZATION 2007  HEMODYNAMICS:  1. Left main coronary artery: The left main coronary artery is free of  significant disease.  2. Left anterior descending artery: The left anterior descending artery  gave rise to two diagonal branches and a septal perforator. The LAD was  quite tortuous in its mid portion and then had a 99% occlusion in its  distal portion with TIMI-I flow.  3. Circumflex artery: The circumflex artery gave rise to an atrial branch,  marginal branch, and two posterolateral branches. These vessels were  free of significant disease.  4. Right coronary artery: The right  coronary artery is a moderate size  vessel which gave rise to a clonus branch, right ventricular branch,  posterior descending branch, and a posterolateral branch. This vessel  was free of significant disease.  LEFT VENTRICULOGRAM: The left ventriculogram was obtained in the RAO  projection showed akinesis of the tip of the apex in the inferoapical  segment. The overall wall motion was good with an estimated ejection  fraction of 60%.  Following attempt at PCA there was persistent 99% stenosis in the distal LAD  with TIMI-I flow.  CONCLUSION:  1. Coronary artery disease, status post recent non-ST-elevation myocardial  infarction with 99% stenosis in the distal left anterior descending with  TIMI-I flow, no significant obstruction  in the circumflex and right  coronary arteries, and inferoapical wall akinesis.  2. Unsuccessful percutaneous coronary intervention of the lesion in the  distal left anterior descending due to inability to cross with the wire.  DISPOSITION: The patient returned to the postanesthesia care unit for  further observation. I think her outlook should be good on medical therapy.  She has no significant residual coronary disease and overall LV function is  good.  ______________________________  Charlies Constable, M.D. [facility]  BB/MEDQ D: 08/25/2005 T: 08/25/2005 Job: [id number]   Assessment / Plan:  1. CAD - remote NSTEMI - with unsuccessful PCI to the distal LAD back in 2007 and no significant residual CAD - she has been managed medically since that time. She was previously using prn Ranexa. She has had recent onset of fatigue/headache/chest/jaw pain - we elected to continue with medical management - she is quite worried about a recurrent stroke with a cath. Her symptoms have improved - we will stay on the current plan of care - I will see her in 2 months - would continue to hold on the colonoscopy until after I see her back and then proceed.   2. HLD - I  had to cut her dose back of statin - will recheck lab when I see her back - may need to switch to more intense agent.   3. Prior stroke - on chronic Plavix by neurology.She is limited by residual nerve pain.She has chronic left sided weakness.   4. Chronic nerve pain - on Botox and narcotic therapy.Not discussed.  5. Need for colonoscopy - has had some recent GI issues - we will continue to hold until I see her back. If she does well over the next 2 months - then could proceed.    Current medicines are reviewed with the patient today.  The patient does not have concerns regarding medicines other than what has been noted above.  The following changes have been made:  See above.  Labs/ tests ordered today include:   No orders of the defined types were placed in this encounter.    Disposition:   FU with me in about 2 months - recheck fasting labs on return.   Patient is agreeable to this plan and will call if any problems develop in the interim.   SignedNorma Fredrickson, NP  02/14/2018 1:57 PM  [facility] [address] [place], [place]  [address] Phone: [phone] Fax: [phone]

## 2018-02-15 ENCOUNTER — Ambulatory Visit (HOSPITAL_COMMUNITY): Admission: RE | Admit: 2018-02-15 | Payer: Medicare HMO | Source: Ambulatory Visit | Admitting: Gastroenterology

## 2018-02-15 ENCOUNTER — Encounter (HOSPITAL_COMMUNITY): Admission: RE | Payer: Self-pay | Source: Ambulatory Visit

## 2018-02-15 SURGERY — COLONOSCOPY WITH PROPOFOL
Anesthesia: Monitor Anesthesia Care

## 2018-03-02 DIAGNOSIS — L89892 Pressure ulcer of other site, stage 2: Secondary | ICD-10-CM | POA: Diagnosis not present

## 2018-03-02 DIAGNOSIS — M79672 Pain in left foot: Secondary | ICD-10-CM | POA: Diagnosis not present

## 2018-03-08 DIAGNOSIS — G894 Chronic pain syndrome: Secondary | ICD-10-CM | POA: Diagnosis not present

## 2018-03-08 DIAGNOSIS — G890 Central pain syndrome: Secondary | ICD-10-CM | POA: Diagnosis not present

## 2018-03-21 DIAGNOSIS — H00029 Hordeolum internum unspecified eye, unspecified eyelid: Secondary | ICD-10-CM | POA: Insufficient documentation

## 2018-03-21 DIAGNOSIS — Z6823 Body mass index (BMI) 23.0-23.9, adult: Secondary | ICD-10-CM | POA: Diagnosis not present

## 2018-03-21 DIAGNOSIS — H00025 Hordeolum internum left lower eyelid: Secondary | ICD-10-CM | POA: Diagnosis not present

## 2018-03-29 ENCOUNTER — Encounter: Payer: Self-pay | Admitting: Nurse Practitioner

## 2018-04-05 DIAGNOSIS — G894 Chronic pain syndrome: Secondary | ICD-10-CM | POA: Diagnosis not present

## 2018-04-05 DIAGNOSIS — G890 Central pain syndrome: Secondary | ICD-10-CM | POA: Diagnosis not present

## 2018-04-05 DIAGNOSIS — M542 Cervicalgia: Secondary | ICD-10-CM | POA: Diagnosis not present

## 2018-04-18 NOTE — Progress Notes (Signed)
CARDIOLOGY OFFICE NOTE  Date:  04/19/2018    [name] Date of Birth: 04-02-1951 Medical Record #[id number]  PCP:  Dawn Flavin, MD  Cardiologist:  [name]     Chief Complaint  Patient presents with  . Coronary Artery Disease    Follow up visit     History of Present Illness: [name] is a 67 y.o. female who presents today for a follow up visit. She is a former patient of Dr. Vern Newton - now following by me.   She hasa history ofCAD with past MI in 2007 and had failed PCI to the distal LAD - managed medically -hasused"PRN" Ranexa since that time. On chronic DAPT with aspirin/Plavix. Other issues include prior CVA from 1999 (on aspirin/Plavix), hypothyroidism, HLD and GERD.  When seen in January of 2017 washaving some chest pain. Her exercise tolerance was stable. We did get her Myoview updated and this looked ok.Sheremains mostly limited bysignificant nerve pain fromaremote stroke but she continues to "press on"in quite an amazing fashion.Last visit with me was in January of 2019- stable from our standpoint.She had not used NTG in some time but still would use "prn" Ranexa as instructed in the remote past.   Seen back in July - having lots of symptoms . Husband noted a definite change with more fatigue and more complaints of chest pain. 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  Elected to start daily Ranexa versus proceeding with cardiac cath - she has not been able to tolerate BID dosing per her report due to "terrific headache". On return she was much improved and we elected to proceed on with medical therapy. We did have to cut her Zocor back and she postponed her colonoscopy.   Comes in today. Herewith her husband. Several issues. She has had 2 falls since here. Her left foot "got hung up" and she fell in the laundry room.  No serious injury. She fell out of bed previously. She had a spell on the  treadmill 2 weeks ago where she got short of breath and could not talk - she had been walking about 10 minutes - this lasted for 5 minutes - but she was able to proceed and finish her work out. This has not recurred and [facility] No chest pain with this spell and no recurrent chest/jaw pain since we changed her medicines. She remains more limited by her nerve pain. She has continued to have GI issues - has not had repeat colonoscopy since she was 51.    Past Medical History:  Diagnosis Date  . AMI (acute myocardial infarction) (HCC) 2007   failed PCI to the distal LAD - managed medically  . Chronic pain   . CVA (cerebral vascular accident) (HCC) 1999  . Degenerative disc disease, cervical   . Hypothyroidism   . Unstable angina [facility])     Past Surgical History:  Procedure Laterality Date  . ENDOMETRIAL ABLATION  3/06  . left ankle surgery       Medications: Current Meds  Medication Sig  . ALPRAZolam (XANAX) 0.5 MG tablet Take 0.5 mg by mouth 2 (two) times daily as needed.    Marland Kitchen aspirin 81 MG tablet Take 81 mg by mouth daily.  . cetirizine (ZYRTEC) 10 MG tablet Take 10 mg by mouth daily.    . Cholecalciferol (VITAMIN D3) 1000 units CAPS Take 1,000  Units by mouth daily.  . clopidogrel (PLAVIX) 75 MG tablet Take 1 tablet (75 mg total) by mouth daily.  . DENTA 5000 PLUS 1.1 % CREA dental cream Place 1 application onto teeth at bedtime.   . diphenhydrAMINE (BENADRYL) 25 MG tablet Take 25 mg by mouth every 6 (six) hours as needed.    . DULoxetine (CYMBALTA) 60 MG capsule Take 1 capsule (60 mg total) by mouth daily.  Marland Kitchen. FLUoxetine (PROZAC) 20 MG capsule Take 20 mg by mouth daily.    Marland Kitchen. gabapentin (NEURONTIN) 600 MG tablet Take 600 mg by mouth 2 (two) times daily.   . hydrocortisone (ANUSOL-HC) 2.5 % rectal cream Place 1 application rectally 2 (two) times daily.  Marland Kitchen. HYDROCORTISONE ACE, RECTAL, 30 MG SUPP Place 1 suppository rectally as needed.   Marland Kitchen.  levothyroxine (SYNTHROID, LEVOTHROID) 75 MCG tablet Take 75 mcg by mouth daily before breakfast.  . naloxegol oxalate (MOVANTIK) 25 MG TABS tablet Take 1 tablet (25 mg total) by mouth daily. Take one hour before food or two hours after.  . nitroGLYCERIN (NITROSTAT) 0.4 MG SL tablet Place 1 tablet (0.4 mg total) under the tongue every 5 (five) minutes as needed.  [name]. NUCYNTA ER 50 MG 12 hr tablet TAKE 1 TABLET TWO TIMES DAILY FOR 30 DAYS  . OnabotulinumtoxinA, Cosmetic, (BOTOX COSMETIC IM) Inject 500 mg into the muscle every 3 (three) months.  . pantoprazole (PROTONIX) 40 MG tablet Take 40 mg by mouth as needed.   . polyethylene glycol powder (GLYCOLAX/MIRALAX) powder MIX 17GM IN 8 OZ. OF WATER DAILY  . polyethylene glycol-electrolytes (TRILYTE) 420 g solution Take 4,000 mLs by mouth as directed.  . ranolazine (RANEXA) 500 MG 12 hr tablet Take 1 tablet (500 mg total) by mouth daily.  Marland Kitchen. rOPINIRole (REQUIP) 0.5 MG tablet Take 2 mg by mouth Daily.   . simvastatin (ZOCOR) 40 MG tablet Take 20 mg by mouth at bedtime.       Allergies: Allergies  Allergen Reactions  . Morphine   . Penicillins   . Zolpidem Tartrate     Social History: The patient  reports that she has quit smoking. She has never used smokeless tobacco. She reports that she drinks alcohol. She reports that she does not use drugs.   Family History: The patient's family history includes Arrhythmia in her father.   Review of Systems: Please see the history of present illness.   Otherwise, the review of systems is positive for none.   All other systems are reviewed and negative.   Physical Exam: VS:  BP 120/80 (BP Location: Left Arm, Patient Position: Sitting, Cuff Size: Normal)   Ht 5\' 2"  (1.575 m)   Wt 133 lb 12.8 oz (60.7 kg)   BMI 24.47 kg/m  .  BMI Body mass index is 24.47 kg/m.  Wt Readings from Last 3 Encounters:  04/19/18 133 lb 12.8 oz (60.7 kg)  02/14/18 136 lb 12.8 oz (62.1 kg)  02/02/18 133 lb 12.8 oz (60.7 kg)      General: Pleasant. Well developed, well nourished and in no acute distress. She has chronic left sided weakness.   HEENT: Normal.  Neck: Supple, no JVD, carotid bruits, or masses noted.  Cardiac: Regular rate and rhythm. No murmurs, rubs, or gallops. No edema.  Respiratory:  Lungs are clear to auscultation bilaterally with normal work of breathing.  GI: Soft and nontender.  MS: No deformity or atrophy. Gait and ROM intact. She does have left sided weakness.  Skin: Warm  and dry. Color is normal.  Neuro:  Strength and sensation are intact and no gross focal deficits noted.  Psych: Alert, appropriate and with normal affect.   LABORATORY DATA:  EKG:  EKG is not ordered today.  Lab Results  Component Value Date   WBC 7.7 11/17/2017   HGB 13.9 11/17/2017   HCT 41.0 11/17/2017   PLT 307 11/17/2017   GLUCOSE 104 (H) 11/17/2017   CHOL 164 09/10/2009   TRIG 111.0 09/10/2009   HDL 55.70 09/10/2009   LDLCALC 86 09/10/2009   ALT 13 11/17/2017   AST 17 11/17/2017   NA 143 11/17/2017   K 4.6 11/17/2017   CL 104 11/17/2017   CREATININE 0.70 11/17/2017   BUN 16 11/17/2017   CO2 26 11/17/2017   TSH 0.940 11/17/2017       BNP (last 3 results) No results for input(s): BNP in the last 8760 hours.  ProBNP (last 3 results) No results for input(s): PROBNP in the last 8760 hours.   Other Studies Reviewed Today:  MyoviewStudy Result1/2017   No diagnostic ST segment changes to indicate ischemia.  Small, mild intensity, lateral apical region of ischemia in the setting of artifact from extracardiac radiotracer uptake and also arms down imaging. No large ischemic territories.  This is a low risk study.  Nuclear stress EF: 74%.     Echo Study Conclusions from 2011 Left ventricle: The cavity size was normal. Systolic function was normal. The estimated ejection fraction was in the range of 55% to 60%. Wall motion was normal; there were no regional wall  motion abnormalities.    CARDIAC CATHETERIZATION 2007  HEMODYNAMICS:  1. Left main coronary artery: The left main coronary artery is free of  significant disease.  2. Left anterior descending artery: The left anterior descending artery  gave rise to two diagonal branches and a septal perforator. The LAD was  quite tortuous in its mid portion and then had a 99% occlusion in its  distal portion with TIMI-I flow.  3. Circumflex artery: The circumflex artery gave rise to an atrial branch,  marginal branch, and two posterolateral branches. These vessels were  free of significant disease.  4. Right coronary artery: The right coronary artery is a moderate size  vessel which gave rise to a clonus branch, right ventricular branch,  posterior descending branch, and a posterolateral branch. This vessel  was free of significant disease.  LEFT VENTRICULOGRAM: The left ventriculogram was obtained in the RAO  projection showed akinesis of the tip of the apex in the inferoapical  segment. The overall wall motion was good with an estimated ejection  fraction of 60%.  Following attempt at PCA there was persistent 99% stenosis in the distal LAD  with TIMI-I flow.  CONCLUSION:  1. Coronary artery disease, status post recent non-ST-elevation myocardial  infarction with 99% stenosis in the distal left anterior descending with  TIMI-I flow, no significant obstruction in the circumflex and right  coronary arteries, and inferoapical wall akinesis.  2. Unsuccessful percutaneous coronary intervention of the lesion in the  distal left anterior descending due to inability to cross with the wire.  DISPOSITION: The patient returned to the postanesthesia care unit for  further observation. I think her outlook should be good on medical therapy.  She has no significant residual coronary disease and overall LV function is  good.  ______________________________  Charlies Constable,  M.D. [facility]  BB/MEDQ D: 08/25/2005 T: 08/25/2005 Job: [id number]   Assessment / Plan:  1.  CAD - remote NSTEMI - with unsuccessful PCI to the distal LADback in 2007and no significant residual CAD at that time - she has been managed medically since that time. She is on chronic DAPT. She was previously using prn Ranexa this has been changed now to every day. She has not wished to proceed with repeat cath due to risk of stroke - her symptoms of chest/jaw pain are now resolved. She is no longer using NTG. She is exercising. I do not know what to make of the spell of shortness of breath she had 2 weeks ago - fortunately, this has not recurred. She wishes to proceed with medical therapy.  She may have her colonoscopy.   2. HLD - I had to cut her dose back of statin - we are rechecking lab today - may need to change to more intense agent.   3. Prior stroke - previously on coumadin and has been on Aggrenox - now on chronic Plavix and aspirin due to her CAD.She is limited by residual nerve pain.She has chronic left sided weakness.  4. Chronic nerve pain - on Botox and narcotic therapy.She still does amazingly well.   5. Need for colonoscopy - has had some recent GI issues - no further chest pain. She is seeing GI next month and will get arranged. If she is doing well from our standpoint, she may proceed.   Current medicines are reviewed with the patient today.  The patient does not have concerns regarding medicines other than what has been noted above.  The following changes have been made:  See above.  Labs/ tests ordered today include:    Orders Placed This Encounter  Procedures  . Basic metabolic panel  . Hepatic function panel  . Lipid panel     Disposition:   FU with me in 3 months.   Patient is agreeable to this plan and will call if any problems develop in the interim.   SignedNorma Fredrickson, NP  04/19/2018 10:23 AM  [facility] [address] [place], [place]  [address] Phone: [phone] Fax: [phone]

## 2018-04-19 ENCOUNTER — Ambulatory Visit: Payer: Medicare HMO | Admitting: Nurse Practitioner

## 2018-04-19 ENCOUNTER — Encounter: Payer: Self-pay | Admitting: Nurse Practitioner

## 2018-04-19 VITALS — BP 120/80 | Ht 62.0 in | Wt 133.8 lb

## 2018-04-19 DIAGNOSIS — E7800 Pure hypercholesterolemia, unspecified: Secondary | ICD-10-CM | POA: Diagnosis not present

## 2018-04-19 DIAGNOSIS — R079 Chest pain, unspecified: Secondary | ICD-10-CM | POA: Diagnosis not present

## 2018-04-19 DIAGNOSIS — I259 Chronic ischemic heart disease, unspecified: Secondary | ICD-10-CM

## 2018-04-19 LAB — LIPID PANEL
Chol/HDL Ratio: 2.9 ratio (ref 0.0–4.4)
Cholesterol, Total: 171 mg/dL (ref 100–199)
HDL: 59 mg/dL (ref >39)
LDL Calculated: 96 mg/dL (ref 0–99)
Triglycerides: 78 mg/dL (ref 0–149)
VLDL Cholesterol Cal: 16 mg/dL (ref 5–40)

## 2018-04-19 LAB — BASIC METABOLIC PANEL
BUN/Creatinine Ratio: 14 (ref 12–28)
BUN: 12 mg/dL (ref 8–27)
CO2: 26 mmol/L (ref 20–29)
Calcium: 9.3 mg/dL (ref 8.7–10.3)
Chloride: 99 mmol/L (ref 96–106)
Creatinine, Ser: 0.83 mg/dL (ref 0.57–1.00)
GFR calc Af Amer: 85 mL/min/{1.73_m2} (ref >59)
GFR calc non Af Amer: 74 mL/min/{1.73_m2} (ref >59)
Glucose: 97 mg/dL (ref 65–99)
Potassium: 4.5 mmol/L (ref 3.5–5.2)
Sodium: 138 mmol/L (ref 134–144)

## 2018-04-19 LAB — HEPATIC FUNCTION PANEL
ALT: 11 IU/L (ref 0–32)
AST: 17 IU/L (ref 0–40)
Albumin: 4 g/dL (ref 3.6–4.8)
Alkaline Phosphatase: 51 IU/L (ref 39–117)
Bilirubin Total: 0.4 mg/dL (ref 0.0–1.2)
Bilirubin, Direct: 0.12 mg/dL (ref 0.00–0.40)
Total Protein: 6.5 g/dL (ref 6.0–8.5)

## 2018-04-19 NOTE — Patient Instructions (Addendum)
We will be checking the following labs today - BMET, lipids and HPF   Medication Instructions:    Continue with your current medicines.     Testing/Procedures To Be Arranged:  N/A  Follow-Up:   See me in 3 months.     Other Special Instructions:   N/A    If you need a refill on your cardiac medications before your next appointment, please call your pharmacy.   Call the [facility] office at [phone] if you have any questions, problems or concerns.      

## 2018-04-20 ENCOUNTER — Telehealth: Payer: Self-pay | Admitting: Nurse Practitioner

## 2018-04-20 NOTE — Progress Notes (Signed)
Left message on machine for pt to contact the office.   

## 2018-04-20 NOTE — Telephone Encounter (Signed)
New Message ° ° ° °Patient is returning call in reference to labs. Please call.  °

## 2018-04-26 ENCOUNTER — Encounter: Payer: Self-pay | Admitting: Gastroenterology

## 2018-05-04 ENCOUNTER — Ambulatory Visit: Payer: Medicare HMO | Admitting: Neurology

## 2018-05-04 ENCOUNTER — Telehealth: Payer: Self-pay | Admitting: Neurology

## 2018-05-04 NOTE — Telephone Encounter (Signed)
Called and scheduled delivery for the patient's medication.    Candise [facility] - [phone] 500 units  approval number [id number] 08/01/2017 -08/02/2018  G81.14 [phone]

## 2018-05-06 ENCOUNTER — Ambulatory Visit: Payer: Medicare HMO | Admitting: Gastroenterology

## 2018-05-10 DIAGNOSIS — G890 Central pain syndrome: Secondary | ICD-10-CM | POA: Diagnosis not present

## 2018-05-10 DIAGNOSIS — Z79891 Long term (current) use of opiate analgesic: Secondary | ICD-10-CM | POA: Diagnosis not present

## 2018-05-10 DIAGNOSIS — M542 Cervicalgia: Secondary | ICD-10-CM | POA: Diagnosis not present

## 2018-05-10 DIAGNOSIS — G894 Chronic pain syndrome: Secondary | ICD-10-CM | POA: Diagnosis not present

## 2018-05-10 DIAGNOSIS — Z79899 Other long term (current) drug therapy: Secondary | ICD-10-CM | POA: Diagnosis not present

## 2018-05-11 ENCOUNTER — Ambulatory Visit: Payer: Medicare HMO | Admitting: Neurology

## 2018-05-11 ENCOUNTER — Encounter: Payer: Self-pay | Admitting: Neurology

## 2018-05-11 VITALS — BP 127/81 | HR 82 | Ht 62.0 in | Wt 135.0 lb

## 2018-05-11 DIAGNOSIS — I69354 Hemiplegia and hemiparesis following cerebral infarction affecting left non-dominant side: Secondary | ICD-10-CM

## 2018-05-11 NOTE — Progress Notes (Signed)
[facility]  CC:  Spastic hemiplegia  [name] is a 67  y.o. Right-handed Caucasian female, return for EMG guided Botox injection for her left spastic hemiparesis,  She suffered stroke in 1999, with residual left spastic hemiparesis, upper extremity more than leg, left visual field cut  She previously was enrolled in Botox research study for spastic upper extremity at [facility] by Dr. Marya Fossa, received EMG guided Botox injection in 2010 for about 2 years, with good benefit, but has difficulty with insurance  She began to receive Botox injection through our clinic by Dr. Hosie Poisson since May 2015,150 units total to left upper extremity, which has been very helpful  She is return for continued EMG guided Botox injection, at baseline, she can ambulate with a left AFO, but without assistance,  Profound left upper extremity spasticity, achiness, chronic pain, persistent left wrist flexion, finger flexion,  She also has a history of coronary artery disease in 2007, taking Plavix, and Aggrenox, exercise regularly, has a personal trainer  UPDATE March 2nd 2016: She received 300 units in Jun 13 2014, to left upper extremity, which has been very helpful, she did not notice significant side effect. The injection help her open her left hand better, no significant side effect noticed.  UPDATE June 8th 2016: She received 300 units of Botox A in March second 2016, responded very well, she was able to relax her left hand better, she ambulate without assistance with spastic left hemiparesis, complains of left facial, left foot pain, wants the injection to emphasize on her left finger flexion wrist flexion today.  UPDATE Sep 28th 2016: She responded to previous Botox injection very well in June, she wants to emphasize on her left upper extremity at this time, complains forceful finger contraction, flexion, elbow flexion. Only mild left shoulder pain with passive  movement.  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 Patient is concerned about potential side effect of weakness.  Update May 21 2016: She responded very well to previous injection in July, only wants to receive injection for spastic left upper extremity, she had a pair of left wrist/finger splint,  Update September 09 2016: She is now using a left wrist splint, which has been helpful,  UPDATE Dec 10 2016: She responded well to previous injection no significant side effect noticed,   UPDATE Apr 14 2017: She wear left wrist pain, denies significant side effect, complains of left-sided neuropathic pain, despite Prozac 20 mg, Nucynta 50 mg, gabapentin 600 mg twice a day,  UPDATE Jul 14 2017: She responded very well to previous injection in September 2018,  UPDATE October 13 2017: She responded very well to previous Botox injection  UPDATE January 26 2018: She complains of left leg weakness was injection, wants to focus injection on left upper extremity,  UPDATE May 11 2018: She responded well to previous injection   Social History   Socioeconomic History  . Marital status:  Married    Spouse name: Not on file  . Number of children: Not on file  . Years of education: Not on file  . Highest education level: Not on file  Occupational History  . Occupation: disabled   Social Needs  . Financial resource strain: Not on file  . Food insecurity:    Worry: Not on file    Inability: Not on file  . Transportation needs:    Medical: Not on file    Non-medical: Not on file  Tobacco Use  . Smoking status: Former Games developer  . Smokeless tobacco: Never Used  . Tobacco comment: Quit in 2005  Substance and Sexual Activity  . Alcohol use: Yes    Comment: social  . Drug use: No  . Sexual activity: Not Currently  Lifestyle  . Physical activity:    Days per week: Not on file    Minutes per session: Not on file  . Stress: Not on file  Relationships  . Social connections:    Talks on phone: Not on file    Gets together: Not on file    Attends religious service: Not on file    Active member of club or organization: Not on file    Attends meetings of clubs or organizations: Not on file    Relationship status: Not on file  . Intimate partner violence:    Fear of current or ex partner: Not on file    Emotionally abused: Not on file    Physically abused: Not on file    Forced sexual activity: Not on file  Other Topics Concern  . Not on file  Social History Narrative   Married, no children   Right handed   12th grade   1 cup daily    Family History  Problem Relation Age of Onset  . Arrhythmia Father        PPM  . Colon cancer Neg Hx     Past Medical History:  Diagnosis Date  . AMI (acute myocardial infarction) (HCC) 2007   failed PCI to the distal LAD - managed medically  . Chronic pain   . CVA (cerebral vascular accident) (HCC) 1999  . Degenerative disc disease, cervical   . Hypothyroidism   . Unstable angina [facility])     Past Surgical History:  Procedure Laterality Date  . ENDOMETRIAL ABLATION  3/06  . left ankle surgery      Current Outpatient Medications  Medication Sig Dispense Refill  . ALPRAZolam (XANAX) 0.5 MG tablet Take 0.5 mg by mouth 2 (two) times daily as needed.      Marland Kitchen aspirin 81 MG tablet Take 81 mg by mouth daily.    .  cetirizine (ZYRTEC) 10 MG tablet Take 10 mg by mouth daily.      . Cholecalciferol (VITAMIN D3) 1000 units CAPS Take 1,000 Units by mouth daily.    . clopidogrel (PLAVIX) 75 MG tablet Take 1 tablet (75 mg total) by mouth daily. 90 tablet 3  . DENTA 5000 PLUS 1.1 % CREA dental cream Place 1 application onto teeth at bedtime.   4  . diphenhydrAMINE (BENADRYL) 25 MG tablet Take 25 mg by mouth every 6 (six) hours as needed.      . DULoxetine (CYMBALTA) 60 MG capsule Take 1 capsule (60 mg total) by mouth daily. 30 capsule 11  . FLUoxetine (PROZAC) 20 MG capsule Take 20 mg by mouth daily.      Marland Kitchen gabapentin (NEURONTIN) 600 MG tablet Take  600 mg by mouth 2 (two) times daily.     . hydrocortisone (ANUSOL-HC) 2.5 % rectal cream Place 1 application rectally 2 (two) times daily. 30 g 0  . HYDROCORTISONE ACE, RECTAL, 30 MG SUPP Place 1 suppository rectally as needed.     Marland Kitchen levothyroxine (SYNTHROID, LEVOTHROID) 75 MCG tablet Take 75 mcg by mouth daily before breakfast.    . naloxegol oxalate (MOVANTIK) 25 MG TABS tablet Take 1 tablet (25 mg total) by mouth daily. Take one hour before food or two hours after. 10 tablet 0  . nitroGLYCERIN (NITROSTAT) 0.4 MG SL tablet Place 1 tablet (0.4 mg total) under the tongue every 5 (five) minutes as needed. 25 tablet 3  . NUCYNTA ER 50 MG 12 hr tablet TAKE 1 TABLET TWO TIMES DAILY FOR 30 DAYS  0  . OnabotulinumtoxinA, Cosmetic, (BOTOX COSMETIC IM) Inject 500 mg into the muscle every 3 (three) months.    . pantoprazole (PROTONIX) 40 MG tablet Take 40 mg by mouth as needed.     . polyethylene glycol powder (GLYCOLAX/MIRALAX) powder MIX 17GM IN 8 OZ. OF WATER DAILY  6  . polyethylene glycol-electrolytes (TRILYTE) 420 g solution Take 4,000 mLs by mouth as directed. 4000 mL 0  . ranolazine (RANEXA) 500 MG 12 hr tablet Take 1 tablet (500 mg total) by mouth daily. 90 tablet 3  . rOPINIRole (REQUIP) 0.5 MG tablet Take 2 mg by mouth Daily.     . simvastatin (ZOCOR) 40 MG tablet  Take 20 mg by mouth at bedtime.      . fluticasone (FLONASE) 50 MCG/ACT nasal spray fluticasone 50 mcg/actuation nasal spray,suspension     No current facility-administered medications for this visit.     Allergies as of 05/11/2018 - Review Complete 05/11/2018  Allergen Reaction Noted  . Morphine  01/01/2009  . Penicillins  01/01/2009  . Zolpidem tartrate  01/01/2009    Vitals: BP 127/81   Pulse 82   Ht 5\' 2"  (1.575 m)   Wt 135 lb (61.2 kg)   BMI 24.69 kg/m  Last Weight:  Wt Readings from Last 1 Encounters:  05/11/18 135 lb (61.2 kg)   Last Height:   Ht Readings from Last 1 Encounters:  05/11/18 5\' 2"  (1.575 m)   PHYSICAL EXAMINATOINS:  Generalized: In no acute distress  Neck: Supple, no carotid bruits   Cardiac: Regular rate rhythm  Pulmonary: Clear to auscultation bilaterally  Musculoskeletal: No deformity  Neurological examination  Mentation: Alert oriented to time, place, history taking, and causual conversation, left lower face weakness  Cranial nerve II-XII: Pupils were equal round reactive to light extraocular movements were full, left visual field cut.   Left lower face weakness. hearing was intact to finger rubbing bilaterally. Uvula tongue midline.  head turning and shoulder shrug and were normal and symmetric.Tongue protrusion into cheek strength was normal.  Motor: spastic left hemiparesis,  Wearing left AFO, left lower extremity moderate spasticity, left hip flexion 4, knee flexion 4, extension 5, Profound left upper extremity spasticity, left shoulder adduction, internal rotation.left elbow flexion, pronation, with passive movement, mild fixed contraction of left elbow, maximum 175, left wrist forceful flexion, finger flexion,thumb in position, even with forceful movement, she could not achieve full extension of her left finger  Gait: Rising up from seated position by pushing on chair arm,spastic left hemi-circumferential gait.  Deep tendon reflexes:  hyperreflexia of left side   Assessment and plan:   67 years old right-handed Caucasian female, status post stroke, with  spastic left hemiparesis since 1999, responded very well to previous EMG guided Botox injection, return to clinic for repeat injection  Under electric stimulation, 500 units of Botox A was injected into left upper extremity muscles  Left pronator teres 25 x2 =50 units Left flexor carpi radialis 50 units Left  flexor carpi ulnaris 25 x2=50units Left flexor digitorum profundus 50 units  Left brachialis 100 units  Left palmaris longus 25 x2=50 units Left pectoralis major 100 units Left latissimus dorsi 50 units    [name], M.D. Ph.D.  [facility] [address] [place], [place] [address] Phone: [phone] Fax:      [phone]

## 2018-05-11 NOTE — Progress Notes (Signed)
**  Botox 100 units x 5 vials, NDC 1610-9604-54, Lot [id number], Exp 11/2020, specialty pharmacy.//mck,rn**

## 2018-05-13 ENCOUNTER — Ambulatory Visit: Payer: Medicare HMO | Admitting: Gastroenterology

## 2018-05-16 ENCOUNTER — Encounter: Payer: Self-pay | Admitting: Gastroenterology

## 2018-05-16 ENCOUNTER — Encounter (HOSPITAL_COMMUNITY): Payer: Self-pay

## 2018-05-16 ENCOUNTER — Ambulatory Visit: Payer: Medicare HMO | Admitting: Gastroenterology

## 2018-05-16 ENCOUNTER — Other Ambulatory Visit: Payer: Self-pay

## 2018-05-16 VITALS — BP 136/87 | HR 83 | Temp 97.2°F | Ht 62.0 in | Wt 134.6 lb

## 2018-05-16 DIAGNOSIS — K649 Unspecified hemorrhoids: Secondary | ICD-10-CM | POA: Diagnosis not present

## 2018-05-16 DIAGNOSIS — K5903 Drug induced constipation: Secondary | ICD-10-CM | POA: Diagnosis not present

## 2018-05-16 DIAGNOSIS — T402X5A Adverse effect of other opioids, initial encounter: Principal | ICD-10-CM

## 2018-05-16 DIAGNOSIS — R194 Change in bowel habit: Secondary | ICD-10-CM

## 2018-05-16 MED ORDER — HYDROCORTISONE ACETATE 30 MG RE SUPP: 1.0000 | Freq: Two times a day (BID) | RECTAL | 0 refills | Status: DC

## 2018-05-16 NOTE — Assessment & Plan Note (Signed)
Poorly managed at this time.  Will increase Linzess to 290 mcg daily.

## 2018-05-16 NOTE — Patient Instructions (Signed)
1. Colonoscopy as scheduled.  Please see separate instructions. 2. Prescription for hydrocortisone suppositories sent to [facility] drug. 3. Try samples of Linzess 290 mcg once daily on an empty stomach.  If you prefer to continue this dose as opposed to 145 mcg let me know and I will call in a prescription.

## 2018-05-16 NOTE — Assessment & Plan Note (Addendum)
Anusol suppositories twice daily for 2 weeks.  Patient has difficulty utilizing Anusol cream given no use of her left arm.  Hopefully there will be no issues getting coverage with her insurance.  Problems.  Patient will let me know if there is any problems.

## 2018-05-16 NOTE — Progress Notes (Signed)
Primary Care Physician:  Selinda Flavin, MD  Primary Gastroenterologist:  Jonette Eva, MD   Chief Complaint  Patient presents with  . Colonoscopy    consult  . Constipation  . Hemorrhoids    HPI:  [name] is a 67 y.o. female here to get her colonoscopy rescheduled.  She was seen back in April but we had to postpone her procedure because of some cardiac issues that needed to be addressed.  Patient had a colonoscopy at age 65 which was "normal" per patient.  Denies family history of colon cancer.  She has had problems with chronic constipation.  We saw her back in April she was having more issues with loose stools since starting Cymbalta.  Describes stool as greasy/fatty, difficult to flush, malodorous.  Having BMs every couple of days.  At the time she would use MiraLAX as needed if she has not had a bowel movement, utilizing Linzess rarely.  Was having some bright red blood per rectum felt to be related to hemorrhoids.  With note no problems eating fatty foods i.e. did not make her BMs worse.  Has been evaluated by Dr. Gabriel Cirri regarding hemorrhoids who did not recommend surgical intervention.  Since we last saw her she has had more issues with constipation.  Now taking Linzess every day. BM 3 per week. Sometimes cleaned out. Sometimes just a solid stool. miralax rarely. No brbpr. Hemorrhoids bother off/on. suppsitories bid for five days recently by PCP, helpful but needs additional days of therapy.  Of note after her last visit we tried Movantik for her constipation but she did not get good results.  No problems with upper GI symptoms.  Celiac screen negative.  Current Outpatient Medications  Medication Sig Dispense Refill  . ALPRAZolam (XANAX) 0.5 MG tablet Take 0.5 mg by mouth 2 (two) times daily as needed.      Marland Kitchen aspirin 81 MG tablet Take 81 mg by mouth daily.    . Calcium Carbonate-Vit D-Min (CALCIUM 1200 PO) Take by mouth daily.    . cetirizine (ZYRTEC) 10 MG tablet Take 10 mg  by mouth daily.      . Cholecalciferol (VITAMIN D3) 1000 units CAPS Take 1,000 Units by mouth daily.    . clopidogrel (PLAVIX) 75 MG tablet Take 1 tablet (75 mg total) by mouth daily. 90 tablet 3  . DENTA 5000 PLUS 1.1 % CREA dental cream Place 1 application onto teeth at bedtime.   4  . diphenhydrAMINE (BENADRYL) 25 MG tablet Take 25 mg by mouth every 6 (six) hours as needed.      . DULoxetine (CYMBALTA) 60 MG capsule Take 1 capsule (60 mg total) by mouth daily. 30 capsule 11  . FLUoxetine (PROZAC) 20 MG capsule Take 20 mg by mouth daily.      . fluticasone (FLONASE) 50 MCG/ACT nasal spray as needed.     . gabapentin (NEURONTIN) 600 MG tablet Take 600 mg by mouth 2 (two) times daily.     Marland Kitchen HYDROCORTISONE ACE, RECTAL, 30 MG SUPP Place 1 suppository rectally as needed.     . ibandronate (BONIVA) 150 MG tablet Take 150 mg by mouth every 30 (thirty) days. Take in the morning with a full glass of water, on an empty stomach, and do not take anything else by mouth or lie down for the next 30 min.    Marland Kitchen levothyroxine (SYNTHROID, LEVOTHROID) 75 MCG tablet Take 75 mcg by mouth daily before breakfast.    . linaclotide (LINZESS) 145  MCG CAPS capsule Take 145 mcg by mouth as needed. Tries to take everyday    . nitroGLYCERIN (NITROSTAT) 0.4 MG SL tablet Place 1 tablet (0.4 mg total) under the tongue every 5 (five) minutes as needed. 25 tablet 3  . NUCYNTA ER 50 MG 12 hr tablet TAKE 1 TABLET TWO TIMES DAILY FOR 30 DAYS  0  . OnabotulinumtoxinA, Cosmetic, (BOTOX COSMETIC IM) Inject 500 mg into the muscle every 3 (three) months.    . pantoprazole (PROTONIX) 40 MG tablet Take 40 mg by mouth as needed.     . polyethylene glycol powder (GLYCOLAX/MIRALAX) powder as needed.   6  . ranolazine (RANEXA) 500 MG 12 hr tablet Take 1 tablet (500 mg total) by mouth daily. 90 tablet 3  . rOPINIRole (REQUIP) 0.5 MG tablet Take 2 mg by mouth Daily.     . simvastatin (ZOCOR) 40 MG tablet Take 20 mg by mouth at bedtime.       No  current facility-administered medications for this visit.     Allergies as of 05/16/2018 - Review Complete 05/16/2018  Allergen Reaction Noted  . Morphine  01/01/2009  . Penicillins  01/01/2009  . Zolpidem tartrate  01/01/2009    Past Medical History:  Diagnosis Date  . AMI (acute myocardial infarction) (HCC) 2007   failed PCI to the distal LAD - managed medically  . Chronic pain   . CVA (cerebral vascular accident) (HCC) 1999  . Degenerative disc disease, cervical   . Hypothyroidism   . Unstable angina [facility])     Past Surgical History:  Procedure Laterality Date  . ENDOMETRIAL ABLATION  3/06  . left ankle surgery      Family History  Problem Relation Age of Onset  . Arrhythmia Father        PPM  . Colon cancer Neg Hx     Social History   Socioeconomic History  . Marital status: Married    Spouse name: Not on file  . Number of children: Not on file  . Years of education: Not on file  . Highest education level: Not on file  Occupational History  . Occupation: disabled  Social Needs  . Financial resource strain: Not on file  . Food insecurity:    Worry: Not on file    Inability: Not on file  . Transportation needs:    Medical: Not on file    Non-medical: Not on file  Tobacco Use  . Smoking status: Former Games developer  . Smokeless tobacco: Never Used  . Tobacco comment: Quit in 2005  Substance and Sexual Activity  . Alcohol use: Yes    Comment: social  . Drug use: No  . Sexual activity: Not Currently  Lifestyle  . Physical activity:    Days per week: Not on file    Minutes per session: Not on file  . Stress: Not on file  Relationships  . Social connections:    Talks on phone: Not on file    Gets together: Not on file    Attends religious service: Not on file    Active member of club or organization: Not on file    Attends meetings of clubs or organizations: Not on file    Relationship status: Not on file  . Intimate partner violence:    Fear of current  or ex partner: Not on file    Emotionally abused: Not on file    Physically abused: Not on file    Forced sexual activity:  Not on file  Other Topics Concern  . Not on file  Social History Narrative   Married, no children   Right handed   12th grade   1 cup daily      ROS:  General: Negative for anorexia, weight loss, fever, chills, fatigue, weakness.  Chronic left-sided weakness Eyes: Negative for vision changes.  ENT: Negative for hoarseness, difficulty swallowing , nasal congestion. CV: Negative for chest pain, angina, palpitations, dyspnea on exertion, peripheral edema.  Respiratory: Negative for dyspnea at rest, dyspnea on exertion, cough, sputum, wheezing.  GI: See history of present illness. GU:  Negative for dysuria, hematuria, urinary incontinence, urinary frequency, nocturnal urination.  MS: Negative for joint pain, low back pain.  Derm: Negative for rash or itching.  Neuro: Negative for   seizure, frequent headaches, memory loss, confusion.  Psych: Negative for anxiety, depression, suicidal ideation, hallucinations.  Endo: Negative for unusual weight change.  Heme: Negative for bruising or bleeding. Allergy: Negative for rash or hives.    Physical Examination:  BP 136/87   Pulse 83   Temp (!) 97.2 F (36.2 C) (Oral)   Ht 5\' 2"  (1.575 m)   Wt 134 lb 9.6 oz (61.1 kg)   BMI 24.62 kg/m    General: Well-nourished, well-developed in no acute distress.  Head: Normocephalic, atraumatic.   Eyes: Conjunctiva pink, no icterus. Mouth: Oropharyngeal mucosa moist and pink , no lesions erythema or exudate. Neck: Supple without thyromegaly, masses, or lymphadenopathy.  Lungs: Clear to auscultation bilaterally.  Heart: Regular rate and rhythm, no murmurs rubs or gallops.  Abdomen: Bowel sounds are normal, nontender, nondistended, no hepatosplenomegaly or masses, no abdominal bruits or    hernia , no rebound or guarding.   Rectal: Not performed Extremities: No lower  extremity edema. No clubbing or deformities.  Neuro: Alert and oriented x 4 , left-sided weakness with limited use of left upper extremity, ambulates with a limp Skin: Warm and dry, no rash or jaundice.   Psych: Alert and cooperative, normal mood and affect.  Labs: Lab Results  Component Value Date   CREATININE 0.83 04/19/2018   BUN 12 04/19/2018   NA 138 04/19/2018   K 4.5 04/19/2018   CL 99 04/19/2018   CO2 26 04/19/2018   Lab Results  Component Value Date   ALT 11 04/19/2018   AST 17 04/19/2018   ALKPHOS 51 04/19/2018   BILITOT 0.4 04/19/2018   Lab Results  Component Value Date   WBC 7.7 11/17/2017   HGB 13.9 11/17/2017   HCT 41.0 11/17/2017   MCV 96 11/17/2017   PLT 307 11/17/2017   Lab Results  Component Value Date   TSH 0.940 11/17/2017     Imaging Studies: No results found.

## 2018-05-16 NOTE — Assessment & Plan Note (Addendum)
Initial plan for colonoscopy several months back when she had a change in bowel habits from chronic constipation to more frequent loose stools. In additional it has been greater than 10 years since her last colonoscopy.  Colonoscopy was postponed due to need for cardiac evaluation.  She now has cardiac clearance.    She is having more of an issue with constipation at this time.  Describes fatty stools but no documented weight loss or malabsorption issues.  We will go ahead and pursue colonoscopy at this time. Plan for deep sedation given polypharmacy.  I have discussed the risks, alternatives, benefits with regards to but not limited to the risk of reaction to medication, bleeding, infection, perforation and the patient is agreeable to proceed. Written consent to be obtained.

## 2018-05-17 ENCOUNTER — Encounter (HOSPITAL_COMMUNITY)
Admission: RE | Admit: 2018-05-17 | Discharge: 2018-05-17 | Disposition: A | Discharge status: Home / Self Care | Payer: Medicare HMO | Source: Ambulatory Visit | Attending: Gastroenterology | Admitting: Gastroenterology

## 2018-05-17 NOTE — Progress Notes (Signed)
cc'ed to pcp °

## 2018-05-24 ENCOUNTER — Ambulatory Visit (HOSPITAL_COMMUNITY)
Admission: RE | Admit: 2018-05-24 | Discharge: 2018-05-24 | Disposition: A | Discharge status: Home / Self Care | Payer: Medicare HMO | Source: Ambulatory Visit | Attending: Gastroenterology | Admitting: Gastroenterology

## 2018-05-24 ENCOUNTER — Encounter (HOSPITAL_COMMUNITY): Payer: Self-pay | Admitting: *Deleted

## 2018-05-24 ENCOUNTER — Encounter (HOSPITAL_COMMUNITY)
Admission: RE | Disposition: A | Discharge status: Home / Self Care | Payer: Self-pay | Source: Ambulatory Visit | Attending: Gastroenterology

## 2018-05-24 ENCOUNTER — Ambulatory Visit (HOSPITAL_COMMUNITY): Payer: Medicare HMO | Admitting: Anesthesiology

## 2018-05-24 DIAGNOSIS — Q438 Other specified congenital malformations of intestine: Secondary | ICD-10-CM | POA: Insufficient documentation

## 2018-05-24 DIAGNOSIS — Z888 Allergy status to other drugs, medicaments and biological substances status: Secondary | ICD-10-CM | POA: Diagnosis not present

## 2018-05-24 DIAGNOSIS — M1990 Unspecified osteoarthritis, unspecified site: Secondary | ICD-10-CM | POA: Insufficient documentation

## 2018-05-24 DIAGNOSIS — Z7902 Long term (current) use of antithrombotics/antiplatelets: Secondary | ICD-10-CM | POA: Diagnosis not present

## 2018-05-24 DIAGNOSIS — Z87891 Personal history of nicotine dependence: Secondary | ICD-10-CM | POA: Diagnosis not present

## 2018-05-24 DIAGNOSIS — Z885 Allergy status to narcotic agent status: Secondary | ICD-10-CM | POA: Insufficient documentation

## 2018-05-24 DIAGNOSIS — R194 Change in bowel habit: Secondary | ICD-10-CM

## 2018-05-24 DIAGNOSIS — I252 Old myocardial infarction: Secondary | ICD-10-CM | POA: Diagnosis not present

## 2018-05-24 DIAGNOSIS — Z880 Allergy status to penicillin: Secondary | ICD-10-CM | POA: Insufficient documentation

## 2018-05-24 DIAGNOSIS — G8929 Other chronic pain: Secondary | ICD-10-CM | POA: Diagnosis not present

## 2018-05-24 DIAGNOSIS — T402X5A Adverse effect of other opioids, initial encounter: Secondary | ICD-10-CM

## 2018-05-24 DIAGNOSIS — K648 Other hemorrhoids: Secondary | ICD-10-CM | POA: Diagnosis not present

## 2018-05-24 DIAGNOSIS — I2510 Atherosclerotic heart disease of native coronary artery without angina pectoris: Secondary | ICD-10-CM | POA: Diagnosis not present

## 2018-05-24 DIAGNOSIS — Z79899 Other long term (current) drug therapy: Secondary | ICD-10-CM | POA: Insufficient documentation

## 2018-05-24 DIAGNOSIS — K644 Residual hemorrhoidal skin tags: Secondary | ICD-10-CM | POA: Diagnosis not present

## 2018-05-24 DIAGNOSIS — E039 Hypothyroidism, unspecified: Secondary | ICD-10-CM | POA: Insufficient documentation

## 2018-05-24 DIAGNOSIS — K5903 Drug induced constipation: Secondary | ICD-10-CM

## 2018-05-24 DIAGNOSIS — I69354 Hemiplegia and hemiparesis following cerebral infarction affecting left non-dominant side: Secondary | ICD-10-CM | POA: Insufficient documentation

## 2018-05-24 DIAGNOSIS — K649 Unspecified hemorrhoids: Secondary | ICD-10-CM

## 2018-05-24 HISTORY — DX: Other complications of anesthesia, initial encounter: T88.59XA

## 2018-05-24 HISTORY — DX: Other specified postprocedural states: Z98.890

## 2018-05-24 HISTORY — PX: COLONOSCOPY WITH PROPOFOL: SHX5780

## 2018-05-24 HISTORY — DX: Other specified postprocedural states: R11.2

## 2018-05-24 HISTORY — DX: Adverse effect of unspecified anesthetic, initial encounter: T41.45XA

## 2018-05-24 HISTORY — DX: Nausea with vomiting, unspecified: R11.2

## 2018-05-24 SURGERY — COLONOSCOPY WITH PROPOFOL
Anesthesia: General

## 2018-05-24 MED ORDER — ONDANSETRON HCL 4 MG/2ML IJ SOLN
INTRAMUSCULAR | Status: AC
  Filled 2018-05-24: qty 2

## 2018-05-24 MED ORDER — CHLORHEXIDINE GLUCONATE CLOTH 2 % EX PADS: 6.0000 | MEDICATED_PAD | Freq: Once | CUTANEOUS | Status: DC

## 2018-05-24 MED ORDER — PROPOFOL 500 MG/50ML IV EMUL
INTRAVENOUS | Status: DC | PRN
  Administered 2018-05-24: 125 ug/kg/min via INTRAVENOUS

## 2018-05-24 MED ORDER — MIDAZOLAM HCL 5 MG/5ML IJ SOLN
INTRAMUSCULAR | Status: DC | PRN
  Administered 2018-05-24: 2 mg via INTRAVENOUS

## 2018-05-24 MED ORDER — LACTATED RINGERS IV SOLN
INTRAVENOUS | Status: DC
  Administered 2018-05-24: 12:00:00 via INTRAVENOUS

## 2018-05-24 MED ORDER — PROMETHAZINE HCL 25 MG/ML IJ SOLN: 6.2500 mg | INTRAMUSCULAR | Status: DC | PRN

## 2018-05-24 MED ORDER — MIDAZOLAM HCL 2 MG/2ML IJ SOLN
INTRAMUSCULAR | Status: AC
  Filled 2018-05-24: qty 2

## 2018-05-24 MED ORDER — MIDAZOLAM HCL 2 MG/2ML IJ SOLN: 0.5000 mg | Freq: Once | INTRAMUSCULAR | Status: DC | PRN

## 2018-05-24 NOTE — Op Note (Signed)
[facility] Patient Name: [name] Procedure Date: 05/24/2018 11:47 AM MRN: [id number] Date of Birth: 08-26-1950 Attending MD: Jonette Eva MD, MD CSN: [id number] Age: 67 Admit Type: Outpatient Procedure:                Colonoscopy, DIAG Indications:              Change in bowel habits Providers:                Jonette Eva MD, MD, Jannett Celestine, RN, Dyann Ruddle Referring MD:             Selinda Flavin, MD Medicines:                Propofol per Anesthesia Complications:            No immediate complications. Estimated Blood Loss:     Estimated blood loss: none. Procedure:                Pre-Anesthesia Assessment:                           - Prior to the procedure, a History and Physical                            was performed, and patient medications and                            allergies were reviewed. The patient's tolerance of                            previous anesthesia was also reviewed. The risks                            and benefits of the procedure and the sedation                            options and risks were discussed with the patient.                            All questions were answered, and informed consent                            was obtained. Prior Anticoagulants: The patient has                            taken aspirin. ASA Grade Assessment: II - A patient                            with mild systemic disease. After reviewing the                            risks and benefits, the patient was deemed in                            satisfactory condition to undergo the procedure.  After obtaining informed consent, the colonoscope                            was passed under direct vision. Throughout the                            procedure, the patient's blood pressure, pulse, and                            oxygen saturations were monitored continuously. The                            CF-[id number] ([id number]) scope was introduced  through                            the anus and advanced to the 5 cm into the ileum.                            The terminal ileum, ileocecal valve, appendiceal                            orifice, and rectum were photographed. The                            colonoscopy was technically difficult and complex                            due to a tortuous colon. Successful completion of                            the procedure was aided by straightening and                            shortening the scope to obtain bowel loop reduction                            and COLOWRAP. The quality of the bowel preparation                            was good. The patient tolerated the procedure well. Scope In: 12:36:14 PM Scope Out: 1:01:59 PM Scope Withdrawal Time: 0 hours 18 minutes 9 seconds  Total Procedure Duration: 0 hours 25 minutes 45 seconds  Findings:      The terminal ileum appeared normal.      The recto-sigmoid colon, sigmoid colon, descending colon and splenic       flexure revealed significantly excessive looping.      External hemorrhoids were found. The hemorrhoids were small. Impression:               - The examined portion of the ileum was normal.                           - There was significant looping of the colon.                           -  External hemorrhoids.                           -CHANGE IN BOWEL HABITS/CONSTIPATION DUE TO                            MEDS/TORTUOUS COLON Moderate Sedation:      Per Anesthesia Care Recommendation:           - Patient has a contact number available for                            emergencies. The signs and symptoms of potential                            delayed complications were discussed with the                            patient. Return to normal activities tomorrow.                            Written discharge instructions were provided to the                            patient.                           - High fiber diet.                            - Continue present medications.                           - Repeat colonoscopy is not recommended due to                            current age (60 years or older) for surveillance.                           - Return to my office in 4 months. Procedure Code(s):        --- Professional ---                           [phone], Colonoscopy, flexible; diagnostic, including                            collection of specimen(s) by brushing or washing,                            when performed (separate procedure) Diagnosis Code(s):        --- Professional ---                           K64.4, Residual hemorrhoidal skin tags                           R19.4, Change in bowel habit  CPT copyright 2018 American Medical Association. All rights reserved. The codes documented in this report are preliminary and upon coder review may  be revised to meet current compliance requirements. Jonette Eva, MD Jonette Eva MD, MD 05/24/2018 1:15:43 PM This report has been signed electronically. Number of Addenda: 0

## 2018-05-24 NOTE — Anesthesia Postprocedure Evaluation (Signed)
Anesthesia Post Note  Patient: [name]  Procedure(s) Performed: COLONOSCOPY WITH PROPOFOL (N/A )  Patient location during evaluation: PACU Anesthesia Type: MAC Level of consciousness: awake and alert Pain management: pain level controlled Vital Signs Assessment: post-procedure vital signs reviewed and stable Respiratory status: spontaneous breathing, nonlabored ventilation and respiratory function stable Postop Assessment: no apparent nausea or vomiting Anesthetic complications: no     Last Vitals:  Vitals:   05/24/18 1215  BP: 132/84  Pulse: 83  Resp: 18  Temp: 36.7 C  SpO2: 98%    Last Pain:  Vitals:   05/24/18 1222  TempSrc:   PainSc: 0-No pain                 Blayton Huttner J

## 2018-05-24 NOTE — Transfer of Care (Signed)
Immediate Anesthesia Transfer of Care Note  Patient: CHARM STENNER  Procedure(s) Performed: COLONOSCOPY WITH PROPOFOL (N/A )  Patient Location: PACU  Anesthesia Type:MAC  Level of Consciousness: awake and patient cooperative  Airway & Oxygen Therapy: Patient Spontanous Breathing  Post-op Assessment: Report given to RN, Post -op Vital signs reviewed and stable and Patient moving all extremities  Post vital signs: Reviewed and stable  Last Vitals:  Vitals Value Taken Time  BP    Temp    Pulse    Resp    SpO2      Last Pain:  Vitals:   05/24/18 1222  TempSrc:   PainSc: 0-No pain      Patients Stated Pain Goal: 4 (01/24/75 2831)  Complications: No apparent anesthesia complications

## 2018-05-24 NOTE — H&P (Signed)
Primary Care Physician:  Selinda Flavin, MD Primary Gastroenterologist:  Dr. Darrick Penna  Pre-Procedure History & Physical: HPI:  [name] is a 67 y.o. female here for CHANGE IN BOWEL HABITS: constipation.  Past Medical History:  Diagnosis Date  . AMI (acute myocardial infarction) (HCC) 2007   failed PCI to the distal LAD - managed medically  . Chronic pain   . Complication of anesthesia   . CVA (cerebral vascular accident) (HCC) 1999  . Degenerative disc disease, cervical   . Hypothyroidism   . PONV (postoperative nausea and vomiting)   . Unstable angina [facility])     Past Surgical History:  Procedure Laterality Date  . ENDOMETRIAL ABLATION  3/06  . left ankle surgery      Prior to Admission medications   Medication Sig Start Date End Date Taking? Authorizing Provider  ALPRAZolam Prudy Feeler) 0.5 MG tablet Take 0.5 mg by mouth 2 (two) times daily as needed for anxiety.    Yes [provider]  Calcium Carbonate-Vit D-Min (CALCIUM 1200 PO) Take 1 tablet by mouth daily.    Yes [provider]  cetirizine (ZYRTEC) 10 MG tablet Take 10 mg by mouth at bedtime.    Yes [provider]  Cholecalciferol (VITAMIN D3) 1000 units CAPS Take 1,000 Units by mouth daily.   Yes [provider]  clopidogrel (PLAVIX) 75 MG tablet Take 1 tablet (75 mg total) by mouth daily. 12/26/10  Yes Wall, Jesse Sans, MD  DULoxetine (CYMBALTA) 60 MG capsule Take 1 capsule (60 mg total) by mouth daily. Patient taking differently: Take 60 mg by mouth every evening.  08/17/17  Yes [name], MD  FLUoxetine (PROZAC) 20 MG capsule Take 20 mg by mouth daily.     Yes [provider]  fluticasone (FLONASE) 50 MCG/ACT nasal spray Place 2 sprays into both nostrils daily as needed for allergies.  10/09/16 05/16/18 Yes [provider]  gabapentin (NEURONTIN) 600 MG tablet Take 1,200 mg by mouth at bedtime.    Yes [provider]  guaiFENesin (MUCINEX) 600 MG 12 hr tablet  Take 600 mg by mouth 2 (two) times daily as needed for cough or to loosen phlegm.   Yes [provider]  HYDROCORTISONE ACE, RECTAL, 30 MG SUPP Place 1 suppository (30 mg total) rectally 2 (two) times daily. 05/16/18  Yes Tiffany Kocher, PA-C  ibandronate (BONIVA) 150 MG tablet Take 150 mg by mouth every 30 (thirty) days. Take in the morning with a full glass of water, on an empty stomach, and do not take anything else by mouth or lie down for the next 30 min.   Yes [provider]  levothyroxine (SYNTHROID, LEVOTHROID) 75 MCG tablet Take 75 mcg by mouth daily before breakfast.   Yes [provider]  linaclotide (LINZESS) 290 MCG CAPS capsule Take 290 mcg by mouth daily before breakfast.    Yes [provider]  Menthol (ICY HOT) 5 % PTCH Apply 1 patch topically at bedtime.   Yes [provider]  nitroGLYCERIN (NITROSTAT) 0.4 MG SL tablet Place 1 tablet (0.4 mg total) under the tongue every 5 (five) minutes as needed. Patient taking differently: Place 0.4 mg under the tongue every 5 (five) minutes as needed for chest pain.  08/31/17  Yes Rosalio Macadamia, NP  NUCYNTA 50 MG tablet Take 50 mg by mouth 4 (four) times daily as needed for severe pain. 05/10/18  Yes [provider]  NUCYNTA ER 50 MG 12 hr tablet  Take 50 mg by mouth every 12 (twelve) hours.  04/30/16  Yes [provider]  OnabotulinumtoxinA, Cosmetic, (BOTOX COSMETIC IM) Inject 500 mg into the muscle every 3 (three) months.   Yes [provider]  pantoprazole (PROTONIX) 40 MG tablet Take 40 mg by mouth daily as needed (for acid reflux).    Yes [provider]  polyethylene glycol powder (GLYCOLAX/MIRALAX) powder Take 17 g by mouth daily as needed for moderate constipation.  06/11/16  Yes [provider]  ranolazine (RANEXA) 500 MG 12 hr tablet Take 1 tablet (500 mg total) by mouth daily. 02/02/18  Yes Rosalio Macadamia, NP  rOPINIRole (REQUIP) 2 MG tablet Take  2 mg by mouth at bedtime.    Yes [provider]  simvastatin (ZOCOR) 40 MG tablet Take 20 mg by mouth at bedtime.    Yes [provider]  aspirin 81 MG tablet Take 81 mg by mouth daily.    [provider]  DENTA 5000 PLUS 1.1 % CREA dental cream Place 1 application onto teeth at bedtime.  06/12/16   [provider]  diphenhydrAMINE (BENADRYL) 12.5 MG/5ML liquid Take 12.5 mg by mouth 4 (four) times daily as needed for allergies.    [provider]    Allergies as of 05/16/2018 - Review Complete 05/16/2018  Allergen Reaction Noted  . Morphine Other (See Comments) 01/01/2009  . Zolpidem tartrate Other (See Comments) 01/01/2009  . Penicillins Rash and Other (See Comments) 01/01/2009    Family History  Problem Relation Age of Onset  . Arrhythmia Father        PPM  . Colon cancer Neg Hx     Social History   Socioeconomic History  . Marital status: Married    Spouse name: Not on file  . Number of children: Not on file  . Years of education: Not on file  . Highest education level: Not on file  Occupational History  . Occupation: disabled  Social Needs  . Financial resource strain: Not on file  . Food insecurity:    Worry: Not on file    Inability: Not on file  . Transportation needs:    Medical: Not on file    Non-medical: Not on file  Tobacco Use  . Smoking status: Former Games developer  . Smokeless tobacco: Never Used  . Tobacco comment: Quit in 2005  Substance and Sexual Activity  . Alcohol use: Yes    Comment: social  . Drug use: No  . Sexual activity: Not Currently  Lifestyle  . Physical activity:    Days per week: Not on file    Minutes per session: Not on file  . Stress: Not on file  Relationships  . Social connections:    Talks on phone: Not on file    Gets together: Not on file    Attends religious service: Not on file    Active member of club or organization: Not on file    Attends meetings of clubs or organizations:  Not on file    Relationship status: Not on file  . Intimate partner violence:    Fear of current or ex partner: Not on file    Emotionally abused: Not on file    Physically abused: Not on file    Forced sexual activity: Not on file  Other Topics Concern  . Not on file  Social History Narrative   Married, no children   Right handed   12th grade   1 cup  daily    Review of Systems: See HPI, otherwise negative ROS   Physical Exam: There were no vitals taken for this visit. General:   Alert,  pleasant and cooperative in NAD Head:  Normocephalic and atraumatic. Neck:  Supple; Lungs:  Clear throughout to auscultation.    Heart:  Regular rate and rhythm. Abdomen:  Soft, nontender and nondistended. Normal bowel sounds, without guarding, and without rebound.   Neurologic:  Alert and  oriented x4;  grossly normal neurologically.  Impression/Plan:    CHANGE IN BOWEL HABITS: constipation  Plan: TCS TODAY. DISCUSSED PROCEDURE, BENEFITS, & RISKS: < 1% chance of medication reaction, bleeding, perforation, or rupture of spleen/liver.

## 2018-05-24 NOTE — Anesthesia Preprocedure Evaluation (Signed)
Anesthesia Evaluation  Patient identified by MRN, date of birth, ID band Patient awake    Reviewed: Allergy & Precautions, NPO status , Patient's Chart, lab work & pertinent test results  History of Anesthesia Complications (+) PONV  Airway Mallampati: II  TM Distance: >3 FB Neck ROM: Full    Dental no notable dental hx. 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 CVA, Residual Symptoms negative psych ROS   GI/Hepatic negative GI ROS, Neg liver ROS,   Endo/Other  Hypothyroidism   Renal/GU negative Renal ROS  negative genitourinary   Musculoskeletal  (+) Arthritis , Osteoarthritis,    Abdominal   Peds negative pediatric ROS (+)  Hematology negative hematology ROS (+)   Anesthesia Other Findings   Reproductive/Obstetrics negative OB ROS                             Anesthesia Physical Anesthesia Plan  ASA: III  Anesthesia Plan: General   Post-op Pain Management:    Induction: Intravenous  PONV Risk Score and Plan:   Airway Management Planned: Nasal Cannula and Simple Face Mask  Additional Equipment:   Intra-op Plan:   Post-operative Plan:   Informed Consent: I have reviewed the patients History and Physical, chart, labs and discussed the procedure including the risks, benefits and alternatives for the proposed anesthesia with the patient or authorized representative who has indicated his/her understanding and acceptance.   Dental advisory given  Plan Discussed with: CRNA  Anesthesia Plan Comments:         Anesthesia  Quick Evaluation

## 2018-05-24 NOTE — Discharge Instructions (Signed)
You have moderate external hemorrhoids. YOU DID NOT HAVE ANY POLYPS.   DRINK WATER TO KEEP YOUR URINE LIGHT YELLOW.  FOLLOW A HIGH FIBER DIET. AVOID ITEMS THAT CAUSE BLOATING. SEE INFO BELOW.  CONTINUE LINZESS.  USE PREPARATION H FOUR TIMES A DAY IF NEEDED TO RELIEVE RECTAL PAIN/PRESSURE/BLEEDING.  FOLLOW UP IN 4 MOS.   Next colonoscopy in 10-15 years if the benefits outweigh the risks.   Colonoscopy Care After Read the instructions outlined below and refer to this sheet in the next week. These discharge instructions provide you with general information on caring for yourself after you leave the hospital. While your treatment has been planned according to the most current medical practices available, unavoidable complications occasionally occur. If you have any problems or questions after discharge, call DR. Jillien Yakel, [phone].  ACTIVITY  You may resume your regular activity, but move at a slower pace for the next 24 hours.   Take frequent rest periods for the next 24 hours.   Walking will help get rid of the air and reduce the bloated feeling in your belly (abdomen).   No driving for 24 hours (because of the medicine (anesthesia) used during the test).   You may shower.   Do not sign any important legal documents or operate any machinery for 24 hours (because of the anesthesia used during the test).    NUTRITION  Drink plenty of fluids.   You may resume your normal diet as instructed by your doctor.   Begin with a light meal and progress to your normal diet. Heavy or fried foods are harder to digest and may make you feel sick to your stomach (nauseated).   Avoid alcoholic beverages for 24 hours or as instructed.    MEDICATIONS  You may resume your normal medications.   WHAT YOU CAN EXPECT TODAY  Some feelings of bloating in the abdomen.   Passage of more gas than usual.   Spotting of blood in your stool or on the toilet paper  .  IF YOU HAD POLYPS REMOVED  DURING THE COLONOSCOPY:  Eat a soft diet IF YOU HAVE NAUSEA, BLOATING, ABDOMINAL PAIN, OR VOMITING.    FINDING OUT THE RESULTS OF YOUR TEST Not all test results are available during your visit. DR. Darrick Penna WILL CALL YOU WITHIN 14 DAYS OF YOUR PROCEDUE WITH YOUR RESULTS. Do not assume everything is normal if you have not heard from DR. Kelsay Haggard, CALL HER OFFICE AT [phone].  SEEK IMMEDIATE MEDICAL ATTENTION AND CALL THE OFFICE: [phone] IF:  You have more than a spotting of blood in your stool.   Your belly is swollen (abdominal distention).   You are nauseated or vomiting.   You have a temperature over 101F.   You have abdominal pain or discomfort that is severe or gets worse throughout the day.  High-Fiber Diet A high-fiber diet changes your normal diet to include more whole grains, legumes, fruits, and vegetables. Changes in the diet involve replacing refined carbohydrates with unrefined foods. The calorie level of the diet is essentially unchanged. The Dietary Reference Intake (recommended amount) for adult males is 38 grams per day. For adult females, it is 25 grams per day. Pregnant and lactating women should consume 28 grams of fiber per day. Fiber is the intact part of a plant that is not broken down during digestion. Functional fiber is fiber that has been isolated from the plant to provide a beneficial effect in the body. PURPOSE  Increase stool bulk.  Ease and regulate bowel movements.   Lower cholesterol.   REDUCE RISK OF COLON CANCER  INDICATIONS THAT YOU NEED MORE FIBER  Constipation and hemorrhoids.   Uncomplicated diverticulosis (intestine condition) and irritable bowel syndrome.   Weight management.   As a protective measure against hardening of the arteries (atherosclerosis), diabetes, and cancer.   GUIDELINES FOR INCREASING FIBER IN THE DIET  Start adding fiber to the diet slowly. A gradual increase of about 5 more grams (2 slices of whole-wheat  bread, 2 servings of most fruits or vegetables, or 1 bowl of high-fiber cereal) per day is best. Too rapid an increase in fiber may result in constipation, flatulence, and bloating.   Drink enough water and fluids to keep your urine clear or pale yellow. Water, juice, or caffeine-free drinks are recommended. Not drinking enough fluid may cause constipation.   Eat a variety of high-fiber foods rather than one type of fiber.   Try to increase your intake of fiber through using high-fiber foods rather than fiber pills or supplements that contain small amounts of fiber.   The goal is to change the types of food eaten. Do not supplement your present diet with high-fiber foods, but replace foods in your present diet.    INCLUDE A VARIETY OF FIBER SOURCES  Replace refined and processed grains with whole grains, canned fruits with fresh fruits, and incorporate other fiber sources. White rice, white breads, and most bakery goods contain little or no fiber.   Brown whole-grain rice, buckwheat oats, and many fruits and vegetables are all good sources of fiber. These include: broccoli, Brussels sprouts, cabbage, cauliflower, beets, sweet potatoes, white potatoes (skin on), carrots, tomatoes, eggplant, squash, berries, fresh fruits, and dried fruits.   Cereals appear to be the richest source of fiber. Cereal fiber is found in whole grains and bran. Bran is the fiber-rich outer coat of cereal grain, which is largely removed in refining. In whole-grain cereals, the bran remains. In breakfast cereals, the largest amount of fiber is found in those with "bran" in their names. The fiber content is sometimes indicated on the label.   You may need to include additional fruits and vegetables each day.   In baking, for 1 cup white flour, you may use the following substitutions:   1 cup whole-wheat flour minus 2 tablespoons.   1/2 cup white flour plus 1/2 cup whole-wheat flour.   Hemorrhoids Hemorrhoids are  dilated (enlarged) veins around the rectum. Sometimes clots will form in the veins. This makes them swollen and painful. These are called thrombosed hemorrhoids. Causes of hemorrhoids include:  Constipation.   Straining to have a bowel movement.   HEAVY LIFTING  HOME CARE INSTRUCTIONS  Eat a well balanced diet and drink 6 to 8 glasses of water every day to avoid constipation. You may also use a bulk laxative.   Avoid straining to have bowel movements.   Keep anal area dry and clean.   Do not use a donut shaped pillow or sit on the toilet for long periods. This increases blood pooling and pain.   Move your bowels when your body has the urge; this will require less straining and will decrease pain and pressure.

## 2018-05-30 ENCOUNTER — Encounter (HOSPITAL_COMMUNITY): Payer: Self-pay | Admitting: Gastroenterology

## 2018-06-07 DIAGNOSIS — M542 Cervicalgia: Secondary | ICD-10-CM | POA: Diagnosis not present

## 2018-06-07 DIAGNOSIS — G890 Central pain syndrome: Secondary | ICD-10-CM | POA: Diagnosis not present

## 2018-06-07 DIAGNOSIS — G894 Chronic pain syndrome: Secondary | ICD-10-CM | POA: Diagnosis not present

## 2018-06-10 DIAGNOSIS — Z23 Encounter for immunization: Secondary | ICD-10-CM | POA: Diagnosis not present

## 2018-06-10 DIAGNOSIS — R69 Illness, unspecified: Secondary | ICD-10-CM | POA: Diagnosis not present

## 2018-07-11 NOTE — Progress Notes (Signed)
CARDIOLOGY OFFICE NOTE  Date:  07/12/2018    [name] Date of Birth: 15-Dec-1950 Medical Record #[id number]  PCP:  Selinda Flavin, MD  Cardiologist:  Tyrone Sage    Chief Complaint  Patient presents with  . Coronary Artery Disease    Follow up visit     History of Present Illness: [name] is a 67 y.o. female who presents today for a 3 month check. She is a former patient of Dr. Vern Claude - now following by me.   She hasa history ofCAD with past MI in 2007 and had failed PCI to the distal LAD - managed medically -hasused"PRN" Ranexa since that time. On chronic DAPT with aspirin/Plavix. Other issues include prior CVA from 1999 (on aspirin/Plavix), hypothyroidism, HLD and GERD.  When seen in January of 2017 washaving some chest pain. Her exercise tolerance was stable. We did get her Myoview updated and this looked ok.Sheremains mostly limited bysignificant nerve pain fromaremote stroke but she continues to "press on"in quite an amazing fashion.Last visit with me was in January of 2019- stable from our standpoint.She had not used NTG in some time but still would use "prn" Ranexa as instructed in the remote past.   Seen back in July for a work in visit - having lots of symptoms . Husband noted a definite change with more fatigue and more complaints of chest pain. 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 Elected to start daily Ranexa versus proceeding with cardiac cath - she has not been able to tolerate BID dosing per her report due to "terrific headache". On return she was much improved and we elected to proceed on with medical therapy. We did have to cut her Zocor back and she postponed her colonoscopy. Last seen in September - she had had 2 falls - no serious injury fortunately. She had had one spell of shortness of breath while on the treadmill but no chest/jaw pain. Remains more limited by her nerve pain. We  elected to continue with medical therapy.   Comes in today. Here with her husband today.  She was able to have her colonoscopy back in October - this went well. She says she is doing ok. No spells of chest pain. No jaw pain. Not using any NTG. Breathing is good. She is happy with how she is doing. She will be going in a study for her cholesterol. Biggest issue remains nerve pain but still, does amazingly well.   Past Medical History:  Diagnosis Date  . AMI (acute myocardial infarction) (HCC) 2007   failed PCI to the distal LAD - managed medically  . Chronic pain   . Complication of anesthesia   . CVA (cerebral vascular accident) (HCC) 1999  . Degenerative disc disease, cervical   . Hypothyroidism   . PONV (postoperative nausea and vomiting)   . Unstable angina [facility])     Past Surgical History:  Procedure Laterality Date  . COLONOSCOPY WITH PROPOFOL N/A 05/24/2018   Procedure: COLONOSCOPY WITH PROPOFOL;  Surgeon: West Bali, MD;  Location: [facility];  Service: Endoscopy;  Laterality: N/A;  12:45pm  . ENDOMETRIAL ABLATION  3/06  . left ankle surgery       Medications: Current Meds  Medication Sig  . ALPRAZolam (XANAX) 0.5 MG tablet Take 0.5 mg by mouth 2 (two) times daily as needed for anxiety.   Marland Kitchen aspirin 81 MG tablet Take 81 mg by mouth  daily.  . Calcium Carbonate-Vit D-Min (CALCIUM 1200 PO) Take 1 tablet by mouth daily.   . cetirizine (ZYRTEC) 10 MG tablet Take 10 mg by mouth at bedtime.   . Cholecalciferol (VITAMIN D3) 1000 units CAPS Take 1,000 Units by mouth daily.  . clopidogrel (PLAVIX) 75 MG tablet Take 1 tablet (75 mg total) by mouth daily.  . DENTA 5000 PLUS 1.1 % CREA dental cream Place 1 application onto teeth at bedtime.   . diphenhydrAMINE (BENADRYL) 12.5 MG/5ML liquid Take 12.5 mg by mouth 4 (four) times daily as needed for allergies.  . DULoxetine (CYMBALTA) 60 MG capsule Take 60 mg by mouth daily.  Marland Kitchen FLUoxetine (PROZAC) 20 MG capsule Take 20 mg by mouth  daily.    Marland Kitchen gabapentin (NEURONTIN) 600 MG tablet Take 1,200 mg by mouth at bedtime.   Marland Kitchen guaiFENesin (MUCINEX) 600 MG 12 hr tablet Take 600 mg by mouth 2 (two) times daily as needed for cough or to loosen phlegm.  Marland Kitchen HYDROCORTISONE ACE, RECTAL, 30 MG SUPP Place 1 suppository (30 mg total) rectally 2 (two) times daily.  Marland Kitchen ibandronate (BONIVA) 150 MG tablet Take 150 mg by mouth every 30 (thirty) days. Take in the morning with a full glass of water, on an empty stomach, and do not take anything else by mouth or lie down for the next 30 min.  Marland Kitchen levothyroxine (SYNTHROID, LEVOTHROID) 75 MCG tablet Take 75 mcg by mouth daily before breakfast.  . linaclotide (LINZESS) 290 MCG CAPS capsule Take 290 mcg by mouth daily before breakfast.   . Menthol (ICY HOT) 5 % PTCH Apply 1 patch topically at bedtime.  . nitroGLYCERIN (NITROSTAT) 0.4 MG SL tablet Place 0.4 mg under the tongue every 5 (five) minutes as needed for chest pain.  . NUCYNTA 50 MG tablet Take 50 mg by mouth 4 (four) times daily as needed for severe pain.  Raylene Miyamoto ER 50 MG 12 hr tablet Take 50 mg by mouth every 12 (twelve) hours.   . pantoprazole (PROTONIX) 40 MG tablet Take 40 mg by mouth daily as needed (for acid reflux).   . polyethylene glycol powder (GLYCOLAX/MIRALAX) powder Take 17 g by mouth daily as needed for moderate constipation.   . ranolazine (RANEXA) 500 MG 12 hr tablet Take 1 tablet (500 mg total) by mouth daily.  Marland Kitchen rOPINIRole (REQUIP) 2 MG tablet Take 2 mg by mouth at bedtime.   . simvastatin (ZOCOR) 40 MG tablet Take 20 mg by mouth at bedtime.   . [DISCONTINUED] OnabotulinumtoxinA, Cosmetic, (BOTOX COSMETIC IM) Inject 500 mg into the muscle every 3 (three) months.     Allergies: Allergies  Allergen Reactions  . Morphine Other (See Comments)    Sick on stomach  . Zolpidem Tartrate Other (See Comments)    Sleep walk  . Penicillins Rash and Other (See Comments)    Has patient had a PCN reaction causing immediate rash,  facial/tongue/throat swelling, SOB or lightheadedness with hypotension: Unknown Has patient had a PCN reaction causing severe rash involving mucus membranes or skin necrosis: No Has patient had a PCN reaction that required hospitalization: No Has patient had a PCN reaction occurring within the last 10 years: No If all of the above answers are "NO", then may proceed with Cephalosporin use.     Social History: The patient  reports that she has quit smoking. She has never used smokeless tobacco. She reports that she drinks alcohol. She reports that she does not use drugs.   Family  History: The patient's family history includes Arrhythmia in her father.   Review of Systems: Please see the history of present illness.   Otherwise, the review of systems is positive for constipation, some anxiety at times and headaches. She has chronic nerve pain.   All other systems are reviewed and negative.   Physical Exam: VS:  BP 110/80   Pulse 80   Ht 5\' 2"  (1.575 m)   Wt 136 lb 1.9 oz (61.7 kg)   SpO2 96%   BMI 24.90 kg/m  .  BMI Body mass index is 24.9 kg/m.  Wt Readings from Last 3 Encounters:  07/12/18 136 lb 1.9 oz (61.7 kg)  05/24/18 130 lb (59 kg)  05/16/18 134 lb 9.6 oz (61.1 kg)    General: Pleasant. Alert and in no acute distress.   HEENT: Normal.  Neck: Supple, no JVD, carotid bruits, or masses noted.  Cardiac: Regular rate and rhythm. No murmurs, rubs, or gallops. No edema.  Respiratory:  Lungs are clear to auscultation bilaterally with normal work of breathing.  GI: Soft and nontender.  MS: No deformity or atrophy. Gait and ROM intact. She walks with a limp. Her left arm is drawn.  Skin: Warm and dry. Color is normal.  Neuro:  Strength and sensation are intact and no gross focal deficits noted.  Psych: Alert, appropriate and with normal affect.   LABORATORY DATA:  EKG:  EKG is not ordered today.  Lab Results  Component Value Date   WBC 7.7 11/17/2017   HGB 13.9  11/17/2017   HCT 41.0 11/17/2017   PLT 307 11/17/2017   GLUCOSE 97 04/19/2018   CHOL 171 04/19/2018   TRIG 78 04/19/2018   HDL 59 04/19/2018   LDLCALC 96 04/19/2018   ALT 11 04/19/2018   AST 17 04/19/2018   NA 138 04/19/2018   K 4.5 04/19/2018   CL 99 04/19/2018   CREATININE 0.83 04/19/2018   BUN 12 04/19/2018   CO2 26 04/19/2018   TSH 0.940 11/17/2017     BNP (last 3 results) No results for input(s): BNP in the last 8760 hours.  ProBNP (last 3 results) No results for input(s): PROBNP in the last 8760 hours.   Other Studies Reviewed Today:  MyoviewStudy Result1/2017   No diagnostic ST segment changes to indicate ischemia.  Small, mild intensity, lateral apical region of ischemia in the setting of artifact from extracardiac radiotracer uptake and also arms down imaging. No large ischemic territories.  This is a low risk study.  Nuclear stress EF: 74%.     Echo Study Conclusions from 2011 Left ventricle: The cavity size was normal. Systolic function was normal. The estimated ejection fraction was in the range of 55% to 60%. Wall motion was normal; there were no regional wall motion abnormalities.    CARDIAC CATHETERIZATION 2007  HEMODYNAMICS:  1. Left main coronary artery: The left main coronary artery is free of  significant disease.  2. Left anterior descending artery: The left anterior descending artery  gave rise to two diagonal branches and a septal perforator. The LAD was  quite tortuous in its mid portion and then had a 99% occlusion in its  distal portion with TIMI-I flow.  3. Circumflex artery: The circumflex artery gave rise to an atrial branch,  marginal branch, and two posterolateral branches. These vessels were  free of significant disease.  4. Right coronary artery: The right coronary artery is a moderate size  vessel which gave rise to a clonus  branch, right ventricular branch,  posterior descending branch, and a  posterolateral branch. This vessel  was free of significant disease.  LEFT VENTRICULOGRAM: The left ventriculogram was obtained in the RAO  projection showed akinesis of the tip of the apex in the inferoapical  segment. The overall wall motion was good with an estimated ejection  fraction of 60%.  Following attempt at PCA there was persistent 99% stenosis in the distal LAD  with TIMI-I flow.  CONCLUSION:  1. Coronary artery disease, status post recent non-ST-elevation myocardial  infarction with 99% stenosis in the distal left anterior descending with  TIMI-I flow, no significant obstruction in the circumflex and right  coronary arteries, and inferoapical wall akinesis.  2. Unsuccessful percutaneous coronary intervention of the lesion in the  distal left anterior descending due to inability to cross with the wire.  DISPOSITION: The patient returned to the postanesthesia care unit for  further observation. I think her outlook should be good on medical therapy.  She has no significant residual coronary disease and overall LV function is  good.  ______________________________  Charlies Constable, M.D. [facility]  BB/MEDQ D: 08/25/2005 T: 08/25/2005 Job: [id number]   Assessment / Plan:  1. CAD - remote NSTEMI - with unsuccessful PCI to the distal LADback in 2007and no significant residual CAD at that time -she has been managed medically since that time. She is on chronic DAPT. She was previously using prn Ranexa this has been changed to every day due to recurrent angina. She has elected for medical therapy and has not not wished to proceed with repeat cath due to risk of stroke. She continues to do well. She has not had anymore chest/jaw pain. She is not using NTG. She is happy with how she is doing. No changes made today. We will go back to every 6 month follow up unless problems arise.   2. HLD -She is on less Zocor due to drug interaction with the Ranexa -she is going to go  into the new lipid study. They will do her labs.   3. Prior stroke - previously on coumadin and has been on Aggrenox - now on chronic Plavixand aspirin due to her CAD.She is limited by residual nerve pain.She has chronic left sided weakness.  4. Chronic nerve pain - on Botox and narcotic therapy.She still does amazingly well in my opinion.   Current medicines are reviewed with the patient today.  The patient does not have concerns regarding medicines other than what has been noted above.  The following changes have been made:  See above.  Labs/ tests ordered today include:    No orders of the defined types were placed in this encounter.    Disposition:   FU with me in 6 months.   Patient is agreeable to this plan and will call if any problems develop in the interim.   SignedNorma Fredrickson, NP  07/12/2018 10:53 AM  [facility] [address] [place], [place]  [address] Phone: [phone] Fax: [phone]

## 2018-07-12 ENCOUNTER — Ambulatory Visit: Payer: Medicare HMO | Admitting: Nurse Practitioner

## 2018-07-12 ENCOUNTER — Encounter: Payer: Self-pay | Admitting: Nurse Practitioner

## 2018-07-12 VITALS — BP 110/80 | HR 80 | Ht 62.0 in | Wt 136.1 lb

## 2018-07-12 DIAGNOSIS — I259 Chronic ischemic heart disease, unspecified: Secondary | ICD-10-CM

## 2018-07-12 DIAGNOSIS — G894 Chronic pain syndrome: Secondary | ICD-10-CM | POA: Diagnosis not present

## 2018-07-12 DIAGNOSIS — G890 Central pain syndrome: Secondary | ICD-10-CM | POA: Diagnosis not present

## 2018-07-12 DIAGNOSIS — E7800 Pure hypercholesterolemia, unspecified: Secondary | ICD-10-CM

## 2018-07-12 DIAGNOSIS — M542 Cervicalgia: Secondary | ICD-10-CM | POA: Diagnosis not present

## 2018-07-12 NOTE — Patient Instructions (Addendum)
We will be checking the following labs today - NONE    Medication Instructions:    Continue with your current medicines.    If you need a refill on your cardiac medications before your next appointment, please call your pharmacy.     Testing/Procedures To Be Arranged:  N/A  Follow-Up:   See me in 6 months    At [facility], you and your health needs are our priority.  As part of our continuing mission to provide you with exceptional heart care, we have created designated Provider Care Teams.  These Care Teams include your primary Cardiologist (physician) and Advanced Practice Providers (APPs -  Physician Assistants and Nurse Practitioners) who all work together to provide you with the care you need, when you need it.  Special Instructions:  . None  Call the [facility] office at [phone] if you have any questions, problems or concerns.       

## 2018-07-14 DIAGNOSIS — L89892 Pressure ulcer of other site, stage 2: Secondary | ICD-10-CM | POA: Diagnosis not present

## 2018-07-14 DIAGNOSIS — M79672 Pain in left foot: Secondary | ICD-10-CM | POA: Diagnosis not present

## 2018-07-22 ENCOUNTER — Encounter: Payer: Medicare HMO | Admitting: *Deleted

## 2018-07-22 DIAGNOSIS — Z006 Encounter for examination for normal comparison and control in clinical research program: Secondary | ICD-10-CM

## 2018-07-22 NOTE — Research (Signed)
Subject met inclusion and exclusion criteria.  The informed consent form, study requirements and expectations were reviewed with the subject and questions and concerns were addressed prior to the signing of the consent form.  The subject verbalized understanding of the trial requirements.  The subject agreed to participate in the [place] 4 trial and signed the informed consent.  The informed consent was obtained prior to performance of any protocol-specific procedures for the subject.  A copy of the signed informed consent was given to the subject and a copy was placed in the subject's medical record.  Finger stick TC 156. Subject started in run-in phase of the study.  Next appointment scheduled.

## 2018-07-29 ENCOUNTER — Other Ambulatory Visit: Payer: Self-pay | Admitting: Neurology

## 2018-08-08 ENCOUNTER — Other Ambulatory Visit: Payer: Self-pay | Admitting: Neurology

## 2018-08-11 ENCOUNTER — Telehealth: Payer: Self-pay | Admitting: *Deleted

## 2018-08-11 ENCOUNTER — Ambulatory Visit (INDEPENDENT_AMBULATORY_CARE_PROVIDER_SITE_OTHER): Payer: Medicare HMO | Admitting: Neurology

## 2018-08-11 ENCOUNTER — Encounter: Payer: Self-pay | Admitting: Neurology

## 2018-08-11 VITALS — BP 122/62 | HR 80 | Ht 62.0 in | Wt 135.0 lb

## 2018-08-11 DIAGNOSIS — I69354 Hemiplegia and hemiparesis following cerebral infarction affecting left non-dominant side: Secondary | ICD-10-CM

## 2018-08-11 NOTE — Telephone Encounter (Signed)
Per Dr. [name], change patient from Botox 500 units to Xeomin 500 units for next injection.  This request is due to the high cost of Botox.

## 2018-08-11 NOTE — Progress Notes (Signed)
[facility]  CC:  Spastic hemiplegia  [name] is a 68  y.o. 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 Marya Fossa, received EMG guided Botox injection in 2010 for about 2 years, with good benefit, but has difficulty with insurance  She began to receive Botox injection through our clinic by Dr. Hosie Poisson since May 2015,150 units total to left upper extremity, which has been very helpful  She is return for continued EMG guided Botox injection, at baseline, she can ambulate with a left AFO, but without assistance,  Profound left upper extremity spasticity, achiness, chronic pain, persistent left wrist flexion, finger flexion,  She also has a history of coronary artery disease in 2007, taking Plavix, and Aggrenox, exercise regularly, has a personal trainer  UPDATE March 2nd 2016: She received 300 units in Jun 13 2014, to left upper extremity, which has been very helpful, she did not notice significant side effect. The injection help her open her left hand better, no significant side effect noticed.  UPDATE June 8th 2016: She received 300 units of Botox A in March second 2016, responded very well, she was able to relax her left hand better, she ambulate without assistance with spastic left hemiparesis, complains of left facial, left foot pain, wants the injection to emphasize on her left finger flexion wrist flexion today.  UPDATE Sep 28th 2016: She responded to previous Botox injection very well in June, she wants to emphasize on her left upper extremity at this time, complains forceful finger contraction, flexion, elbow flexion. Only mild left shoulder pain with passive  movement.  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 Patient is concerned about potential side effect of weakness.  Update May 21 2016: She responded very well to previous injection in July, only wants to receive injection for spastic left upper extremity, she had a pair of left wrist/finger splint,  Update September 09 2016: She is now using a left wrist splint, which has been helpful,  UPDATE Dec 10 2016: She responded well to previous injection no significant side effect noticed,   UPDATE Apr 14 2017: She wear left wrist pain, denies significant side effect, complains of left-sided neuropathic pain, despite Prozac 20 mg, Nucynta 50 mg, gabapentin 600 mg twice a day,  UPDATE Jul 14 2017: She responded very well to previous injection in September 2018,  UPDATE October 13 2017: She responded very well to previous Botox injection  UPDATE January 26 2018: She complains of left leg weakness was injection, wants to focus injection on left upper extremity,  UPDATE May 11 2018: She responded well to previous injection  Update August 11, 2018: She responded well to previous injection, no  significant side effect noted  Social History   Socioeconomic History  . Marital status: Married    Spouse name: Not on file  . Number of children: Not on file  . Years of education:  Not on file  . Highest education level: Not on file  Occupational History  . Occupation: disabled  Social Needs  . Financial resource strain: Not on file  . Food insecurity:    Worry: Not on file    Inability: Not on file  . Transportation needs:    Medical: Not on file    Non-medical: Not on file  Tobacco Use  . Smoking status: Former Games developer  . Smokeless tobacco: Never Used  . Tobacco comment: Quit in 2005  Substance and Sexual Activity  . Alcohol use: Yes    Comment: social  . Drug use: No  . Sexual activity: Not Currently  Lifestyle  . Physical activity:    Days per week: Not on file    Minutes per session: Not on file  . Stress: Not on file  Relationships  . Social connections:    Talks on phone: Not on file    Gets together: Not on file    Attends religious service: Not on file    Active member of club or organization: Not on file    Attends meetings of clubs or organizations: Not on file    Relationship status: Not on file  . Intimate partner violence:    Fear of current or ex partner: Not on file    Emotionally abused: Not on file    Physically abused: Not on file    Forced sexual activity: Not on file  Other Topics Concern  . Not on file  Social History Narrative   Married, no children   Right handed   12th grade   1 cup daily    Family History  Problem Relation Age of Onset  . Arrhythmia Father        PPM  . Colon cancer Neg Hx     Past Medical History:  Diagnosis Date  . AMI (acute myocardial infarction) (HCC) 2007   failed PCI to the distal LAD - managed medically  . Chronic pain   . Complication of anesthesia   . CVA (cerebral vascular accident) (HCC) 1999  . Degenerative disc disease, cervical   . Hypothyroidism   . PONV (postoperative nausea and vomiting)   . Unstable angina [facility])     Past Surgical History:  Procedure Laterality Date  . COLONOSCOPY WITH PROPOFOL N/A 05/24/2018   Procedure: COLONOSCOPY WITH PROPOFOL;  Surgeon:  West Bali, MD;  Location: [facility];  Service: Endoscopy;  Laterality: N/A;  12:45pm  . ENDOMETRIAL ABLATION  3/06  . left ankle surgery      Current Outpatient Medications  Medication Sig Dispense Refill  . ALPRAZolam (XANAX) 0.5 MG tablet Take 0.5 mg by mouth 2 (two) times daily as needed for anxiety.     Marland Kitchen aspirin 81 MG tablet Take 81 mg by mouth daily.    Marland Kitchen BOTOX 100 units SOLR injection INJECT AS DIRECTED AT PHYSICIANS OFFICE 5 vial 3  . Calcium Carbonate-Vit D-Min (CALCIUM 1200 PO) Take 1 tablet by mouth daily.     . cetirizine (ZYRTEC) 10 MG tablet Take 10 mg by mouth at bedtime.     . Cholecalciferol (VITAMIN D3) 1000 units CAPS Take 1,000 Units by mouth daily.    . clopidogrel (PLAVIX) 75 MG tablet Take 1 tablet (  75 mg total) by mouth daily. 90 tablet 3  . DENTA 5000 PLUS 1.1 % CREA dental cream Place 1 application onto teeth at bedtime.   4  . diphenhydrAMINE (BENADRYL) 12.5 MG/5ML liquid Take 12.5 mg by mouth 4 (four) times daily as needed for allergies.    . DULoxetine (CYMBALTA) 60 MG capsule Take 60 mg by mouth daily.    Marland Kitchen FLUoxetine (PROZAC) 20 MG capsule Take 20 mg by mouth daily.      Marland Kitchen gabapentin (NEURONTIN) 600 MG tablet Take 1,200 mg by mouth at bedtime.     Marland Kitchen guaiFENesin (MUCINEX) 600 MG 12 hr tablet Take 600 mg by mouth 2 (two) times daily as needed for cough or to loosen phlegm.    . ibandronate (BONIVA) 150 MG tablet Take 150 mg by mouth every 30 (thirty) days. Take in the morning with a full glass of water, on an empty stomach, and do not take anything else by mouth or lie down for the next 30 min.    Marland Kitchen levothyroxine (SYNTHROID, LEVOTHROID) 75 MCG tablet Take 75 mcg by mouth daily before breakfast.    . linaclotide (LINZESS) 290 MCG CAPS capsule Take 290 mcg by mouth daily before breakfast.     . Menthol (ICY HOT) 5 % PTCH Apply 1 patch topically at bedtime.    . nitroGLYCERIN (NITROSTAT) 0.4 MG SL tablet Place 0.4 mg under the tongue every 5 (five) minutes  as needed for chest pain.    . NUCYNTA 50 MG tablet Take 50 mg by mouth 4 (four) times daily as needed for severe pain.  0  . NUCYNTA ER 50 MG 12 hr tablet Take 50 mg by mouth every 12 (twelve) hours.   0  . pantoprazole (PROTONIX) 40 MG tablet Take 40 mg by mouth daily as needed (for acid reflux).     . polyethylene glycol powder (GLYCOLAX/MIRALAX) powder Take 17 g by mouth daily as needed for moderate constipation.   6  . ranolazine (RANEXA) 500 MG 12 hr tablet Take 1 tablet (500 mg total) by mouth daily. 90 tablet 3  . rOPINIRole (REQUIP) 2 MG tablet Take 2 mg by mouth at bedtime.     . simvastatin (ZOCOR) 40 MG tablet Take 20 mg by mouth at bedtime.     . fluticasone (FLONASE) 50 MCG/ACT nasal spray Place 2 sprays into both nostrils daily as needed for allergies.      No current facility-administered medications for this visit.     Allergies as of 08/11/2018 - Review Complete 08/11/2018  Allergen Reaction Noted  . Morphine Other (See Comments) 01/01/2009  . Zolpidem tartrate Other (See Comments) 01/01/2009  . Penicillins Rash and Other (See Comments) 01/01/2009    Vitals: BP 122/62   Pulse 80   Ht 5\' 2"  (1.575 m)   Wt 135 lb (61.2 kg)   BMI 24.69 kg/m  Last Weight:  Wt Readings from Last 1 Encounters:  08/11/18 135 lb (61.2 kg)   Last Height:   Ht Readings from Last 1 Encounters:  08/11/18 5\' 2"  (1.575 m)   PHYSICAL EXAMINATOINS:  Generalized: In no acute distress  Neck: Supple, no carotid bruits   Cardiac: Regular rate rhythm  Pulmonary: Clear to auscultation bilaterally  Musculoskeletal: No deformity  Neurological examination  Mentation: Alert oriented to time, place, history taking, and causual conversation, left lower face weakness  Cranial nerve II-XII: Pupils were equal round reactive to light extraocular movements were full, left visual field cut.  Left lower face weakness. hearing was intact to finger rubbing bilaterally. Uvula tongue midline.  head  turning and shoulder shrug and were normal and symmetric.Tongue protrusion into cheek strength was normal.  Motor: spastic left hemiparesis,  Wearing left AFO, left lower extremity moderate spasticity, left hip flexion 4, knee flexion 4, extension 5, Profound left upper extremity spasticity, left shoulder adduction, internal rotation.left elbow flexion, pronation, with passive movement, mild fixed contraction of left elbow, maximum 175, left wrist forceful flexion, finger flexion,thumb in position, even with forceful movement, she could not achieve full extension of her left finger  Gait: Rising up from seated position by pushing on chair arm,spastic left hemi-circumferential gait.  Deep tendon reflexes: hyperreflexia of left side   Assessment and plan:   68 years old right-handed Caucasian female, status post stroke, with spastic left hemiparesis since 1999, responded very well to previous EMG guided Botox injection, return to clinic for repeat injection  Under electric stimulation, 500 units of Botox A was injected into left upper extremity muscles  Left pronator teres 25 x2 =50 units Left flexor carpi radialis 50 units Left  flexor carpi ulnaris 25 x2=50units Left flexor digitorum profundus 50 units  Left brachialis 100 units  Left palmaris longus 25 x2=50 units Left pectoralis major 100 units Left latissimus dorsi 50 units   She is concerned about the high co-pay with BOTOX injection, will change to xeomin  Levert FeinsteinYijun Iyanni Hepp, M.D. Ph.D.  [facility] [address] [place], [place] [address] Phone: [phone] Fax:      [phone]

## 2018-08-11 NOTE — Progress Notes (Signed)
**  Botox 100 units x 5 vials, NDC 3149-7026-37, Lot [id number], Exp 12/2020, specialty pharmacy.//mck,rn**

## 2018-08-11 NOTE — Telephone Encounter (Signed)
Noted, thank you

## 2018-08-16 ENCOUNTER — Ambulatory Visit: Payer: Medicare HMO | Admitting: Nurse Practitioner

## 2018-08-16 DIAGNOSIS — M542 Cervicalgia: Secondary | ICD-10-CM | POA: Diagnosis not present

## 2018-08-16 DIAGNOSIS — Z79899 Other long term (current) drug therapy: Secondary | ICD-10-CM | POA: Diagnosis not present

## 2018-08-16 DIAGNOSIS — Z79891 Long term (current) use of opiate analgesic: Secondary | ICD-10-CM | POA: Diagnosis not present

## 2018-08-16 DIAGNOSIS — G890 Central pain syndrome: Secondary | ICD-10-CM | POA: Diagnosis not present

## 2018-08-16 DIAGNOSIS — G894 Chronic pain syndrome: Secondary | ICD-10-CM | POA: Diagnosis not present

## 2018-09-02 DIAGNOSIS — G629 Polyneuropathy, unspecified: Secondary | ICD-10-CM | POA: Diagnosis not present

## 2018-09-02 DIAGNOSIS — K219 Gastro-esophageal reflux disease without esophagitis: Secondary | ICD-10-CM | POA: Diagnosis not present

## 2018-09-02 DIAGNOSIS — I69354 Hemiplegia and hemiparesis following cerebral infarction affecting left non-dominant side: Secondary | ICD-10-CM | POA: Diagnosis not present

## 2018-09-02 DIAGNOSIS — G8929 Other chronic pain: Secondary | ICD-10-CM | POA: Diagnosis not present

## 2018-09-02 DIAGNOSIS — I25119 Atherosclerotic heart disease of native coronary artery with unspecified angina pectoris: Secondary | ICD-10-CM | POA: Diagnosis not present

## 2018-09-02 DIAGNOSIS — J309 Allergic rhinitis, unspecified: Secondary | ICD-10-CM | POA: Diagnosis not present

## 2018-09-02 DIAGNOSIS — R69 Illness, unspecified: Secondary | ICD-10-CM | POA: Diagnosis not present

## 2018-09-02 DIAGNOSIS — E039 Hypothyroidism, unspecified: Secondary | ICD-10-CM | POA: Diagnosis not present

## 2018-09-02 DIAGNOSIS — I252 Old myocardial infarction: Secondary | ICD-10-CM | POA: Diagnosis not present

## 2018-09-05 ENCOUNTER — Other Ambulatory Visit: Payer: Self-pay | Admitting: Neurology

## 2018-09-13 ENCOUNTER — Encounter: Payer: Medicare HMO | Admitting: *Deleted

## 2018-09-13 DIAGNOSIS — G890 Central pain syndrome: Secondary | ICD-10-CM | POA: Diagnosis not present

## 2018-09-13 DIAGNOSIS — M542 Cervicalgia: Secondary | ICD-10-CM | POA: Diagnosis not present

## 2018-09-13 DIAGNOSIS — Z006 Encounter for examination for normal comparison and control in clinical research program: Secondary | ICD-10-CM

## 2018-09-13 DIAGNOSIS — G894 Chronic pain syndrome: Secondary | ICD-10-CM | POA: Diagnosis not present

## 2018-09-13 NOTE — Research (Signed)
Patient to research clinic for randomization visit in the [place] 4 Research trial.  No cos, aes or saes to report.  Subject received randomization injection and next appointment scheduled. 

## 2018-09-21 ENCOUNTER — Ambulatory Visit: Payer: Medicare HMO | Admitting: Gastroenterology

## 2018-09-26 ENCOUNTER — Telehealth: Payer: Self-pay | Admitting: Nurse Practitioner

## 2018-09-26 NOTE — Telephone Encounter (Signed)
No message needed °

## 2018-09-29 ENCOUNTER — Telehealth: Payer: Self-pay | Admitting: *Deleted

## 2018-09-29 NOTE — Telephone Encounter (Signed)
Completed [employer] form requesting further clinical information to approve patients RANEXA. Faxed with confirmation.

## 2018-09-30 NOTE — Telephone Encounter (Signed)
Letter received from [facility] stating that they have approved coverage of the pts Ranolazine at a lower co-pay. Approval good until 08/03/2019.  I have notified the pts pharmacy.

## 2018-10-10 DIAGNOSIS — G890 Central pain syndrome: Secondary | ICD-10-CM | POA: Diagnosis not present

## 2018-10-10 DIAGNOSIS — G894 Chronic pain syndrome: Secondary | ICD-10-CM | POA: Diagnosis not present

## 2018-10-10 DIAGNOSIS — M542 Cervicalgia: Secondary | ICD-10-CM | POA: Diagnosis not present

## 2018-10-12 DIAGNOSIS — J101 Influenza due to other identified influenza virus with other respiratory manifestations: Secondary | ICD-10-CM | POA: Diagnosis not present

## 2018-10-12 DIAGNOSIS — Z6823 Body mass index (BMI) 23.0-23.9, adult: Secondary | ICD-10-CM | POA: Diagnosis not present

## 2018-10-12 DIAGNOSIS — J111 Influenza due to unidentified influenza virus with other respiratory manifestations: Secondary | ICD-10-CM | POA: Insufficient documentation

## 2018-11-09 ENCOUNTER — Ambulatory Visit: Payer: Self-pay | Admitting: Neurology

## 2018-11-22 ENCOUNTER — Encounter: Payer: Self-pay | Admitting: Gastroenterology

## 2018-11-22 ENCOUNTER — Ambulatory Visit (INDEPENDENT_AMBULATORY_CARE_PROVIDER_SITE_OTHER): Payer: Medicare HMO | Admitting: Gastroenterology

## 2018-11-22 ENCOUNTER — Other Ambulatory Visit: Payer: Self-pay

## 2018-11-22 DIAGNOSIS — T402X5A Adverse effect of other opioids, initial encounter: Secondary | ICD-10-CM

## 2018-11-22 DIAGNOSIS — K5903 Drug induced constipation: Secondary | ICD-10-CM

## 2018-11-22 NOTE — Patient Instructions (Signed)
1. Try taking your Linzess with a small snack to see if your bowel movement pattern improves. 2. Once you complete Linzess, if you would like to try Amitiza I would be glad to send in prescription for you.  Just call and let me know. 3. We will plan to see you back in 6 months for follow-up.

## 2018-11-22 NOTE — Progress Notes (Signed)
Primary Care Physician:  Selinda Flavin, MD Primary GI:  Jonette Eva, MD   Patient Location: Home  Provider Location: [facility] office  Reason for Visit: f/u colonoscopy, constipation  Persons present on the virtual encounter, with roles: Patient, myself (provider), [name], CMA (updated meds and allergies)  Total time (minutes) spent on medical discussion: 25 minutes  Due to COVID-19, visit was conducted using FaceTime method.  Visit was requested by patient.  Virtual Visit via FaceTime  I connected with [name] on 11/22/18 at  9:00 AM EDT by Facetime and verified that I am speaking with the correct person using two identifiers.   I discussed the limitations, risks, security and privacy concerns of performing an evaluation and management service by telephone/video and the availability of in person appointments. I also discussed with the patient that there may be a patient responsible charge related to this service. The patient expressed understanding and agreed to proceed.  Chief Complaint  Patient presents with  . Follow-up    F/U Colonoscopy,Constipation    HPI:   [name] is a 68 y.o. female who presents for virtual visit regarding constipation and follow-up colonoscopy.  Previously did not get good results on Movantik.  We gave her samples of Linzess 290 mcg at her last office visit because she found the 145 mcg produce stool only 3 times per week.  Colonoscopy back in October showed significant looping, hemorrhoids but otherwise unremarkable.  No future colonoscopies recommended for screening purposes due to age.  Pain clinic gave her RX for Amitiza. Not sure what dose. Did not start since she was on Linzess and wanted to check with [place] first.   Sometimes will go 3-4 days without BM. Was doing good until started on the Calcium. Will take some sennakot after a few days to help with. Decreased exercise. Drinking a lot of water. Had the flu in March.  No  upper GI symptoms.  No melena or rectal bleeding.  Her expense for Linzess is $65 for 90-day supply.  $1300 applies to her insurance.       Current Outpatient Medications  Medication Sig Dispense Refill  . ALPRAZolam (XANAX) 0.5 MG tablet Take 0.5 mg by mouth at bedtime.     Marland Kitchen aspirin 81 MG tablet Take 81 mg by mouth daily.    . Calcium Carbonate-Vit D-Min (CALCIUM 1200 PO) Take 1 tablet by mouth daily.     . cetirizine (ZYRTEC) 10 MG tablet Take 10 mg by mouth at bedtime.     . Cholecalciferol (VITAMIN D3) 1000 units CAPS Take 1,000 Units by mouth daily.    . clopidogrel (PLAVIX) 75 MG tablet Take 1 tablet (75 mg total) by mouth daily. 90 tablet 3  . DENTA 5000 PLUS 1.1 % CREA dental cream Place 1 application onto teeth at bedtime.   4  . diphenhydrAMINE (BENADRYL) 12.5 MG/5ML liquid Take 12.5 mg by mouth 4 (four) times daily as needed for allergies.    . DULoxetine (CYMBALTA) 60 MG capsule TAKE ONE CAPSULE BY MOUTH DAILY 90 capsule 3  . FLUoxetine (PROZAC) 20 MG capsule Take 20 mg by mouth daily.      . fluticasone (FLONASE) 50 MCG/ACT nasal spray Place 2 sprays into both nostrils daily as needed for allergies.     Marland Kitchen gabapentin (NEURONTIN) 600 MG tablet Take 1,200 mg by mouth at bedtime.     Marland Kitchen guaiFENesin (MUCINEX) 600 MG 12 hr tablet Take 600 mg by  mouth 2 (two) times daily as needed for cough or to loosen phlegm.    . ibandronate (BONIVA) 150 MG tablet Take 150 mg by mouth every 30 (thirty) days. Take in the morning with a full glass of water, on an empty stomach, and do not take anything else by mouth or lie down for the next 30 min.    . IncobotulinumtoxinA (XEOMIN IM) Inject 500 Units into the muscle every 3 (three) months.    . levothyroxine (SYNTHROID, LEVOTHROID) 75 MCG tablet Take 75 mcg by mouth daily before breakfast.    . linaclotide (LINZESS) 290 MCG CAPS capsule Take 290 mcg by mouth daily before breakfast.     . Menthol (ICY HOT) 5 % PTCH Apply 1 patch topically at bedtime.     . nitroGLYCERIN (NITROSTAT) 0.4 MG SL tablet Place 0.4 mg under the tongue every 5 (five) minutes as needed for chest pain.    . NUCYNTA 50 MG tablet Take 50 mg by mouth 4 (four) times daily as needed for severe pain.  0  . NUCYNTA ER 50 MG 12 hr tablet Take 50 mg by mouth every 12 (twelve) hours.   0  . pantoprazole (PROTONIX) 40 MG tablet Take 40 mg by mouth daily as needed (for acid reflux).     . ranolazine (RANEXA) 500 MG 12 hr tablet Take 1 tablet (500 mg total) by mouth daily. 90 tablet 3  . rOPINIRole (REQUIP) 2 MG tablet Take 2 mg by mouth at bedtime.     . simvastatin (ZOCOR) 40 MG tablet Take 20 mg by mouth at bedtime.      No current facility-administered medications for this visit.               ROS:  General: Negative for anorexia, weight loss, fever, chills, fatigue, weakness. Eyes: Negative for vision changes.  ENT: Negative for hoarseness, difficulty swallowing , nasal congestion. CV: Negative for chest pain, angina, palpitations, dyspnea on exertion, peripheral edema.  Respiratory: Negative for dyspnea at rest, dyspnea on exertion, cough, sputum, wheezing.  GI: See history of present illness. GU:  Negative for dysuria, hematuria, urinary incontinence, urinary frequency, nocturnal urination.  MS: Negative for joint pain, low back pain.  Derm: Negative for rash or itching.  Neuro: Negative for weakness, abnormal sensation, seizure, frequent headaches, memory loss, confusion.  Psych: Negative for anxiety, depression, suicidal ideation, hallucinations.  Endo: Negative for unusual weight change.  Heme: Negative for bruising or bleeding. Allergy: Negative for rash or hives.   Observations/Objective: Pleasant well-nourished well-developed.  No acute distress.  Assessment and Plan: Pleasant 68 year old female with history of drug-induced constipation/chronic constipation.  Has a bowel movement about every 3 days on Linzess.  She would like to go more often.  Her  pain clinic doctor provided her with prescription for Amitiza but she never had it filled.  She would like to complete her current Linzess prescription before making any changes.  She will try taking with a small snack to increase efficacy.  Would consider trying Amitiza 24 mcg twice daily in the future when she is ready.  She will call and let me know.  We will see her back in 6 months.  Follow Up Instructions:    I discussed the assessment and treatment plan with the patient. The patient was provided an opportunity to ask questions and all were answered. The patient agreed with the plan and demonstrated an understanding of the instructions. AVS mailed to patient's home address.  The patient was advised to call back or seek an in-person evaluation if the symptoms worsen or if the condition fails to improve as anticipated.  I provided 25 minutes of virtual face-to-face time during this encounter.   Neil Crouch, PA-C

## 2018-11-22 NOTE — Progress Notes (Signed)
CC'ED TO PCP 

## 2018-11-28 ENCOUNTER — Ambulatory Visit: Payer: Self-pay | Admitting: Neurology

## 2018-12-06 DIAGNOSIS — Z79891 Long term (current) use of opiate analgesic: Secondary | ICD-10-CM | POA: Diagnosis not present

## 2018-12-06 DIAGNOSIS — G894 Chronic pain syndrome: Secondary | ICD-10-CM | POA: Diagnosis not present

## 2018-12-06 DIAGNOSIS — G890 Central pain syndrome: Secondary | ICD-10-CM | POA: Diagnosis not present

## 2018-12-06 DIAGNOSIS — M542 Cervicalgia: Secondary | ICD-10-CM | POA: Diagnosis not present

## 2018-12-06 DIAGNOSIS — Z79899 Other long term (current) drug therapy: Secondary | ICD-10-CM | POA: Diagnosis not present

## 2018-12-12 ENCOUNTER — Telehealth: Payer: Self-pay | Admitting: *Deleted

## 2018-12-12 NOTE — Telephone Encounter (Signed)
Spoke with patient for the follow up visit in the [place] 4 research trial.  No cos, aes or saes to report. Good conversation with the patient and her husband.  Stellar visit performed and will call subject when studies open back up to schedule next appointment.  Office and desk numbers provided.

## 2018-12-28 NOTE — Telephone Encounter (Signed)
Patient insurance gave [place] an approval for Dysport, this is the preferred medication on her plan. Please verify this is ok and what unit dosage you would like for apt.

## 2018-12-28 NOTE — Telephone Encounter (Signed)
It is Ok to use dysport, can we have dysort 500 units x3 for her next injection

## 2018-12-28 NOTE — Telephone Encounter (Signed)
I called the patients insurance to check coverage and pre cert requirements. The 646 code did not require auth but the J-code did. I called the pre cert line and started a request [id number].    Pre certification [phone] fax-[phone].   Patients insurance states that Dysport is the preferred drug. The patient needs to try Dysport before trying Xeomin. We did the authorization for Dysport and it was approved [id number] (12/28/19) no max on units.

## 2018-12-29 NOTE — Telephone Encounter (Signed)
I called and spoke with the patient to remind her of her apt. I went over check in protocol and mask requirements. We also scheduled her 3 month follow up apt. DW  °

## 2018-12-29 NOTE — Telephone Encounter (Signed)
Noted, thank you. DW  °

## 2018-12-30 ENCOUNTER — Telehealth: Payer: Self-pay | Admitting: Neurology

## 2018-12-30 NOTE — Telephone Encounter (Signed)
Pt would like to know if her medication has come in. States she has not heard from the Pharmacy.

## 2019-01-03 ENCOUNTER — Other Ambulatory Visit: Payer: Self-pay

## 2019-01-03 ENCOUNTER — Ambulatory Visit: Payer: Medicare HMO | Admitting: Neurology

## 2019-01-03 ENCOUNTER — Encounter: Payer: Self-pay | Admitting: Neurology

## 2019-01-03 VITALS — BP 132/78 | HR 72 | Temp 97.8°F | Ht 62.0 in | Wt 140.0 lb

## 2019-01-03 DIAGNOSIS — G894 Chronic pain syndrome: Secondary | ICD-10-CM | POA: Diagnosis not present

## 2019-01-03 DIAGNOSIS — G890 Central pain syndrome: Secondary | ICD-10-CM | POA: Diagnosis not present

## 2019-01-03 DIAGNOSIS — Z79899 Other long term (current) drug therapy: Secondary | ICD-10-CM | POA: Diagnosis not present

## 2019-01-03 DIAGNOSIS — Z79891 Long term (current) use of opiate analgesic: Secondary | ICD-10-CM | POA: Diagnosis not present

## 2019-01-03 DIAGNOSIS — I69354 Hemiplegia and hemiparesis following cerebral infarction affecting left non-dominant side: Secondary | ICD-10-CM | POA: Diagnosis not present

## 2019-01-03 DIAGNOSIS — M542 Cervicalgia: Secondary | ICD-10-CM | POA: Diagnosis not present

## 2019-01-03 MED ORDER — ABOBOTULINUMTOXINA 500 UNITS IM SOLR: 1500.0000 [IU] | Freq: Once | INTRAMUSCULAR | Status: DC

## 2019-01-03 NOTE — Progress Notes (Signed)
[facility]  CC:  Spastic hemiplegia  ALMADELIA Newton is a 68  y.o. Right-handed Caucasian female, return for EMG guided Botox injection for her left spastic hemiparesis,  She suffered stroke in 1999, with residual left spastic hemiparesis, upper extremity more than leg, left visual field cut  She previously was enrolled in Botox research study for spastic upper extremity at [facility] by Dr. Marya Fossa, received EMG guided Botox injection in 2010 for about 2 years, with good benefit, but has difficulty with insurance  She began to receive Botox injection through our clinic by Dr. Hosie Poisson since May 2015,150 units total to left upper extremity, which has been very helpful  She is return for continued EMG guided Botox injection, at baseline, she can ambulate with a left AFO, but without assistance,  Profound left upper extremity spasticity, achiness, chronic pain, persistent left wrist flexion, finger flexion,  She also has a history of coronary artery disease in 2007, taking Plavix, and Aggrenox, exercise regularly, has a personal trainer  UPDATE March 2nd 2016: She received 300 units in Jun 13 2014, to left upper extremity, which has been very helpful, she did not notice significant side effect. The injection help her open her left hand better, no significant side effect noticed.  UPDATE June 8th 2016: She received 300 units of Botox A in March second 2016, responded very well, she was able to relax her left hand better, she ambulate without assistance with spastic left hemiparesis, complains of left facial, left foot pain, wants the injection to emphasize on her left finger flexion wrist flexion today.  UPDATE Sep 28th 2016: She responded to previous Botox injection very well in June, she wants to emphasize on her left upper extremity at this time, complains forceful finger contraction, flexion, elbow flexion. Only mild left shoulder pain with passive movement.   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 Patient is concerned about potential side effect of weakness.  Update May 21 2016: She responded very well to previous injection in July, only wants to receive injection for spastic left upper extremity, she had a pair of left wrist/finger splint,  Update September 09 2016: She is now using a left wrist splint, which has been helpful,  UPDATE Dec 10 2016: She responded well to previous injection no significant side effect noticed,   UPDATE Apr 14 2017: She wear left wrist pain, denies significant side effect, complains of left-sided neuropathic pain, despite Prozac 20 mg, Nucynta 50 mg, gabapentin 600 mg twice a day,  UPDATE Jul 14 2017: She responded very well to previous injection in September 2018,  UPDATE October 13 2017: She responded very well to previous Botox injection  UPDATE January 26 2018: She complains of left leg weakness was injection, wants to focus injection on left upper extremity,  UPDATE May 11 2018: She responded well to previous injection  Update August 11, 2018: She responded well to previous injection, no  significant side effect noted  UPDATE January 03 2019: She responded very well to previous injection  Social History   Socioeconomic History  . Marital status: Married    Spouse name: Not on file  .  Number of children: Not on file  . Years of education: Not on file  . Highest education level: Not on file  Occupational History  . Occupation: disabled  Social Needs  . Financial resource strain: Not on file  . Food insecurity:    Worry: Not on file    Inability: Not on file  . Transportation needs:    Medical: Not on file    Non-medical: Not on file  Tobacco Use  . Smoking status: Former Games developer  . Smokeless tobacco: Never Used  . Tobacco comment: Quit in 2005  Substance and Sexual Activity  . Alcohol use: Yes    Comment: social  . Drug use: No  . Sexual activity: Not Currently  Lifestyle  . Physical activity:    Days per week: Not on file    Minutes per session: Not on file  . Stress: Not on file  Relationships  . Social connections:    Talks on phone: Not on file    Gets together: Not on file    Attends religious service: Not on file    Active member of club or organization: Not on file    Attends meetings of clubs or organizations: Not on file    Relationship status: Not on file  . Intimate partner violence:    Fear of current or ex partner: Not on file    Emotionally abused: Not on file    Physically abused: Not on file    Forced sexual activity: Not on file  Other Topics Concern  . Not on file  Social History Narrative   Married, no children   Right handed   12th grade   1 cup daily    Family History  Problem Relation Age of Onset  . Arrhythmia Father        PPM  . Colon cancer Neg Hx     Past Medical History:  Diagnosis Date  . AMI (acute myocardial infarction) (HCC) 2007   failed PCI to the distal LAD - managed medically  . Chronic pain   . Complication of anesthesia   . CVA (cerebral vascular accident) (HCC) 1999  . Degenerative disc disease, cervical   . Hypothyroidism   . PONV (postoperative nausea and vomiting)   . Unstable angina [facility])     Past Surgical History:  Procedure Laterality Date  . COLONOSCOPY WITH PROPOFOL N/A 05/24/2018    Procedure: COLONOSCOPY WITH PROPOFOL;  Surgeon: West Bali, MD;  Location: [facility];  Service: Endoscopy;  Laterality: N/A;  12:45pm  . ENDOMETRIAL ABLATION  3/06  . left ankle surgery      Current Outpatient Medications  Medication Sig Dispense Refill  . ALPRAZolam (XANAX) 0.5 MG tablet Take 0.5 mg by mouth at bedtime.     Marland Kitchen aspirin 81 MG tablet Take 81 mg by mouth daily.    . Calcium Carbonate-Vit D-Min (CALCIUM 1200 PO) Take 1 tablet by mouth daily.     . cetirizine (ZYRTEC) 10 MG tablet Take 10 mg by mouth at bedtime.     . Cholecalciferol (VITAMIN D3) 1000 units CAPS Take 1,000 Units by mouth daily.    . clopidogrel (PLAVIX) 75 MG tablet Take 1 tablet (75 mg total) by mouth daily. 90 tablet 3  .  DENTA 5000 PLUS 1.1 % CREA dental cream Place 1 application onto teeth at bedtime.   4  . diphenhydrAMINE (BENADRYL) 12.5 MG/5ML liquid Take 12.5 mg by mouth 4 (four) times daily as needed for allergies.    . DULoxetine (CYMBALTA) 60 MG capsule TAKE ONE CAPSULE BY MOUTH DAILY 90 capsule 3  . FLUoxetine (PROZAC) 20 MG capsule Take 20 mg by mouth daily.      Marland Kitchen. gabapentin (NEURONTIN) 600 MG tablet Take 1,200 mg by mouth at bedtime.     Marland Kitchen. guaiFENesin (MUCINEX) 600 MG 12 hr tablet Take 600 mg by mouth 2 (two) times daily as needed for cough or to loosen phlegm.    . ibandronate (BONIVA) 150 MG tablet Take 150 mg by mouth every 30 (thirty) days. Take in the morning with a full glass of water, on an empty stomach, and do not take anything else by mouth or lie down for the next 30 min.    . IncobotulinumtoxinA (XEOMIN IM) Inject 500 Units into the muscle every 3 (three) months.    . levothyroxine (SYNTHROID, LEVOTHROID) 75 MCG tablet Take 75 mcg by mouth daily before breakfast.    . linaclotide (LINZESS) 290 MCG CAPS capsule Take 290 mcg by mouth daily before breakfast.     . Menthol (ICY HOT) 5 % PTCH Apply 1 patch topically at bedtime.    . nitroGLYCERIN (NITROSTAT) 0.4 MG SL tablet Place  0.4 mg under the tongue every 5 (five) minutes as needed for chest pain.    . NUCYNTA 50 MG tablet Take 50 mg by mouth 4 (four) times daily as needed for severe pain.  0  . NUCYNTA ER 50 MG 12 hr tablet Take 50 mg by mouth every 12 (twelve) hours.   0  . pantoprazole (PROTONIX) 40 MG tablet Take 40 mg by mouth daily as needed (for acid reflux).     . ranolazine (RANEXA) 500 MG 12 hr tablet Take 1 tablet (500 mg total) by mouth daily. 90 tablet 3  . rOPINIRole (REQUIP) 2 MG tablet Take 2 mg by mouth at bedtime.     . simvastatin (ZOCOR) 40 MG tablet Take 20 mg by mouth at bedtime.     . fluticasone (FLONASE) 50 MCG/ACT nasal spray Place 2 sprays into both nostrils daily as needed for allergies.      No current facility-administered medications for this visit.     Allergies as of 01/03/2019 - Review Complete 01/03/2019  Allergen Reaction Noted  . Morphine Other (See Comments) 01/01/2009  . Zolpidem tartrate Other (See Comments) 01/01/2009  . Penicillins Rash and Other (See Comments) 01/01/2009    Vitals: BP 132/78   Pulse 72   Temp 97.8 F (36.6 C)   Ht 5\' 2"  (1.575 m)   Wt 140 lb (63.5 kg)   BMI 25.61 kg/m  Last Weight:  Wt Readings from Last 1 Encounters:  01/03/19 140 lb (63.5 kg)   Last Height:   Ht Readings from Last 1 Encounters:  01/03/19 5\' 2"  (1.575 m)   PHYSICAL EXAMINATOINS:     Mentation: Alert oriented to time, place, history taking, and causual conversation, left lower face weakness  Cranial nerve II-XII: Pupils were equal round reactive to light extraocular movements were full, left visual field cut.   Left lower face weakness. hearing was intact to finger rubbing bilaterally. Uvula tongue midline.  head turning and shoulder shrug and were normal and symmetric.Tongue protrusion into cheek strength was normal.  Motor: spastic  left hemiparesis,  Wearing left AFO, left lower extremity moderate spasticity, left hip flexion 4, knee flexion 4, extension 5, Profound  left upper extremity spasticity, left shoulder adduction, internal rotation.left elbow flexion, pronation, with passive movement, mild fixed contraction of left elbow, maximum 175, left wrist forceful flexion, finger flexion,thumb in position, even with forceful movement, she could not achieve full extension of her left finger  Gait: Rising up from seated position by pushing on chair arm,spastic left hemi-circumferential gait.  Deep tendon reflexes: hyperreflexia of left side   Assessment and plan:   68 years old right-handed Caucasian female, status post stroke, with spastic left hemiparesis since 1999, responded very well to previous EMG guided Botox injection, return to clinic for repeat injection  Under electric stimulation, 500 units of Dysport A x3  was injected into left upper extremity muscles (500 units dysport/2.5cc of NS)  Left pronator teres 0.5cc x3=1.5 cc Left flexor digitorum profundus  0.5ccx2=1.0 cc Left opponents 0.5 cc Left brachialis 0.5cc x 4=.2.0 cc  Left palmaris longus 0.5cc Left pectoralis major 0.5ccx4=2.0 cc     [name], M.D. Ph.D.  [facility] [address] [place], [place] [address] Phone: [phone] Fax:      [phone]

## 2019-01-03 NOTE — Progress Notes (Signed)
**  Dysport 500 units x 3, NDC [address]-7, Lot [id number], Exp 08/03/2019, office supply.//mck,rn**

## 2019-01-04 NOTE — Telephone Encounter (Signed)
I was on vacation this patient has already been seen.

## 2019-01-05 ENCOUNTER — Telehealth: Payer: Self-pay | Admitting: *Deleted

## 2019-01-05 NOTE — Telephone Encounter (Signed)
Virtual Visit Pre-Appointment Phone Call  "(Name), I am calling you today to discuss your upcoming appointment. We are currently trying to limit exposure to the virus that causes COVID-19 by seeing patients at home rather than in the office."  1. "What is the BEST phone number to call the day of the visit?" - include this in appointment notes  2. "Do you have or have access to (through a family member/friend) a smartphone with video capability that we can use for your visit?" a. If yes - list this number in appt notes as "cell" (if different from BEST phone #) and list the appointment type as a VIDEO visit in appointment notes b. If no - list the appointment type as a PHONE visit in appointment notes  3. Confirm consent - "In the setting of the current Covid19 crisis, you are scheduled for a (phone or video) visit with your provider on (Tuesday, June 9) at (11:45 am ).  Just as we do with many in-office visits, in order for you to participate in this visit, we must obtain consent.  If you'd like, I can send this to your mychart (if signed up) or email for you to review.  Otherwise, I can obtain your verbal consent now.  All virtual visits are billed to your insurance company just like a normal visit would be.  By agreeing to a virtual visit, we'd like you to understand that the technology does not allow for your provider to perform an examination, and thus may limit your provider's ability to fully assess your condition. If your provider identifies any concerns that need to be evaluated in person, we will make arrangements to do so.  Finally, though the technology is pretty good, we cannot assure that it will always work on either your or our end, and in the setting of a video visit, we may have to convert it to a phone-only visit.  In either situation, we cannot ensure that we have a secure connection.  Are you willing to proceed?" STAFF: Did the patient verbally acknowledge consent to telehealth  visit? Document YES/NO here: YES.  4. Advise patient to be prepared - "Two hours prior to your appointment, go ahead and check your blood pressure, pulse, oxygen saturation, and your weight (if you have the equipment to check those) and write them all down. When your visit starts, your provider will ask you for this information. If you have an Apple Watch or Kardia device, please plan to have heart rate information ready on the day of your appointment. Please have a pen and paper handy nearby the day of the visit as well."  5. Give patient instructions for MyChart download to smartphone OR [email or web address] as below if video visit (depending on what platform provider is using)  6. Inform patient they will receive a phone call 15 minutes prior to their appointment time (may be from unknown caller ID) so they should be prepared to answer    TELEPHONE CALL NOTE  [name] has been deemed a candidate for a follow-up tele-health visit to limit community exposure during the Covid-19 pandemic. I spoke with the patient via phone to ensure availability of phone/video source, confirm preferred email & phone number, and discuss instructions and expectations.  I reminded [name] to be prepared with any vital sign and/or heart rhythm information that could potentially be obtained via home monitoring, at the time of her visit. I reminded [name] to expect a  phone call prior to her visit.  [name] 01/05/2019 9:03 AM   INSTRUCTIONS FOR DOWNLOADING THE MYCHART APP TO SMARTPHONE  - The patient must first make sure to have activated MyChart and know their login information - If Apple, go to [employer] Store and type in MyChart in the search bar and download the app. If Android, ask patient to go to [employer] Play Store and type in Krem[facility]ingMyChart in the search bar and download the app. The app is free but as with any other app downloads, their phone may require them to verify saved payment  information or Apple/Android password.  - The patient will need to then log into the app with their MyChart username and password, and select  as their healthcare provider to link the account. When it is time for your visit, go to the MyChart app, find appointments, and click Begin Video Visit. Be sure to Select Allow for your device to access the Microphone and Camera for your visit. You will then be connected, and your provider will be with you shortly.  **If they have any issues connecting, or need assistance please contact MyChart service desk [phone])**  **If using a computer, in order to ensure the best quality for their visit they will need to use either of the following Internet Browsers: [employer] Edge, or Google Chrome**  IF USING DOXIMITY or DOXY.ME - The patient will receive a link just prior to their visit by text.     FULL LENGTH CONSENT FOR TELE-HEALTH VISIT   I hereby voluntarily request, consent and authorize [facility] and its employed or contracted physicians, physician assistants, nurse practitioners or other licensed health care professionals (the Practitioner), to provide me with telemedicine health care services (the "Services") as deemed necessary by the treating Practitioner. I acknowledge and consent to receive the Services by the Practitioner via telemedicine. I understand that the telemedicine visit will involve communicating with the Practitioner through live audiovisual communication technology and the disclosure of certain medical information by electronic transmission. I acknowledge that I have been given the opportunity to request an in-person assessment or other available alternative prior to the telemedicine visit and am voluntarily participating in the telemedicine visit.  I understand that I have the right to withhold or withdraw my consent to the use of telemedicine in the course of my care at any time, without affecting my right  to future care or treatment, and that the Practitioner or I may terminate the telemedicine visit at any time. I understand that I have the right to inspect all information obtained and/or recorded in the course of the telemedicine visit and may receive copies of available information for a reasonable fee.  I understand that some of the potential risks of receiving the Services via telemedicine include:  Marland Kitchen. Delay or interruption in medical evaluation due to technological equipment failure or disruption; . Information transmitted may not be sufficient (e.g. poor resolution of images) to allow for appropriate medical decision making by the Practitioner; and/or  . In rare instances, security protocols could fail, causing a breach of personal health information.  Furthermore, I acknowledge that it is my responsibility to provide information about my medical history, conditions and care that is complete and accurate to the best of my ability. I acknowledge that Practitioner's advice, recommendations, and/or decision may be based on factors not within their control, such as incomplete or inaccurate data provided by me or distortions of diagnostic images or specimens that may result  from electronic transmissions. I understand that the practice of medicine is not an exact science and that Practitioner makes no warranties or guarantees regarding treatment outcomes. I acknowledge that I will receive a copy of this consent concurrently upon execution via email to the email address I last provided but may also request a printed copy by calling the office of [place].    I understand that my insurance will be billed for this visit.   I have read or had this consent read to me. . I understand the contents of this consent, which adequately explains the benefits and risks of the Services being provided via telemedicine.  . I have been provided ample opportunity to ask questions regarding this consent and the Services  and have had my questions answered to my satisfaction. . I give my informed consent for the services to be provided through the use of telemedicine in my medical care  By participating in this telemedicine visit I agree to the above.

## 2019-01-09 ENCOUNTER — Ambulatory Visit: Payer: Medicare HMO | Admitting: Nurse Practitioner

## 2019-01-09 NOTE — Progress Notes (Signed)
Telehealth Visit     Virtual Visit via Video Note   This visit type was conducted due to national recommendations for restrictions regarding the COVID-19 Pandemic (e.g. social distancing) in an effort to limit this patient's exposure and mitigate transmission in our community.  Due to her co-morbid illnesses, this patient is at least at moderate risk for complications without adequate follow up.  This format is felt to be most appropriate for this patient at this time.  All issues noted in this document were discussed and addressed.  A limited physical exam was performed with this format.  Please refer to the patient's chart for her consent to telehealth for [facility].   Evaluation Performed:  Follow-up visit  This visit type was conducted due to national recommendations for restrictions regarding the COVID-19 Pandemic (e.g. social distancing).  This format is felt to be most appropriate for this patient at this time.  All issues noted in this document were discussed and addressed.  No physical exam was performed (except for noted visual exam findings with Video Visits).  Please refer to the patient's chart (MyChart message for video visits and phone note for telephone visits) for the patient's consent to telehealth for [facility].  Date:  01/10/2019   ID:  [name], DOB 07-03-1951, MRN [id number]  Patient Location:  Home  Provider location:   Home  PCP:  Selinda Flavin, MD  Cardiologist:  Tyrone Sage & No primary care provider on file.  Electrophysiologist:  None   Chief Complaint:  Follow up visit.   History of Present Illness:    [name] is a 68 y.o. female who presents via audio/video conferencing for a telehealth visit today.  She is a former patient of Dr. Vern Claude - now following by me.   She hasa history ofCAD with past MI in 2007 and had failed PCI to the distal LAD - managed medically -hasused"PRN" Ranexa since that time.On chronic DAPT with  aspirin/Plavix.Other issues include prior CVA from 1999(on aspirin/Plavix), hypothyroidism, HLD and GERD.  When seen in January of 2017 washaving some chest pain. Her exercise tolerance was stable. We did get her Myoview updated and this looked ok.Sheremains mostly limited bysignificant nerve pain fromaremote stroke but she continues to "press on"in quite an amazing fashion.Previously used  "prn" Ranexa as instructed by Dr. Daleen Squibb in the remote past.   Candie Echevaria in July of 2019 for a work in visit -having lots of symptoms . Husband noted a definite change with more fatigue and more complaints of chest pain. 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 Elected to start daily Ranexa versus proceeding with cardiac cath - she has not been able to tolerate BID dosing per her report due to "terrific headache".On return she was much improved and we elected to proceed on with medical therapy. We did have to cut her Zocor back and she postponed her colonoscopy.Remained more limited by her nerve pain. We elected to continue with medical therapy. Last visit with me was in December and she was happy with how she was doing. She was going into a research study for her lipids.   The patient does not have symptoms concerning for COVID-19 infection (fever, chills, cough, or new shortness of breath).   Seen today via Doximity video. She has consented for this visit. Doing well. Her husband Gerlene Burdock is there as well. No recent labs noted. She remains in a research study for her lipids -  also on 1/2 dose Zocor - she has some aching in her legs. No chest pain. Feels good on the Ranexa once a day. No NTG use. Very down - can't get out - can't go to the gym, etc. Her trainer may be coming to her house starting next week - I think this will help. Still with lots of nerve pain - this remains a limiting factor for her.   Past Medical History:  Diagnosis Date   AMI  (acute myocardial infarction) ([place]) 2007   failed PCI to the distal LAD - managed medically   Chronic pain    Complication of anesthesia    CVA (cerebral vascular accident) ([place]) 1999   Degenerative disc disease, cervical    Hypothyroidism    PONV (postoperative nausea and vomiting)    Unstable angina ([place])    Past Surgical History:  Procedure Laterality Date   COLONOSCOPY WITH PROPOFOL N/A 05/24/2018   Procedure: COLONOSCOPY WITH PROPOFOL;  Surgeon: Danie Binder, MD;  Location[facility];  Service: Endoscopy;  Laterality: N/A;  12:45pm   ENDOMETRIAL ABLATION  3/06   left ankle surgery       Current Meds  Medication Sig   ALPRAZolam (XANAX) 0.5 MG tablet Take 0.5 mg by mouth at bedtime.    aspirin 81 MG tablet Take 81 mg by mouth daily.   Calcium Carbonate-Vit D-Min (CALCIUM 1200 PO) Take 1 tablet by mouth daily.    cetirizine (ZYRTEC) 10 MG tablet Take 10 mg by mouth at bedtime.    Cholecalciferol (VITAMIN D3) 1000 units CAPS Take 1,000 Units by mouth daily.   clopidogrel (PLAVIX) 75 MG tablet Take 1 tablet (75 mg total) by mouth daily.   DENTA 5000 PLUS 1.1 % CREA dental cream Place 1 application onto teeth at bedtime.    diphenhydrAMINE (BENADRYL) 12.5 MG/5ML liquid Take 12.5 mg by mouth 4 (four) times daily as needed for allergies.   DULoxetine (CYMBALTA) 60 MG capsule TAKE ONE CAPSULE BY MOUTH DAILY   FLUoxetine (PROZAC) 20 MG capsule Take 20 mg by mouth daily.     gabapentin (NEURONTIN) 600 MG tablet Take 1,200 mg by mouth at bedtime.    guaiFENesin (MUCINEX) 600 MG 12 hr tablet Take 600 mg by mouth 2 (two) times daily as needed for cough or to loosen phlegm.   ibandronate (BONIVA) 150 MG tablet Take 150 mg by mouth every 30 (thirty) days. Take in the morning with a full glass of water, on an empty stomach, and do not take anything else by mouth or lie down for the next 30 min.   IncobotulinumtoxinA (XEOMIN IM) Inject 500 Units into the muscle  every 3 (three) months.   levothyroxine (SYNTHROID, LEVOTHROID) 75 MCG tablet Take 75 mcg by mouth daily before breakfast.   linaclotide (LINZESS) 290 MCG CAPS capsule Take 290 mcg by mouth daily before breakfast.    Menthol (ICY HOT) 5 % PTCH Apply 1 patch topically at bedtime.   nitroGLYCERIN (NITROSTAT) 0.4 MG SL tablet Place 0.4 mg under the tongue every 5 (five) minutes as needed for chest pain.   NUCYNTA 50 MG tablet Take 50 mg by mouth 4 (four) times daily as needed for severe pain.   NUCYNTA ER 50 MG 12 hr tablet Take 50 mg by mouth every 12 (twelve) hours.    pantoprazole (PROTONIX) 40 MG tablet Take 40 mg by mouth daily as needed (for acid reflux).    ranolazine (RANEXA) 500 MG 12 hr tablet Take  1 tablet (500 mg total) by mouth daily.   rOPINIRole (REQUIP) 2 MG tablet Take 2 mg by mouth at bedtime.    simvastatin (ZOCOR) 40 MG tablet Take 20 mg by mouth at bedtime.    Current Facility-Administered Medications for the 01/10/19 encounter (Telemedicine) with Rosalio MacadamiaGerhardt, Layaan Mott C, NP  Medication   AbobotulinumtoxinA (DYSPORT) 500 units injection 1,500 Units     Allergies:   Morphine; Zolpidem tartrate; and Penicillins   Social History   Tobacco Use   Smoking status: Former Smoker   Smokeless tobacco: Never Used   Tobacco comment: Quit in 2005  Substance Use Topics   Alcohol use: Yes    Comment: social   Drug use: No     Family Hx: The patient's family history includes Arrhythmia in her father. There is no history of Colon cancer.  ROS:   Please see the history of present illness.   All other systems reviewed are negative except for her chronic nerve pain.    Objective:    Vital Signs:  Ht 5\' 2"  (1.575 m)    Wt 140 lb (63.5 kg)    BMI 25.61 kg/m    Wt Readings from Last 3 Encounters:  01/10/19 140 lb (63.5 kg)  01/03/19 140 lb (63.5 kg)  08/11/18 135 lb (61.2 kg)    Alert female in no acute distress. Not short of breath with conversation. She seems  depressed. Little tearful I think.    Labs/Other Tests and Data Reviewed:    Lab Results  Component Value Date   WBC 7.7 11/17/2017   HGB 13.9 11/17/2017   HCT 41.0 11/17/2017   PLT 307 11/17/2017   GLUCOSE 97 04/19/2018   CHOL 171 04/19/2018   TRIG 78 04/19/2018   HDL 59 04/19/2018   LDLCALC 96 04/19/2018   ALT 11 04/19/2018   AST 17 04/19/2018   NA 138 04/19/2018   K 4.5 04/19/2018   CL 99 04/19/2018   CREATININE 0.83 04/19/2018   BUN 12 04/19/2018   CO2 26 04/19/2018   TSH 0.940 11/17/2017     BNP (last 3 results) No results for input(s): BNP in the last 8760 hours.  ProBNP (last 3 results) No results for input(s): PROBNP in the last 8760 hours.    Prior CV studies:    The following studies were reviewed today:  MyoviewStudy Result1/2017   No diagnostic ST segment changes to indicate ischemia.  Small, mild intensity, lateral apical region of ischemia in the setting of artifact from extracardiac radiotracer uptake and also arms down imaging. No large ischemic territories.  This is a low risk study.  Nuclear stress EF: 74%.     Echo Study Conclusions from 2011 Left ventricle: The cavity size was normal. Systolic function was normal. The estimated ejection fraction was in the range of 55% to 60%. Wall motion was normal; there were no regional wall motion abnormalities.    CARDIAC CATHETERIZATION 2007  HEMODYNAMICS:  1. Left main coronary artery: The left main coronary artery is free of  significant disease.  2. Left anterior descending artery: The left anterior descending artery  gave rise to two diagonal branches and a septal perforator. The LAD was  quite tortuous in its mid portion and then had a 99% occlusion in its  distal portion with TIMI-I flow.  3. Circumflex artery: The circumflex artery gave rise to an atrial branch,  marginal branch, and two posterolateral branches. These vessels were  free of significant disease.   4.  Right coronary artery: The right coronary artery is a moderate size  vessel which gave rise to a clonus branch, right ventricular branch,  posterior descending branch, and a posterolateral branch. This vessel  was free of significant disease.  LEFT VENTRICULOGRAM: The left ventriculogram was obtained in the RAO  projection showed akinesis of the tip of the apex in the inferoapical  segment. The overall wall motion was good with an estimated ejection  fraction of 60%.  Following attempt at PCA there was persistent 99% stenosis in the distal LAD  with TIMI-I flow.  CONCLUSION:  1. Coronary artery disease, status post recent non-ST-elevation myocardial  infarction with 99% stenosis in the distal left anterior descending with  TIMI-I flow, no significant obstruction in the circumflex and right  coronary arteries, and inferoapical wall akinesis.  2. Unsuccessful percutaneous coronary intervention of the lesion in the  distal left anterior descending due to inability to cross with the wire.  DISPOSITION: The patient returned to the postanesthesia care unit for  further observation. I think her outlook should be good on medical therapy.  She has no significant residual coronary disease and overall LV function is  good.  ______________________________  Charlies ConstableBruce Brodie, M.D. [facility]  BB/MEDQ D: 08/25/2005 T: 08/25/2005 Job: [id number]     ASSESSMENT & PLAN:    1. CAD - remote NSTEMI - with unsuccessful PCI to the distal LADback in 2007and no significant residual CADat that time-she has been managed medically since that time.She is on chronic DAPT.She was previously using prn Ranexawhich was changed to every day due to recurrent angina - she has done well with this - no further chest pain. We have continued medical therapy. Previously not wished to proceed with cath due to risk of stroke and she is already debilitated from prior stroke. No changes made today.   2.  HLD -She is on less Zocor due to drug interaction with the Ranexa -she is having lower extremity myalgias - she is going to have a drug holiday for the next 2 weeks. She is also participating in some research study. She may need lab on return.   3. Prior stroke -previously on coumadin and has been on Aggrenox - nowon chronic Plavixand aspirin due to her CAD.She remains  limited by residual nerve pain.She has chronic left sided weakness.Hopefully her trainer will be able to come and see her in her home and get her back to exercising.   4. Chronic nerve pain - on Botox and narcotic therapy.She still does amazingly well in my opinion.   5. COVID-19 Education: The signs and symptoms of COVID-19 were discussed with the patient and how to seek care for testing (follow up with PCP or arrange E-visit).  The importance of social distancing, staying at home, hand hygiene and wearing a mask when out in public were discussed today.  Patient Risk:   After full review of this patient's clinical status, I feel that they are at least moderate risk at this time.  Time:   Today, I have spent 12 minutes with the patient with telehealth technology discussing the above issues.     Medication Adjustments/Labs and Tests Ordered: Current medicines are reviewed at length with the patient today.  Concerns regarding medicines are outlined above.   Tests Ordered: No orders of the defined types were placed in this encounter.   Medication Changes: No orders of the defined types were placed in this encounter.   Disposition:  FU with me in September in  the office.    Patient is agreeable to this plan and will call if any problems develop in the interim.   Avelina LaineSigned, Crescencio Jozwiak, NP  01/10/2019 11:56 AM    [facility]

## 2019-01-10 ENCOUNTER — Other Ambulatory Visit: Payer: Self-pay

## 2019-01-10 ENCOUNTER — Encounter: Payer: Self-pay | Admitting: Nurse Practitioner

## 2019-01-10 ENCOUNTER — Telehealth (INDEPENDENT_AMBULATORY_CARE_PROVIDER_SITE_OTHER): Payer: Medicare HMO | Admitting: Nurse Practitioner

## 2019-01-10 VITALS — Ht 62.0 in | Wt 140.0 lb

## 2019-01-10 DIAGNOSIS — E7800 Pure hypercholesterolemia, unspecified: Secondary | ICD-10-CM

## 2019-01-10 DIAGNOSIS — Z7189 Other specified counseling: Secondary | ICD-10-CM

## 2019-01-10 DIAGNOSIS — I259 Chronic ischemic heart disease, unspecified: Secondary | ICD-10-CM

## 2019-01-10 NOTE — Patient Instructions (Addendum)
After Visit Summary:  We will be checking the following labs today - NONE   Medication Instructions:    Continue with your current medicines.   Ok to hold the Simvastatin/Zocor for the next couple of weeks and see if your legs feel better - let me know. We could try a different cholesterol medicine    If you need a refill on your cardiac medications before your next appointment, please call your pharmacy.     Testing/Procedures To Be Arranged:  N/A  Follow-Up:   See me sometime in September - I'm sorry I can't do 04/11/2019 - I am out on vacation that week. Let's try for a late morning time - come fasting in case we need to do labs.     At [facility], you and your health needs are our priority.  As part of our continuing mission to provide you with exceptional heart care, we have created designated Provider Care Teams.  These Care Teams include your primary Cardiologist (physician) and Advanced Practice Providers (APPs -  Physician Assistants and Nurse Practitioners) who all work together to provide you with the care you need, when you need it.  Special Instructions:  . Stay safe, stay home, wash your hands for at least 20 seconds and wear a mask when out in public.  . It was good to talk with you both today.  Marland Kitchen Keep working hard for me.    Call the [place] office at [phone] if you have any questions, problems or concerns.

## 2019-01-24 ENCOUNTER — Telehealth: Payer: Self-pay | Admitting: Nurse Practitioner

## 2019-01-24 NOTE — Telephone Encounter (Signed)
S/w pt is aware of Lori's recommendation's.  Gave pt fax number today to have labs from Dr. Oletta Lamas office faxed to [place].  After labs are received will discuss alternative statin.

## 2019-01-24 NOTE — Telephone Encounter (Signed)
Pt calling in today to let Cecille Rubin know pt's legs feel better off the simvastatin.  Pt also wanted Cecille Rubin to know pt sees PCP tomorrow to get lab work.  Will send to Cecille Rubin to advise.

## 2019-01-24 NOTE — Telephone Encounter (Signed)
New Message             Patient would like a call back concerning her cholesterol medication. Pls call to advise.

## 2019-01-24 NOTE — Telephone Encounter (Signed)
Ok - lets stay off the Simvastatin for now - get labs tomorrow and then send to me for review. We can try an alternate agent.   Cecille Rubin

## 2019-01-25 DIAGNOSIS — E039 Hypothyroidism, unspecified: Secondary | ICD-10-CM | POA: Diagnosis not present

## 2019-01-25 DIAGNOSIS — E7800 Pure hypercholesterolemia, unspecified: Secondary | ICD-10-CM | POA: Diagnosis not present

## 2019-01-25 DIAGNOSIS — G2581 Restless legs syndrome: Secondary | ICD-10-CM | POA: Diagnosis not present

## 2019-02-01 DIAGNOSIS — E782 Mixed hyperlipidemia: Secondary | ICD-10-CM | POA: Diagnosis not present

## 2019-02-01 DIAGNOSIS — I639 Cerebral infarction, unspecified: Secondary | ICD-10-CM | POA: Diagnosis not present

## 2019-02-01 DIAGNOSIS — G2581 Restless legs syndrome: Secondary | ICD-10-CM | POA: Diagnosis not present

## 2019-02-01 DIAGNOSIS — Z0000 Encounter for general adult medical examination without abnormal findings: Secondary | ICD-10-CM | POA: Diagnosis not present

## 2019-02-01 DIAGNOSIS — Z6824 Body mass index (BMI) 24.0-24.9, adult: Secondary | ICD-10-CM | POA: Diagnosis not present

## 2019-02-01 DIAGNOSIS — E7800 Pure hypercholesterolemia, unspecified: Secondary | ICD-10-CM | POA: Diagnosis not present

## 2019-02-01 DIAGNOSIS — E039 Hypothyroidism, unspecified: Secondary | ICD-10-CM | POA: Diagnosis not present

## 2019-02-02 ENCOUNTER — Telehealth: Payer: Self-pay

## 2019-02-02 MED ORDER — ROSUVASTATIN CALCIUM 5 MG PO TABS: 5.0000 mg | ORAL_TABLET | Freq: Every day | ORAL | 3 refills | Status: DC

## 2019-02-02 NOTE — Telephone Encounter (Signed)
-----   Message from Burtis Junes, NP sent at 02/02/2019  2:42 PM EDT ----- Please call - let her know that I have seen her labs.  Lipids are not good - suspect she remains off Zocor - was given a drug holiday in June due to myalgias.  If her myalgias have improved - would she consider low dose Crestor? - if so - start Crestor 5 mg a day - needs lipids and LFTs in 6 weeks please - can do with her PCP if she wishes.  Her other labs are ok.  No other changes recommended.

## 2019-02-02 NOTE — Telephone Encounter (Signed)
Notes recorded by Frederik Schmidt, RN on 02/02/2019 at 3:39 PM EDT  The patient has been notified of the result and verbalized understanding. She will stop Zocor and start Crestor 5 mg Daily. She will get FLP and LFT in 6 weeks at PCP. All questions (if any) were answered.  Frederik Schmidt, RN 02/02/2019 3:38 PM

## 2019-02-07 DIAGNOSIS — R51 Headache: Secondary | ICD-10-CM | POA: Diagnosis not present

## 2019-02-07 DIAGNOSIS — G894 Chronic pain syndrome: Secondary | ICD-10-CM | POA: Diagnosis not present

## 2019-02-07 DIAGNOSIS — G890 Central pain syndrome: Secondary | ICD-10-CM | POA: Diagnosis not present

## 2019-02-07 DIAGNOSIS — M542 Cervicalgia: Secondary | ICD-10-CM | POA: Diagnosis not present

## 2019-02-10 ENCOUNTER — Telehealth: Payer: Self-pay | Admitting: Nurse Practitioner

## 2019-02-10 DIAGNOSIS — Z79899 Other long term (current) drug therapy: Secondary | ICD-10-CM

## 2019-02-10 DIAGNOSIS — E7800 Pure hypercholesterolemia, unspecified: Secondary | ICD-10-CM

## 2019-02-10 DIAGNOSIS — R079 Chest pain, unspecified: Secondary | ICD-10-CM

## 2019-02-10 NOTE — Telephone Encounter (Signed)
Lab orders faxed to [facility] in [place] [phone] Per pt request for fasting lipid panel and LFT's. Pt to have drawn 03/15/19.   Office [phone]

## 2019-02-10 NOTE — Telephone Encounter (Signed)
  Patient would like an order for her lab work to be sent over to [facility] in [place].

## 2019-02-15 DIAGNOSIS — M79672 Pain in left foot: Secondary | ICD-10-CM | POA: Diagnosis not present

## 2019-02-15 DIAGNOSIS — L89892 Pressure ulcer of other site, stage 2: Secondary | ICD-10-CM | POA: Diagnosis not present

## 2019-03-07 DIAGNOSIS — G890 Central pain syndrome: Secondary | ICD-10-CM | POA: Diagnosis not present

## 2019-03-07 DIAGNOSIS — G894 Chronic pain syndrome: Secondary | ICD-10-CM | POA: Diagnosis not present

## 2019-03-07 DIAGNOSIS — R51 Headache: Secondary | ICD-10-CM | POA: Diagnosis not present

## 2019-03-07 DIAGNOSIS — M542 Cervicalgia: Secondary | ICD-10-CM | POA: Diagnosis not present

## 2019-03-17 DIAGNOSIS — R079 Chest pain, unspecified: Secondary | ICD-10-CM | POA: Diagnosis not present

## 2019-03-17 DIAGNOSIS — E7800 Pure hypercholesterolemia, unspecified: Secondary | ICD-10-CM | POA: Diagnosis not present

## 2019-03-17 DIAGNOSIS — Z79899 Other long term (current) drug therapy: Secondary | ICD-10-CM | POA: Diagnosis not present

## 2019-04-04 DIAGNOSIS — M542 Cervicalgia: Secondary | ICD-10-CM | POA: Diagnosis not present

## 2019-04-04 DIAGNOSIS — G894 Chronic pain syndrome: Secondary | ICD-10-CM | POA: Diagnosis not present

## 2019-04-04 DIAGNOSIS — R51 Headache: Secondary | ICD-10-CM | POA: Diagnosis not present

## 2019-04-04 DIAGNOSIS — G890 Central pain syndrome: Secondary | ICD-10-CM | POA: Diagnosis not present

## 2019-04-11 ENCOUNTER — Encounter: Payer: Self-pay | Admitting: Neurology

## 2019-04-11 ENCOUNTER — Ambulatory Visit: Payer: Medicare HMO | Admitting: Neurology

## 2019-04-11 ENCOUNTER — Other Ambulatory Visit: Payer: Self-pay

## 2019-04-11 VITALS — BP 118/77 | HR 79 | Temp 97.5°F | Ht 62.0 in | Wt 149.8 lb

## 2019-04-11 DIAGNOSIS — I69354 Hemiplegia and hemiparesis following cerebral infarction affecting left non-dominant side: Secondary | ICD-10-CM

## 2019-04-11 NOTE — Progress Notes (Signed)
**  Dysport 500 units x 3 vials, [place] [address]-5, Lot [id number], Exp 12/01/2019, office supply.//mck,rn**

## 2019-04-12 DIAGNOSIS — I69354 Hemiplegia and hemiparesis following cerebral infarction affecting left non-dominant side: Secondary | ICD-10-CM | POA: Diagnosis not present

## 2019-04-12 MED ORDER — ABOBOTULINUMTOXINA 500 UNITS IM SOLR
500.0000 [IU] | Freq: Once | INTRAMUSCULAR | Status: AC
  Administered 2019-04-12: 500 [IU] via INTRAMUSCULAR

## 2019-04-12 NOTE — Progress Notes (Signed)
[facility]  CC:  Spastic hemiplegia  ALMADELIA Newton is a 68  y.o. Right-handed Caucasian female, return for EMG guided Botox injection for her left spastic hemiparesis,  She suffered stroke in 1999, with residual left spastic hemiparesis, upper extremity more than leg, left visual field cut  She previously was enrolled in Botox research study for spastic upper extremity at [facility] by Dr. Marya Fossa, received EMG guided Botox injection in 2010 for about 2 years, with good benefit, but has difficulty with insurance  She began to receive Botox injection through our clinic by Dr. Hosie Poisson since May 2015,150 units total to left upper extremity, which has been very helpful  She is return for continued EMG guided Botox injection, at baseline, she can ambulate with a left AFO, but without assistance,  Profound left upper extremity spasticity, achiness, chronic pain, persistent left wrist flexion, finger flexion,  She also has a history of coronary artery disease in 2007, taking Plavix, and Aggrenox, exercise regularly, has a personal trainer  UPDATE March 2nd 2016: She received 300 units in Jun 13 2014, to left upper extremity, which has been very helpful, she did not notice significant side effect. The injection help her open her left hand better, no significant side effect noticed.  UPDATE June 8th 2016: She received 300 units of Botox A in March second 2016, responded very well, she was able to relax her left hand better, she ambulate without assistance with spastic left hemiparesis, complains of left facial, left foot pain, wants the injection to emphasize on her left finger flexion wrist flexion today.  UPDATE Sep 28th 2016: She responded to previous Botox injection very well in June, she wants to emphasize on her left upper extremity at this time, complains forceful finger contraction, flexion, elbow flexion. Only mild left shoulder pain with passive movement.   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 Patient is concerned about potential side effect of weakness.  Update May 21 2016: She responded very well to previous injection in July, only wants to receive injection for spastic left upper extremity, she had a pair of left wrist/finger splint,  Update September 09 2016: She is now using a left wrist splint, which has been helpful,  UPDATE Dec 10 2016: She responded well to previous injection no significant side effect noticed,   UPDATE Apr 14 2017: She wear left wrist pain, denies significant side effect, complains of left-sided neuropathic pain, despite Prozac 20 mg, Nucynta 50 mg, gabapentin 600 mg twice a day,  UPDATE Jul 14 2017: She responded very well to previous injection in September 2018,  UPDATE October 13 2017: She responded very well to previous Botox injection  UPDATE January 26 2018: She complains of left leg weakness was injection, wants to focus injection on left upper extremity,  UPDATE May 11 2018: She responded well to previous injection  Update August 11, 2018: She responded well to previous injection, no  significant side effect noted  UPDATE January 03 2019: She responded very well to previous injection  Update April 12, 2019: She responded well to previous injection Social History   Socioeconomic  History  . Marital status: Married    Spouse name: Not on file  . Number of children: Not on file  . Years of education: Not on file  . Highest education level: Not on file  Occupational History  . Occupation: disabled  Social Needs  . Financial resource strain: Not on file  . Food insecurity    Worry: Not on file    Inability: Not on file  . Transportation needs    Medical: Not on file    Non-medical: Not on file  Tobacco Use  . Smoking status: Former Games developermoker  . Smokeless tobacco: Never Used  . Tobacco comment: Quit in 2005  Substance and Sexual Activity  . Alcohol use: Yes    Comment: social  . Drug use: No  . Sexual activity: Not Currently  Lifestyle  . Physical activity    Days per week: Not on file    Minutes per session: Not on file  . Stress: Not on file  Relationships  . Social Musicianconnections    Talks on phone: Not on file    Gets together: Not on file    Attends religious service: Not on file    Active member of club or organization: Not on file    Attends meetings of clubs or organizations: Not on file    Relationship status: Not on file  . Intimate partner violence    Fear of current or ex partner: Not on file    Emotionally abused: Not on file    Physically abused: Not on file    Forced sexual activity: Not on file  Other Topics Concern  . Not on file  Social History Narrative   Married, no children   Right handed   12th grade   1 cup daily    Family History  Problem Relation Age of Onset  . Arrhythmia Father        PPM  . Colon cancer Neg Hx     Past Medical History:  Diagnosis Date  . AMI (acute myocardial infarction) (HCC) 2007   failed PCI to the distal LAD - managed medically  . Chronic pain   . Complication of anesthesia   . CVA (cerebral vascular accident) (HCC) 1999  . Degenerative disc disease, cervical   . Hypothyroidism   . PONV (postoperative nausea and vomiting)   . Unstable angina [facility](HCC)     Past Surgical History:   Procedure Laterality Date  . COLONOSCOPY WITH PROPOFOL N/A 05/24/2018   Procedure: COLONOSCOPY WITH PROPOFOL;  Surgeon: West BaliFields, Sandi L, MD;  Location: [facility];  Service: Endoscopy;  Laterality: N/A;  12:45pm  . ENDOMETRIAL ABLATION  3/06  . left ankle surgery      Current Outpatient Medications  Medication Sig Dispense Refill  . AbobotulinumtoxinA (DYSPORT) 500 units SOLR injection Inject 1,500 Units into the muscle every 3 (three) months.    . ALPRAZolam (XANAX) 0.5 MG tablet Take 0.5 mg by mouth at bedtime.     Marland Kitchen. aspirin 81 MG tablet Take 81 mg by mouth daily.    . Calcium Carbonate-Vit D-Min (CALCIUM 1200 PO) Take 1 tablet by mouth daily.     . cetirizine (ZYRTEC) 10 MG tablet Take 10 mg by mouth at bedtime.     . Cholecalciferol (VITAMIN D3) 1000  units CAPS Take 1,000 Units by mouth daily.    . clopidogrel (PLAVIX) 75 MG tablet Take 1 tablet (75 mg total) by mouth daily. 90 tablet 3  . DENTA 5000 PLUS 1.1 % CREA dental cream Place 1 application onto teeth at bedtime.   4  . diphenhydrAMINE (BENADRYL) 12.5 MG/5ML liquid Take 12.5 mg by mouth 4 (four) times daily as needed for allergies.    . DULoxetine (CYMBALTA) 60 MG capsule TAKE ONE CAPSULE BY MOUTH DAILY 90 capsule 3  . FLUoxetine (PROZAC) 20 MG capsule Take 20 mg by mouth daily.      Marland Kitchen gabapentin (NEURONTIN) 600 MG tablet Take 1,200 mg by mouth at bedtime.     Marland Kitchen guaiFENesin (MUCINEX) 600 MG 12 hr tablet Take 600 mg by mouth 2 (two) times daily as needed for cough or to loosen phlegm.    . ibandronate (BONIVA) 150 MG tablet Take 150 mg by mouth every 30 (thirty) days. Take in the morning with a full glass of water, on an empty stomach, and do not take anything else by mouth or lie down for the next 30 min.    . IncobotulinumtoxinA (XEOMIN IM) Inject 500 Units into the muscle every 3 (three) months.    . levothyroxine (SYNTHROID, LEVOTHROID) 75 MCG tablet Take 75 mcg by mouth daily before breakfast.    . linaclotide (LINZESS)  290 MCG CAPS capsule Take 290 mcg by mouth daily before breakfast.     . Menthol (ICY HOT) 5 % PTCH Apply 1 patch topically at bedtime.    . nitroGLYCERIN (NITROSTAT) 0.4 MG SL tablet Place 0.4 mg under the tongue every 5 (five) minutes as needed for chest pain.    . NUCYNTA 50 MG tablet Take 50 mg by mouth 4 (four) times daily as needed for severe pain.  0  . NUCYNTA ER 50 MG 12 hr tablet Take 50 mg by mouth every 12 (twelve) hours.   0  . pantoprazole (PROTONIX) 40 MG tablet Take 40 mg by mouth daily as needed (for acid reflux).     . ranolazine (RANEXA) 500 MG 12 hr tablet Take 1 tablet (500 mg total) by mouth daily. 90 tablet 3  . rOPINIRole (REQUIP) 2 MG tablet Take 2 mg by mouth at bedtime.     . rosuvastatin (CRESTOR) 5 MG tablet Take 1 tablet (5 mg total) by mouth daily. 90 tablet 3  . fluticasone (FLONASE) 50 MCG/ACT nasal spray Place 2 sprays into both nostrils daily as needed for allergies.      No current facility-administered medications for this visit.     Allergies as of 04/11/2019 - Review Complete 04/11/2019  Allergen Reaction Noted  . Morphine Other (See Comments) 01/01/2009  . Zolpidem tartrate Other (See Comments) 01/01/2009  . Penicillins Rash and Other (See Comments) 01/01/2009    Vitals: BP 118/77   Pulse 79   Temp (!) 97.5 F (36.4 C)   Ht 5\' 2"  (1.575 m)   Wt 149 lb 12 oz (67.9 kg)   BMI 27.39 kg/m  Last Weight:  Wt Readings from Last 1 Encounters:  04/11/19 149 lb 12 oz (67.9 kg)   Last Height:   Ht Readings from Last 1 Encounters:  04/11/19 5\' 2"  (1.575 m)   PHYSICAL EXAMINATOINS:     Mentation: Alert oriented to time, place, history taking, and causual conversation, left lower face weakness  Cranial nerve II-XII: Pupils were equal round reactive to light extraocular movements were full, left visual  field cut.   Left lower face weakness. hearing was intact to finger rubbing bilaterally. Uvula tongue midline.  head turning and shoulder shrug and  were normal and symmetric.Tongue protrusion into cheek strength was normal.  Motor: spastic left hemiparesis,  Wearing left AFO, left lower extremity moderate spasticity, left hip flexion 4, knee flexion 4, extension 5, Profound left upper extremity spasticity, left shoulder adduction, internal rotation.left elbow flexion, pronation, with passive movement, mild fixed contraction of left elbow, maximum 175, left wrist forceful flexion, finger flexion,thumb in position, even with forceful movement, she could not achieve full extension of her left finger  Gait: Rising up from seated position by pushing on chair arm,spastic left hemi-circumferential gait.  Deep tendon reflexes: hyperreflexia of left side   Assessment and plan:   68 years old right-handed Caucasian female, status post stroke, with spastic left hemiparesis since 1999, responded very well to previous EMG guided Botox injection, return to clinic for repeat injection  Under electric stimulation, 500 units of Dysport A x 3  was injected into left upper extremity muscles (500 units dysport/2.5cc of NS)  Left pronator teres 0.5cc x3=1.5 cc Left flexor digitorum profundus  0.5ccx2=1.0 cc Left opponents 0.5 cc Left brachialis 0.5cc x 2=.1.0 cc  Left palmaris longus 0.5cc Left pectoralis major 0.5ccx4=2.0 cc Left latissimus dorsi 0.5 cc x 2= 1.0 cc   Marcial Pacas, M.D. Ph.D.  [facility] [place], [place] [address] Phone: [phone] Fax:      [phone]

## 2019-04-14 DIAGNOSIS — L89892 Pressure ulcer of other site, stage 2: Secondary | ICD-10-CM | POA: Diagnosis not present

## 2019-04-14 DIAGNOSIS — M79672 Pain in left foot: Secondary | ICD-10-CM | POA: Diagnosis not present

## 2019-04-20 NOTE — Progress Notes (Signed)
CARDIOLOGY OFFICE NOTE  Date:  04/24/2019    Horald PollenJane G Bumpus Date of Birth: 02/06/1951 Medical Record #[id number]  PCP:  Selinda FlavinHoward, Kevin, MD  Cardiologist:  Tyrone SageGerhardt   Chief Complaint  Patient presents with   Follow-up    History of Present Illness: Horald PollenJane G Goheen is a 68 y.o. female who presents today for a follow up visit. She is a former patient of Dr. Vern ClaudeWall's - now followed by me.   She hasa history ofCAD with past MI in 2007 and had failed PCI to the distal LAD - managed medically since -was using"PRN" Ranexa. On chronic DAPT with aspirin/Plavix.Other issues include prior CVA from 1999(on aspirin/Plavix) with considerable residual nerve pain, hypothyroidism, HLD and GERD.  When seen in January of 2017 washaving some chest pain. Her exercise tolerance was stable. We did get her Myoview updated and this looked ok.Sheremains mostly limited bysignificant nerve pain fromherremote stroke but she continues to "press on"in quite an amazing fashion.  [place]having lots of symptoms . Husband noted a definite change with more fatigue and more complaints of chest pain. 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 Elected to start daily Ranexa versus proceeding with cardiac cath - she has not been able to tolerate BID dosing per her report due to "terrific headache".On return she was much improved and we agreed to continue with medical therapy. We did have to cut her Zocor back and she postponed her colonoscopy until symptoms resolved.Has tended to remain primarily limited by her nerve pain. Last visit with me in the office was in December and she was happy with how she was doing. She was going into a research study for her lipids. We did a telehealth visit back in June - she was doing well but much more limited in ability to be active and do her rehab due to the pandemic and the gyms  being closed.   The patient does not have symptoms concerning for COVID-19 infection (fever, chills, cough, or new shortness of breath).   Comes in today. Here with her husband. She seems to be doing ok. She has her trainer coming to her house. She is using her recumbent bike - riding about a mile a day. No chest pain. No jaw or arm pain. No NTG use. Rare indigestion. She is on low dose Crestor - no problems noted - LDL not at goal - she thinks she could try to increase. BP is doing well. She is asking about returning to the research study. She and her husband feel like she is doing.   Past Medical History:  Diagnosis Date   AMI (acute myocardial infarction) (HCC) 2007   failed PCI to the distal LAD - managed medically   Chronic pain    Complication of anesthesia    CVA (cerebral vascular accident) (HCC) 1999   Degenerative disc disease, cervical    Hypothyroidism    PONV (postoperative nausea and vomiting)    Unstable angina (HCC)     Past Surgical History:  Procedure Laterality Date   COLONOSCOPY WITH PROPOFOL N/A 05/24/2018   Procedure: COLONOSCOPY WITH PROPOFOL;  Surgeon: West BaliFields, Sandi L, MD;  Location: [facility];  Service: Endoscopy;  Laterality: N/A;  12:45pm   ENDOMETRIAL ABLATION  3/06   left ankle surgery       Medications: Current Meds  Medication Sig   AbobotulinumtoxinA (DYSPORT) 500 units  SOLR injection Inject 1,500 Units into the muscle every 3 (three) months.   ALPRAZolam (XANAX) 0.5 MG tablet Take 0.5 mg by mouth at bedtime.    aspirin 81 MG tablet Take 81 mg by mouth daily.   Calcium Carbonate-Vit D-Min (CALCIUM 1200 PO) Take 1 tablet by mouth daily.    cetirizine (ZYRTEC) 10 MG tablet Take 10 mg by mouth at bedtime.    Cholecalciferol (VITAMIN D3) 1000 units CAPS Take 1,000 Units by mouth daily.   clopidogrel (PLAVIX) 75 MG tablet Take 1 tablet (75 mg total) by mouth daily.   DENTA 5000 PLUS 1.1 % CREA dental cream Place 1 application  onto teeth at bedtime.    diphenhydrAMINE (BENADRYL) 12.5 MG/5ML liquid Take 12.5 mg by mouth 4 (four) times daily as needed for allergies.   DULoxetine (CYMBALTA) 60 MG capsule TAKE ONE CAPSULE BY MOUTH DAILY   FLUoxetine (PROZAC) 20 MG capsule Take 20 mg by mouth daily.     fluticasone (FLONASE) 50 MCG/ACT nasal spray Place 2 sprays into both nostrils daily as needed for allergies.    gabapentin (NEURONTIN) 600 MG tablet Take 1,200 mg by mouth at bedtime.    guaiFENesin (MUCINEX) 600 MG 12 hr tablet Take 600 mg by mouth 2 (two) times daily as needed for cough or to loosen phlegm.   ibandronate (BONIVA) 150 MG tablet Take 150 mg by mouth every 30 (thirty) days. Take in the morning with a full glass of water, on an empty stomach, and do not take anything else by mouth or lie down for the next 30 min.   IncobotulinumtoxinA (XEOMIN IM) Inject 500 Units into the muscle every 3 (three) months.   levothyroxine (SYNTHROID, LEVOTHROID) 75 MCG tablet Take 75 mcg by mouth daily before breakfast.   Menthol (ICY HOT) 5 % PTCH Apply 1 patch topically at bedtime.   nitroGLYCERIN (NITROSTAT) 0.4 MG SL tablet Place 0.4 mg under the tongue every 5 (five) minutes as needed for chest pain.   NUCYNTA 50 MG tablet Take 50 mg by mouth 4 (four) times daily as needed for severe pain.   NUCYNTA ER 50 MG 12 hr tablet Take 50 mg by mouth every 12 (twelve) hours.    pantoprazole (PROTONIX) 40 MG tablet Take 40 mg by mouth daily as needed (for acid reflux).    ranolazine (RANEXA) 500 MG 12 hr tablet Take 1 tablet (500 mg total) by mouth daily.   rOPINIRole (REQUIP) 2 MG tablet Take 2 mg by mouth at bedtime.    [DISCONTINUED] rosuvastatin (CRESTOR) 5 MG tablet Take 1 tablet (5 mg total) by mouth daily.     Allergies: Allergies  Allergen Reactions   Morphine Other (See Comments)    Sick on stomach   Zolpidem Tartrate Other (See Comments)    Sleep walk   Penicillins Rash and Other (See Comments)     Has patient had a PCN reaction causing immediate rash, facial/tongue/throat swelling, SOB or lightheadedness with hypotension: Unknown Has patient had a PCN reaction causing severe rash involving mucus membranes or skin necrosis: No Has patient had a PCN reaction that required hospitalization: No Has patient had a PCN reaction occurring within the last 10 years: No If all of the above answers are "NO", then may proceed with Cephalosporin use.     Social History: The patient  reports that she has quit smoking. She has never used smokeless tobacco. She reports current alcohol use. She reports that she does not use drugs.  Family History: The patient's family history includes Arrhythmia in her father.   Review of Systems: Please see the history of present illness.   All other systems are reviewed and negative.   Physical Exam: VS:  Pulse 87    Ht 5\' 2"  (1.575 m)    Wt 140 lb 1.9 oz (63.6 kg)    BMI 25.63 kg/m  .  BMI Body mass index is 25.63 kg/m.  Wt Readings from Last 3 Encounters:  04/24/19 140 lb 1.9 oz (63.6 kg)  04/11/19 149 lb 12 oz (67.9 kg)  01/10/19 140 lb (63.5 kg)   BP is 120/60  General: Pleasant. Well developed, well nourished and in no acute distress.  Weight is up 4 pounds since December.  HEENT: Normal.  Neck: Supple, no JVD, carotid bruits, or masses noted.  Cardiac: Regular rate and rhythm. No murmurs, rubs, or gallops. No edema.  Respiratory:  Lungs are clear to auscultation bilaterally with normal work of breathing.  GI: Soft and nontender.  MS: No deformity or atrophy. Gait and ROM intact.  Skin: Warm and dry. Color is normal.  Neuro:  Strength and sensation are intact and no gross focal deficits noted.  Psych: Alert, appropriate and with normal affect.   LABORATORY DATA:  EKG:  EKG is ordered today. This demonstrates NSR with HR of 87 - low voltage. Unchanged. No QRS widening noted.   Lab Results  Component Value Date   WBC 7.7 11/17/2017   HGB  13.9 11/17/2017   HCT 41.0 11/17/2017   PLT 307 11/17/2017   GLUCOSE 97 04/19/2018   CHOL 171 04/19/2018   TRIG 78 04/19/2018   HDL 59 04/19/2018   LDLCALC 96 04/19/2018   ALT 11 04/19/2018   AST 17 04/19/2018   NA 138 04/19/2018   K 4.5 04/19/2018   CL 99 04/19/2018   CREATININE 0.83 04/19/2018   BUN 12 04/19/2018   CO2 26 04/19/2018   TSH 0.940 11/17/2017       BNP (last 3 results) No results for input(s): BNP in the last 8760 hours.  ProBNP (last 3 results) No results for input(s): PROBNP in the last 8760 hours.   Other Studies Reviewed Today:  MyoviewStudy Result1/2017   No diagnostic ST segment changes to indicate ischemia.  Small, mild intensity, lateral apical region of ischemia in the setting of artifact from extracardiac radiotracer uptake and also arms down imaging. No large ischemic territories.  This is a low risk study.  Nuclear stress EF: 74%.     Echo Study Conclusions from 2011 Left ventricle: The cavity size was normal. Systolic function was normal. The estimated ejection fraction was in the range of 55% to 60%. Wall motion was normal; there were no regional wall motion abnormalities.    CARDIAC CATHETERIZATION 2007  HEMODYNAMICS:  1. Left main coronary artery: The left main coronary artery is free of  significant disease.  2. Left anterior descending artery: The left anterior descending artery  gave rise to two diagonal branches and a septal perforator. The LAD was  quite tortuous in its mid portion and then had a 99% occlusion in its  distal portion with TIMI-I flow.  3. Circumflex artery: The circumflex artery gave rise to an atrial branch,  marginal branch, and two posterolateral branches. These vessels were  free of significant disease.  4. Right coronary artery: The right coronary artery is a moderate size  vessel which gave rise to a clonus branch, right ventricular branch,  posterior  descending branch,  and a posterolateral branch. This vessel  was free of significant disease.  LEFT VENTRICULOGRAM: The left ventriculogram was obtained in the RAO  projection showed akinesis of the tip of the apex in the inferoapical  segment. The overall wall motion was good with an estimated ejection  fraction of 60%.  Following attempt at PCA there was persistent 99% stenosis in the distal LAD  with TIMI-I flow.  CONCLUSION:  1. Coronary artery disease, status post recent non-ST-elevation myocardial  infarction with 99% stenosis in the distal left anterior descending with  TIMI-I flow, no significant obstruction in the circumflex and right  coronary arteries, and inferoapical wall akinesis.  2. Unsuccessful percutaneous coronary intervention of the lesion in the  distal left anterior descending due to inability to cross with the wire.  DISPOSITION: The patient returned to the postanesthesia care unit for  further observation. I think her outlook should be good on medical therapy.  She has no significant residual coronary disease and overall LV function is  good.  ______________________________  Eustace Quail, M.D. [facility]  BB/MEDQ D: 08/25/2005 T: 08/25/2005 Job: [id number]     ASSESSMENT & PLAN:    1. CAD - remote NSTEMI - with unsuccessful PCI to the distal LADback in 2007and no significant residual CADat that time-she has been managed medically since that time.She remains on chronic DAPT. She is now on daily dosing of her Ranexa (used to take prn per Dr. Verl Blalock). She has opted for medical therapy - has not wished to proceed with cath due to risk of stroke and she already has considerable disability from a prior stroke - she continues to do well. No active symptoms. She is doing quite well.   2. HLD -She was on less Zocor due to drug interaction with the Ranexa and was also having significant myalgias -now on low dose Crestor - she was also in a research study - we will see if  we can get her back there. She is agreeable to trying to increase the Crestor to get her LDL closer to goal.   3. Prior stroke -previously on coumadin and has been on Aggrenox - nowon chronic Plavixand aspirin due to her CAD.She remains  limited by residual nerve pain.She has chronic left sided weakness.She is back exercising in a different fashion - has had a callus on her foot so no more outdoor walking for now.   4. Chronic nerve pain - on Botox and narcotic therapy.She continues to do amazingly wellin my opinion.She looks good today.   5. COVID-19 Education: The signs and symptoms of COVID-19 were discussed with the patient and how to seek care for testing (follow up with PCP or arrange E-visit).  The importance of social distancing, staying at home, hand hygiene and wearing a mask when out in public were discussed today.  Current medicines are reviewed with the patient today.  The patient does not have concerns regarding medicines other than what has been noted above.  The following changes have been made:  See above.  Labs/ tests ordered today include:    Orders Placed This Encounter  Procedures   Hepatic function panel   Lipid panel   EKG 12-Lead     Disposition:   FU with me in 6 months.   Patient is agreeable to this plan and will call if any problems develop in the interim.   SignedTruitt Merle, NP  04/24/2019 10:01 AM  [facility]  [address] [place], [place]  [address] Phone: [phone] Fax: [phone]

## 2019-04-24 ENCOUNTER — Ambulatory Visit: Payer: Medicare HMO | Admitting: Nurse Practitioner

## 2019-04-24 ENCOUNTER — Other Ambulatory Visit: Payer: Self-pay

## 2019-04-24 ENCOUNTER — Encounter: Payer: Self-pay | Admitting: Nurse Practitioner

## 2019-04-24 VITALS — HR 87 | Ht 62.0 in | Wt 140.1 lb

## 2019-04-24 DIAGNOSIS — I259 Chronic ischemic heart disease, unspecified: Secondary | ICD-10-CM

## 2019-04-24 DIAGNOSIS — E7800 Pure hypercholesterolemia, unspecified: Secondary | ICD-10-CM

## 2019-04-24 DIAGNOSIS — Z7189 Other specified counseling: Secondary | ICD-10-CM | POA: Diagnosis not present

## 2019-04-24 DIAGNOSIS — Z006 Encounter for examination for normal comparison and control in clinical research program: Secondary | ICD-10-CM | POA: Diagnosis not present

## 2019-04-24 DIAGNOSIS — Z79899 Other long term (current) drug therapy: Secondary | ICD-10-CM

## 2019-04-24 MED ORDER — ROSUVASTATIN CALCIUM 10 MG PO TABS: 10.0000 mg | ORAL_TABLET | Freq: Every day | ORAL | 3 refills | Status: DC

## 2019-04-24 NOTE — Patient Instructions (Addendum)
After Visit Summary:  We will be checking the following labs today - NONE  Lipids and LFTs in 3 months   Medication Instructions:    Continue with your current medicines. BUT  Let's try to increase the Crestor 10 mg a day - I sent this to [facility]   If you need a refill on your cardiac medications before your next appointment, please call your pharmacy.     Testing/Procedures To Be Arranged:  N/A  Follow-Up:   See me in 6 months  I will send a message to Jake Bathe and see if they can see you in the research study    At [facility], you and your health needs are our priority.  As part of our continuing mission to provide you with exceptional heart care, we have created designated Provider Care Teams.  These Care Teams include your primary Cardiologist (physician) and Advanced Practice Providers (APPs -  Physician Assistants and Nurse Practitioners) who all work together to provide you with the care you need, when you need it.  Special Instructions:  . Stay safe, stay home, wash your hands for at least 20 seconds and wear a mask when out in public.  . It was good to talk with you today.    Call the [place] office at [phone] if you have any questions, problems or concerns.

## 2019-05-02 ENCOUNTER — Telehealth: Payer: Self-pay

## 2019-05-02 NOTE — Telephone Encounter (Signed)
Patient called and requested to know why she was receiving such a high bill for her Dysport injection. After speaking with several people through her insurance, they informed there was an error with the code placed on the auth. I had them correct this information, made our billing department aware, and made the patient aware. DW

## 2019-05-03 ENCOUNTER — Encounter: Payer: Medicare HMO | Admitting: *Deleted

## 2019-05-03 ENCOUNTER — Other Ambulatory Visit: Payer: Self-pay

## 2019-05-03 DIAGNOSIS — Z006 Encounter for examination for normal comparison and control in clinical research program: Secondary | ICD-10-CM

## 2019-05-03 NOTE — Research (Signed)
Subject to research clinic for visit 3 in the [place] research study. No cos, aes or saes to report.  Injection given, patient tolerated well and next appointment scheduled.

## 2019-05-15 DIAGNOSIS — Z79891 Long term (current) use of opiate analgesic: Secondary | ICD-10-CM | POA: Diagnosis not present

## 2019-05-15 DIAGNOSIS — M542 Cervicalgia: Secondary | ICD-10-CM | POA: Diagnosis not present

## 2019-05-15 DIAGNOSIS — G894 Chronic pain syndrome: Secondary | ICD-10-CM | POA: Diagnosis not present

## 2019-05-15 DIAGNOSIS — G890 Central pain syndrome: Secondary | ICD-10-CM | POA: Diagnosis not present

## 2019-05-15 DIAGNOSIS — Z79899 Other long term (current) drug therapy: Secondary | ICD-10-CM | POA: Diagnosis not present

## 2019-05-24 ENCOUNTER — Ambulatory Visit: Payer: Medicare HMO | Admitting: Gastroenterology

## 2019-05-27 ENCOUNTER — Encounter: Payer: Self-pay | Admitting: Gastroenterology

## 2019-05-27 NOTE — Progress Notes (Signed)
Referring Provider: Selinda FlavinHoward, Kevin, MD Primary Care Physician:  Selinda FlavinHoward, Kevin, MD Primary GI Physician: Dr. Darrick PennaFields  Chief Complaint  Patient presents with  . Constipation    BM's are doing fine    HPI:   [name] is a 68 y.o. female presenting today with a GI history of constipation.  Last colonoscopy on 05/24/2018 with normal TI, significant looping of the colon, and external hemorrhoids.  Recommendations for no additional colonoscopies for screening purposes.  Patient was last seen via virtual visit on 11/22/2018 for colonoscopy follow-up and constipation.  Patient had failed Movantik and Linzess 145 mcg.  She was on Linzess 290 mcg and reported BM every 3-4 days at times.  Taking additional Senokot as needed.  She reported her pain clinic doctor provided Amitiza prescription but she had not started them as she wanted to complete her current Linzess prescription.  Advise she try taking Linzess with a small snack to increase efficacy.  Follow-up in 6 months.  Today she states she is taking Amitiza 24 mcg once a day. She has also changed her calcium. BMs every 2-3 days. This is improved. Prior to Amitiza, reports BMs every 4-5 days. Occasional abdominal cramping which is then followed by a BM. Cramping resolves after a BM. This is mild. Was having a small amount of brbpr on toilet tissue before starting Amitiza when she would have large and hard BMs. Felt like she was having a baby. She has not had this since starting Amitiza. No brbpr since starting Amitiza. States she has hemorrhoids and has to use suppositories as needed, but not since starting Amitiza. No significant gas production. Stools are soft and formed. [place] 4. Minimal straining to get going. No black stools.   No nausea or vomiting. Rare reflux if she drinks coffee on an empty stomach. Takes Protonix as needed. About once a week or less. Will also use it if she eats spicy foods. No dysphagia. No unintentional weight loss.   Past Medical History:  Diagnosis Date  . AMI (acute myocardial infarction) (HCC) 2007   failed PCI to the distal LAD - managed medically  . Chronic pain   . Complication of anesthesia   . CVA (cerebral vascular accident) (HCC) 1999  . Degenerative disc disease, cervical   . Hypothyroidism   . PONV (postoperative nausea and vomiting)   . Unstable angina [facility](HCC)     Past Surgical History:  Procedure Laterality Date  . COLONOSCOPY WITH PROPOFOL N/A 05/24/2018   PROPOFOL;  Surgeon: West BaliFields, Sandi L, MD; normal TI, significant looping of the colon, external hemorrhoids.  . ENDOMETRIAL ABLATION  3/06  . left ankle surgery      Current Outpatient Medications  Medication Sig Dispense Refill  . AbobotulinumtoxinA (DYSPORT) 500 units SOLR injection Inject 1,500 Units into the muscle every 3 (three) months.    . ALPRAZolam (XANAX) 0.5 MG tablet Take 0.5 mg by mouth at bedtime.     . AMITIZA 24 MCG capsule Take 1 capsule (24 mcg total) by mouth 2 (two) times daily with a meal. 180 capsule 3  . aspirin 81 MG tablet Take 81 mg by mouth daily.    Marland Kitchen. CALCIUM PO Take 2 tablets by mouth 2 (two) times daily.    . cetirizine (ZYRTEC) 10 MG tablet Take 10 mg by mouth at bedtime.     . Cholecalciferol (VITAMIN D3) 1000 units CAPS Take 1,000 Units by mouth daily.    . clopidogrel (PLAVIX) 75 MG tablet  Take 1 tablet (75 mg total) by mouth daily. 90 tablet 3  . DENTA 5000 PLUS 1.1 % CREA dental cream Place 1 application onto teeth at bedtime.   4  . diphenhydrAMINE (BENADRYL) 12.5 MG/5ML liquid Take 12.5 mg by mouth 4 (four) times daily as needed for allergies.    . DULoxetine (CYMBALTA) 60 MG capsule TAKE ONE CAPSULE BY MOUTH DAILY 90 capsule 3  . FLUoxetine (PROZAC) 20 MG capsule Take 20 mg by mouth daily.      . fluticasone (FLONASE) 50 MCG/ACT nasal spray Place 2 sprays into both nostrils daily as needed for allergies.     Marland Kitchen gabapentin (NEURONTIN) 600 MG tablet Take 1,200 mg by mouth at bedtime. Can take  an extra 600mg  if needed    . guaiFENesin (MUCINEX) 600 MG 12 hr tablet Take 600 mg by mouth 2 (two) times daily as needed for cough or to loosen phlegm.    . ibandronate (BONIVA) 150 MG tablet Take 150 mg by mouth every 30 (thirty) days. Take in the morning with a full glass of water, on an empty stomach, and do not take anything else by mouth or lie down for the next 30 min.    . IncobotulinumtoxinA (XEOMIN IM) Inject 500 Units into the muscle every 3 (three) months.    . levothyroxine (SYNTHROID, LEVOTHROID) 75 MCG tablet Take 75 mcg by mouth daily before breakfast.    . Menthol (ICY HOT) 5 % PTCH Apply 1 patch topically at bedtime.    . nitroGLYCERIN (NITROSTAT) 0.4 MG SL tablet Place 0.4 mg under the tongue every 5 (five) minutes as needed for chest pain.    . NUCYNTA 50 MG tablet Take 50 mg by mouth 4 (four) times daily as needed for severe pain.  0  . NUCYNTA ER 50 MG 12 hr tablet Take 50 mg by mouth every 12 (twelve) hours.   0  . pantoprazole (PROTONIX) 40 MG tablet Take 40 mg by mouth daily as needed (for acid reflux).     . ranolazine (RANEXA) 500 MG 12 hr tablet Take 1 tablet (500 mg total) by mouth daily. 90 tablet 3  . rOPINIRole (REQUIP) 2 MG tablet Take 2 mg by mouth at bedtime.     . rosuvastatin (CRESTOR) 10 MG tablet Take 1 tablet (10 mg total) by mouth daily. 90 tablet 3   No current facility-administered medications for this visit.     Allergies as of 05/29/2019 - Review Complete 05/29/2019  Allergen Reaction Noted  . Morphine Other (See Comments) 01/01/2009  . Zolpidem tartrate Other (See Comments) 01/01/2009  . Penicillins Rash and Other (See Comments) 01/01/2009    Family History  Problem Relation Age of Onset  . Arrhythmia Father        PPM  . Colon cancer Neg Hx     Social History   Socioeconomic History  . Marital status: Married    Spouse name: Not on file  . Number of children: Not on file  . Years of education: Not on file  . Highest education  level: Not on file  Occupational History  . Occupation: disabled  Social Needs  . Financial resource strain: Not on file  . Food insecurity    Worry: Not on file    Inability: Not on file  . Transportation needs    Medical: Not on file    Non-medical: Not on file  Tobacco Use  . Smoking status: Former 03/03/2009  . Smokeless tobacco: Never  Used  . Tobacco comment: Quit in 2005  Substance and Sexual Activity  . Alcohol use: Yes    Comment: social  . Drug use: No  . Sexual activity: Not Currently  Lifestyle  . Physical activity    Days per week: Not on file    Minutes per session: Not on file  . Stress: Not on file  Relationships  . Social Herbalist on phone: Not on file    Gets together: Not on file    Attends religious service: Not on file    Active member of club or organization: Not on file    Attends meetings of clubs or organizations: Not on file    Relationship status: Not on file  Other Topics Concern  . Not on file  Social History Narrative   Married, no children   Right handed   12th grade   1 cup daily    Review of Systems: Gen: Denies fever, chills.  CV: Denies chest pain, palpitations Resp: Denies dyspnea at rest or with exertion or cough.  GI: See HPI Derm: Denies rash Psych: Denies depression, anxiety Heme: See HPI  Physical Exam: BP 128/88   Pulse 90   Temp 98.6 F (37 C) (Oral)   Ht 5\' 2"  (1.575 m)   Wt 145 lb 6.4 oz (66 kg)   BMI 26.59 kg/m  General:   Alert and oriented. No distress noted. Pleasant and cooperative.  Head:  Normocephalic and atraumatic. Eyes:  Conjuctiva clear without scleral icterus. Heart:  S1, S2 present without murmurs appreciated. Lungs:  Clear to auscultation bilaterally. No wheezes, rales, or rhonchi. No distress.  Abdomen:  +BS, soft, non-tender and non-distended. No rebound or guarding. No HSM or masses noted. Msk:  Symmetrical without gross deformities. Normal posture. Extremities:  Without edema.  Neurologic:  Alert and  oriented x4 Psych:  Normal mood and affect.

## 2019-05-29 ENCOUNTER — Other Ambulatory Visit: Payer: Self-pay

## 2019-05-29 ENCOUNTER — Ambulatory Visit: Payer: Medicare HMO | Admitting: Gastroenterology

## 2019-05-29 ENCOUNTER — Encounter: Payer: Self-pay | Admitting: Gastroenterology

## 2019-05-29 DIAGNOSIS — T402X5D Adverse effect of other opioids, subsequent encounter: Secondary | ICD-10-CM | POA: Diagnosis not present

## 2019-05-29 DIAGNOSIS — K5903 Drug induced constipation: Secondary | ICD-10-CM | POA: Diagnosis not present

## 2019-05-29 DIAGNOSIS — R69 Illness, unspecified: Secondary | ICD-10-CM | POA: Diagnosis not present

## 2019-05-29 DIAGNOSIS — K5909 Other constipation: Secondary | ICD-10-CM | POA: Insufficient documentation

## 2019-05-29 DIAGNOSIS — T402X5A Adverse effect of other opioids, initial encounter: Secondary | ICD-10-CM

## 2019-05-29 MED ORDER — AMITIZA 24 MCG PO CAPS: 24.0000 ug | ORAL_CAPSULE | Freq: Two times a day (BID) | ORAL | 3 refills | Status: DC

## 2019-05-29 NOTE — Patient Instructions (Signed)
Please start taking Amitiza 24 mcg twice a day with breakfast and dinner. If you develop diarrhea, you can skip the second dose if you need to. I have sent in a 90 day prescription to [facility] for you.   We will plan to see you back in 6 months. Call if questions or concerns prior.   Aliene Altes, PA-C [facility]

## 2019-06-02 NOTE — Assessment & Plan Note (Signed)
She has failed Movantik, Linzess 145 mcg and Linzess to 90 mcg in the past.  Currently taking Amitiza 24 mcg once a day.  BMs [place] 4 every 2-3 days which patient reports is improved.  Occasional mild abdominal cramping prior to a bowel movement that resolves after a BM.  Reported having BRBPR on toilet tissue when having a very large/hard BM prior to starting Amitiza.  No bright red blood per rectum since.  Suspect this was likely related to hemorrhoids or possible anorectal tear with reported bowel movements "felt like she was having a baby."  Last colonoscopy in October 2019 with normal TI, significant looping of the colon, and external hemorrhoids.  Advised she increase Amitiza 24 mcg to twice daily with meals.  If this causes diarrhea, she can decrease back to once daily. Follow-up in 6 months.

## 2019-06-05 DIAGNOSIS — S52609A Unspecified fracture of lower end of unspecified ulna, initial encounter for closed fracture: Secondary | ICD-10-CM | POA: Diagnosis not present

## 2019-06-05 DIAGNOSIS — S52509A Unspecified fracture of the lower end of unspecified radius, initial encounter for closed fracture: Secondary | ICD-10-CM | POA: Diagnosis not present

## 2019-06-05 DIAGNOSIS — G8194 Hemiplegia, unspecified affecting left nondominant side: Secondary | ICD-10-CM | POA: Diagnosis not present

## 2019-06-07 DIAGNOSIS — S52602A Unspecified fracture of lower end of left ulna, initial encounter for closed fracture: Secondary | ICD-10-CM | POA: Diagnosis not present

## 2019-06-07 DIAGNOSIS — Z8673 Personal history of transient ischemic attack (TIA), and cerebral infarction without residual deficits: Secondary | ICD-10-CM | POA: Diagnosis not present

## 2019-06-07 DIAGNOSIS — S52502A Unspecified fracture of the lower end of left radius, initial encounter for closed fracture: Secondary | ICD-10-CM | POA: Diagnosis not present

## 2019-06-08 DIAGNOSIS — I252 Old myocardial infarction: Secondary | ICD-10-CM | POA: Diagnosis not present

## 2019-06-08 DIAGNOSIS — E039 Hypothyroidism, unspecified: Secondary | ICD-10-CM | POA: Diagnosis not present

## 2019-06-08 DIAGNOSIS — S52252A Displaced comminuted fracture of shaft of ulna, left arm, initial encounter for closed fracture: Secondary | ICD-10-CM | POA: Diagnosis not present

## 2019-06-08 DIAGNOSIS — Z7902 Long term (current) use of antithrombotics/antiplatelets: Secondary | ICD-10-CM | POA: Diagnosis not present

## 2019-06-08 DIAGNOSIS — S52502D Unspecified fracture of the lower end of left radius, subsequent encounter for closed fracture with routine healing: Secondary | ICD-10-CM | POA: Diagnosis not present

## 2019-06-08 DIAGNOSIS — W19XXXA Unspecified fall, initial encounter: Secondary | ICD-10-CM | POA: Diagnosis not present

## 2019-06-08 DIAGNOSIS — G2581 Restless legs syndrome: Secondary | ICD-10-CM | POA: Diagnosis not present

## 2019-06-08 DIAGNOSIS — S52602A Unspecified fracture of lower end of left ulna, initial encounter for closed fracture: Secondary | ICD-10-CM | POA: Diagnosis not present

## 2019-06-08 DIAGNOSIS — Z7982 Long term (current) use of aspirin: Secondary | ICD-10-CM | POA: Diagnosis not present

## 2019-06-08 DIAGNOSIS — S52502A Unspecified fracture of the lower end of left radius, initial encounter for closed fracture: Secondary | ICD-10-CM | POA: Diagnosis not present

## 2019-06-08 DIAGNOSIS — R69 Illness, unspecified: Secondary | ICD-10-CM | POA: Diagnosis not present

## 2019-06-08 DIAGNOSIS — I69354 Hemiplegia and hemiparesis following cerebral infarction affecting left non-dominant side: Secondary | ICD-10-CM | POA: Diagnosis not present

## 2019-06-08 DIAGNOSIS — I639 Cerebral infarction, unspecified: Secondary | ICD-10-CM | POA: Diagnosis not present

## 2019-06-08 DIAGNOSIS — G8918 Other acute postprocedural pain: Secondary | ICD-10-CM | POA: Diagnosis not present

## 2019-06-08 DIAGNOSIS — K219 Gastro-esophageal reflux disease without esophagitis: Secondary | ICD-10-CM | POA: Diagnosis not present

## 2019-06-12 DIAGNOSIS — G890 Central pain syndrome: Secondary | ICD-10-CM | POA: Diagnosis not present

## 2019-06-12 DIAGNOSIS — G894 Chronic pain syndrome: Secondary | ICD-10-CM | POA: Diagnosis not present

## 2019-06-12 DIAGNOSIS — M542 Cervicalgia: Secondary | ICD-10-CM | POA: Diagnosis not present

## 2019-06-12 DIAGNOSIS — R519 Headache, unspecified: Secondary | ICD-10-CM | POA: Diagnosis not present

## 2019-06-19 DIAGNOSIS — S52509A Unspecified fracture of the lower end of unspecified radius, initial encounter for closed fracture: Secondary | ICD-10-CM | POA: Diagnosis not present

## 2019-06-19 DIAGNOSIS — S52609A Unspecified fracture of lower end of unspecified ulna, initial encounter for closed fracture: Secondary | ICD-10-CM | POA: Diagnosis not present

## 2019-06-19 DIAGNOSIS — S52602D Unspecified fracture of lower end of left ulna, subsequent encounter for closed fracture with routine healing: Secondary | ICD-10-CM | POA: Diagnosis not present

## 2019-06-19 DIAGNOSIS — S52502D Unspecified fracture of the lower end of left radius, subsequent encounter for closed fracture with routine healing: Secondary | ICD-10-CM | POA: Diagnosis not present

## 2019-06-20 ENCOUNTER — Telehealth: Payer: Self-pay | Admitting: Neurology

## 2019-06-20 NOTE — Telephone Encounter (Signed)
Patient fell and broke wrist was advised not to get botox, will call back to rs once cleared by ortho Dr. Olen Pel

## 2019-06-20 NOTE — Telephone Encounter (Signed)
Pt called wanting to discuss her BOTOX appt. Please advise.

## 2019-06-22 ENCOUNTER — Other Ambulatory Visit: Payer: Self-pay | Admitting: Unknown Physician Specialty

## 2019-06-22 DIAGNOSIS — Z1231 Encounter for screening mammogram for malignant neoplasm of breast: Secondary | ICD-10-CM

## 2019-07-04 DIAGNOSIS — S52601D Unspecified fracture of lower end of right ulna, subsequent encounter for closed fracture with routine healing: Secondary | ICD-10-CM | POA: Diagnosis not present

## 2019-07-04 DIAGNOSIS — S52501D Unspecified fracture of the lower end of right radius, subsequent encounter for closed fracture with routine healing: Secondary | ICD-10-CM | POA: Diagnosis not present

## 2019-07-11 ENCOUNTER — Other Ambulatory Visit: Payer: Medicare HMO

## 2019-07-11 ENCOUNTER — Other Ambulatory Visit: Payer: Self-pay

## 2019-07-11 ENCOUNTER — Encounter (INDEPENDENT_AMBULATORY_CARE_PROVIDER_SITE_OTHER): Payer: Self-pay

## 2019-07-11 DIAGNOSIS — Z79899 Other long term (current) drug therapy: Secondary | ICD-10-CM

## 2019-07-11 DIAGNOSIS — R079 Chest pain, unspecified: Secondary | ICD-10-CM | POA: Diagnosis not present

## 2019-07-11 DIAGNOSIS — E7800 Pure hypercholesterolemia, unspecified: Secondary | ICD-10-CM | POA: Diagnosis not present

## 2019-07-11 DIAGNOSIS — Z1231 Encounter for screening mammogram for malignant neoplasm of breast: Secondary | ICD-10-CM

## 2019-07-11 LAB — LIPID PANEL
Chol/HDL Ratio: 2.3 ratio (ref 0.0–4.4)
Cholesterol, Total: 130 mg/dL (ref 100–199)
HDL: 57 mg/dL (ref >39)
LDL Chol Calc (NIH): 55 mg/dL (ref 0–99)
Triglycerides: 100 mg/dL (ref 0–149)
VLDL Cholesterol Cal: 18 mg/dL (ref 5–40)

## 2019-07-11 LAB — HEPATIC FUNCTION PANEL
ALT: 11 IU/L (ref 0–32)
AST: 15 IU/L (ref 0–40)
Albumin: 3.9 g/dL (ref 3.8–4.8)
Alkaline Phosphatase: 63 IU/L (ref 39–117)
Bilirubin Total: 0.3 mg/dL (ref 0.0–1.2)
Bilirubin, Direct: 0.1 mg/dL (ref 0.00–0.40)
Total Protein: 6.4 g/dL (ref 6.0–8.5)

## 2019-07-12 ENCOUNTER — Ambulatory Visit: Payer: Self-pay | Admitting: Neurology

## 2019-07-12 DIAGNOSIS — G890 Central pain syndrome: Secondary | ICD-10-CM | POA: Diagnosis not present

## 2019-07-12 DIAGNOSIS — M542 Cervicalgia: Secondary | ICD-10-CM | POA: Diagnosis not present

## 2019-07-12 DIAGNOSIS — R519 Headache, unspecified: Secondary | ICD-10-CM | POA: Diagnosis not present

## 2019-07-12 DIAGNOSIS — G894 Chronic pain syndrome: Secondary | ICD-10-CM | POA: Diagnosis not present

## 2019-07-24 DIAGNOSIS — L110 Acquired keratosis follicularis: Secondary | ICD-10-CM | POA: Diagnosis not present

## 2019-07-24 DIAGNOSIS — M25775 Osteophyte, left foot: Secondary | ICD-10-CM | POA: Diagnosis not present

## 2019-07-24 DIAGNOSIS — M79672 Pain in left foot: Secondary | ICD-10-CM | POA: Diagnosis not present

## 2019-08-01 ENCOUNTER — Other Ambulatory Visit: Payer: Self-pay | Admitting: Gastroenterology

## 2019-08-02 DIAGNOSIS — Z20828 Contact with and (suspected) exposure to other viral communicable diseases: Secondary | ICD-10-CM | POA: Diagnosis not present

## 2019-08-16 DIAGNOSIS — S52501D Unspecified fracture of the lower end of right radius, subsequent encounter for closed fracture with routine healing: Secondary | ICD-10-CM | POA: Diagnosis not present

## 2019-08-16 DIAGNOSIS — S52601D Unspecified fracture of lower end of right ulna, subsequent encounter for closed fracture with routine healing: Secondary | ICD-10-CM | POA: Diagnosis not present

## 2019-08-17 ENCOUNTER — Ambulatory Visit: Payer: Medicare HMO

## 2019-09-10 ENCOUNTER — Other Ambulatory Visit: Payer: Self-pay | Admitting: Neurology

## 2019-09-20 ENCOUNTER — Other Ambulatory Visit: Payer: Self-pay

## 2019-09-20 ENCOUNTER — Ambulatory Visit: Payer: Medicare HMO | Admitting: Neurology

## 2019-09-20 ENCOUNTER — Encounter: Payer: Self-pay | Admitting: Neurology

## 2019-09-20 VITALS — BP 121/83 | HR 88 | Temp 97.5°F | Ht 62.0 in | Wt 143.5 lb

## 2019-09-20 DIAGNOSIS — I69354 Hemiplegia and hemiparesis following cerebral infarction affecting left non-dominant side: Secondary | ICD-10-CM | POA: Diagnosis not present

## 2019-09-20 MED ORDER — ABOBOTULINUMTOXINA 500 UNITS IM SOLR
1500.0000 [IU] | Freq: Once | INTRAMUSCULAR | Status: AC
  Administered 2019-09-20: 1500 [IU] via INTRAMUSCULAR

## 2019-09-20 MED ORDER — ABOBOTULINUMTOXINA 500 UNITS IM SOLR: 500.0000 [IU] | Freq: Once | INTRAMUSCULAR | Status: DC

## 2019-09-20 NOTE — Progress Notes (Signed)
**  Dysport 500 units x 3 vials, NDC [address]-6, Lot [id number], Exp 04/02/2020, office supply.//mck,rn**

## 2019-09-20 NOTE — Progress Notes (Signed)
[facility]  CC:  Spastic hemiplegia  [name] is a 69  y.o. 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 Marya Fossa, received EMG guided Botox injection in 2010 for about 2 years, with good benefit, but has difficulty with insurance  She began to receive Botox injection through our clinic by Dr. Hosie Poisson since May 2015,150 units total to left upper extremity, which has been very helpful  She is return for continued EMG guided Botox injection, at baseline, she can ambulate with a left AFO, but without assistance,  Profound left upper extremity spasticity, achiness, chronic pain, persistent left wrist flexion, finger flexion,  She also has a history of coronary artery disease in 2007, taking Plavix, and Aggrenox, exercise regularly, has a personal trainer  UPDATE March 2nd 2016: She received 300 units in Jun 13 2014, to left upper extremity, which has been very helpful, she did not notice significant side effect. The injection help her open her left hand better, no significant side effect noticed.  UPDATE June 8th 2016: She received 300 units of Botox A in March second 2016, responded very well, she was able to relax her left hand better, she ambulate without assistance with spastic left hemiparesis, complains of left facial, left foot pain, wants the injection to emphasize on her left finger flexion wrist flexion today.  UPDATE Sep 28th 2016: She responded to previous Botox injection very well in June, she wants to emphasize on her left upper extremity at this time, complains forceful finger contraction, flexion, elbow flexion. Only mild left shoulder pain with passive  movement.  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 Patient is concerned about potential side effect of weakness.  Update May 21 2016: She responded very well to previous injection in July, only wants to receive injection for spastic left upper extremity, she had a pair of left wrist/finger splint,  Update September 09 2016: She is now using a left wrist splint, which has been helpful,  UPDATE Dec 10 2016: She responded well to previous injection no significant side effect noticed,   UPDATE Apr 14 2017: She wear left wrist pain, denies significant side effect, complains of left-sided neuropathic pain, despite Prozac 20 mg, Nucynta 50 mg, gabapentin 600 mg twice a day,  UPDATE Jul 14 2017: She responded very well to previous injection in September 2018,  UPDATE October 13 2017: She responded very well to previous Botox injection  UPDATE January 26 2018: She complains of left leg weakness was injection, wants to focus injection on left upper extremity,  UPDATE May 11 2018: She responded well to previous injection  Update August 11, 2018: She responded well to previous injection, no  significant side effect noted  UPDATE January 03 2019: She responded very well to previous injection  Update April 12, 2019: She responded well to previous injection  UPDATE Sep 20 2019: She suffered left wrist fracture in November 2020, required surgery, missed her previous appointment,   Social History   Socioeconomic History  . Marital status: Married    Spouse name: Not on file  . Number of children: Not on file  . Years of education: Not on file  . Highest education level: Not on file  Occupational History  . Occupation: disabled  Tobacco Use  . Smoking status: Former Research scientist (life sciences)  . Smokeless tobacco: Never Used  . Tobacco comment: Quit in 2005  Substance and Sexual Activity  . Alcohol use: Yes    Comment: social  . Drug use: No  . Sexual activity: Not Currently  Other Topics Concern  . Not on file  Social History Narrative   Married, no children   Right handed   12th grade   1 cup daily   Social Determinants of Health   Financial Resource Strain:   . Difficulty of Paying Living Expenses: Not on file  Food Insecurity:   . Worried About Charity fundraiser in the Last Year: Not on file  . Ran Out of Food in the Last Year: Not on file  Transportation Needs:   . Lack of Transportation (Medical): Not on file  . Lack of Transportation (Non-Medical): Not on file  Physical Activity:   . Days of Exercise per Week: Not on file  . Minutes of Exercise per Session: Not on file  Stress:   . Feeling of Stress : Not on file  Social Connections:   . Frequency of Communication with Friends and Family: Not on file  . Frequency of Social Gatherings with Friends and Family: Not on file  . Attends Religious Services: Not on file  . Active Member of Clubs or Organizations: Not on file  . Attends Archivist Meetings: Not on file  . Marital Status: Not on file  Intimate Partner Violence:   . Fear of Current or Ex-Partner: Not on file  . Emotionally Abused: Not on file  . Physically Abused: Not on file  . Sexually Abused: Not on file    Family History  Problem Relation Age of Onset  . Arrhythmia Father        PPM  . Colon cancer Neg Hx      Past Medical History:  Diagnosis Date  . AMI (acute myocardial infarction) ([place]) 2007   failed PCI to the distal LAD - managed medically  . Chronic pain   . Complication of anesthesia   . CVA (cerebral vascular accident) ([place]) 1999  . Degenerative disc disease, cervical   . Hypothyroidism   . PONV (postoperative nausea and vomiting)   . Unstable angina [facility])     Past Surgical History:  Procedure Laterality Date  . COLONOSCOPY WITH PROPOFOL N/A 05/24/2018   PROPOFOL;  Surgeon: Danie Binder, MD; normal TI, significant looping of the colon, external hemorrhoids.  . ENDOMETRIAL ABLATION  3/06  . left ankle surgery      Current Outpatient Medications  Medication Sig Dispense Refill  . AbobotulinumtoxinA (DYSPORT) 500 units SOLR injection Inject 1,500 Units into the muscle every 3 (three) months.    . ALPRAZolam (XANAX) 0.5 MG tablet Take 0.5 mg by mouth at bedtime.     . AMITIZA 24 MCG capsule Take 1 capsule (24  mcg total) by mouth 2 (two) times daily with a meal. 180 capsule 3  . aspirin 81 MG tablet Take 81 mg by mouth daily.    Marland Kitchen CALCIUM PO Take 2 tablets by mouth 2 (two) times daily.    . cetirizine (ZYRTEC) 10 MG tablet Take 10 mg by mouth at bedtime.     . Cholecalciferol (VITAMIN D3) 1000 units CAPS Take 1,000 Units by mouth daily.    . clopidogrel (PLAVIX) 75 MG tablet Take 1 tablet (75 mg total) by mouth daily. 90 tablet 3  . DENTA 5000 PLUS 1.1 % CREA dental cream Place 1 application onto teeth at bedtime.   4  . diphenhydrAMINE (BENADRYL) 12.5 MG/5ML liquid Take 12.5 mg by mouth 4 (four) times daily as needed for allergies.    . DULoxetine (CYMBALTA) 60 MG capsule TAKE ONE CAPSULE BY MOUTH EVERY DAY 90 capsule 3  . FLUoxetine (PROZAC) 20 MG capsule Take 20 mg by mouth daily.      Marland Kitchen gabapentin (NEURONTIN) 600 MG tablet Take 1,200 mg by mouth at bedtime. Can take an extra 600mg  if needed    . guaiFENesin (MUCINEX) 600 MG 12 hr tablet Take 600 mg by mouth 2 (two) times  daily as needed for cough or to loosen phlegm.    HYDROCORTISONE ACE, RECTAL, 30 MG SUPP PLACE ONE SUPPOSITORY RECTALLY TWICE DAILY 28 suppository 2  . ibandronate (BONIVA) 150 MG tablet Take 150 mg by mouth every 30 (thirty) days. Take in the morning with a full glass of water, on an empty stomach, and do not take anything else by mouth or lie down for the next 30 min.    . IncobotulinumtoxinA (XEOMIN IM) Inject 500 Units into the muscle every 3 (three) months.    . levothyroxine (SYNTHROID, LEVOTHROID) 75 MCG tablet Take 75 mcg by mouth daily before breakfast.    . Menthol (ICY HOT) 5 % PTCH Apply 1 patch topically at bedtime.    . nitroGLYCERIN (NITROSTAT) 0.4 MG SL tablet Place 0.4 mg under the tongue every 5 (five) minutes as needed for chest pain.    . NUCYNTA 50 MG tablet Take 50 mg by mouth 4 (four) times daily as needed for severe pain.  0  . NUCYNTA ER 50 MG 12 hr tablet Take 50 mg by mouth every 12 (twelve) hours.   0  . pantoprazole (PROTONIX) 40 MG tablet Take 40 mg by mouth daily as needed (for acid reflux).     . ranolazine (RANEXA) 500 MG 12 hr tablet Take 1 tablet (500 mg total) by mouth daily. 90 tablet 3  . rOPINIRole (REQUIP) 2 MG tablet Take 2 mg by mouth at bedtime.     . fluticasone (FLONASE) 50 MCG/ACT nasal spray Place 2 sprays into both nostrils daily as needed for allergies.     . rosuvastatin (CRESTOR) 10 MG tablet Take 1 tablet (10 mg total) by mouth daily. 90 tablet 3   No current facility-administered medications for this visit.    Allergies as of 09/20/2019 - Review Complete 09/20/2019  Allergen Reaction Noted  . Morphine Other (See Comments) 01/01/2009  . Zolpidem tartrate Other (See Comments) 01/01/2009  . Penicillins Rash and Other (See Comments) 01/01/2009    Vitals: BP 121/83   Pulse 88   Temp (!) 97.5 F (36.4 C)   Ht 5\' 2"  (1.575 m)   Wt 143 lb 8 oz (65.1 kg)   BMI 26.25 kg/m  Last Weight:  Wt Readings  from Last 1 Encounters:  09/20/19 143  lb 8 oz (65.1 kg)   Last Height:   Ht Readings from Last 1 Encounters:  09/20/19 5\' 2"  (1.575 m)   PHYSICAL EXAMINATOINS:     Mentation: Alert oriented to time, place, history taking, and causual conversation, left lower face weakness  Cranial nerve II-XII: Pupils were equal round reactive to light extraocular movements were full, left visual field cut.   Left lower face weakness. hearing was intact to finger rubbing bilaterally. Uvula tongue midline.  head turning and shoulder shrug and were normal and symmetric.Tongue protrusion into cheek strength was normal.  Motor: spastic left hemiparesis,    left lower extremity moderate spasticity, left hip flexion 4, knee flexion 4, extension 5, Profound left upper extremity spasticity, left shoulder adduction, internal rotation.left elbow flexion, pronation, with passive movement, mild fixed contraction of left elbow, maximum 175, left wrist forceful flexion, finger flexion,thumb in position, even with forceful movement, she could not achieve full extension of her left finger  Gait: Rising up from seated position by pushing on chair arm,spastic left hemi-circumferential gait.  Deep tendon reflexes: hyperreflexia of left side   Assessment and plan:   69 years old right-handed Caucasian female, status post stroke, with spastic left hemiparesis since 1999, responded very well to previous EMG guided Botox injection, return to clinic for repeat injection  Under electric stimulation, 500 units of Dysport A x 3  was injected into left upper extremity muscles (500 units dysport/2.5cc of NS)  Left pronator teres 0.5cc x2=1.0 cc Left flexor digitorum profundus  0.5ccx2=1.0 cc Left palmaris longus 0.5cc Left brachialis  2.5 cc  Left pectoralis major 0.5ccx4=1.5 cc Left latissimus dorsi 0.5 cc x 2= 1.0 cc   2000, M.D. Ph.D.  [facility] [address] [place], [place] Phone: [phone] Fax:      [phone]

## 2019-09-29 ENCOUNTER — Ambulatory Visit: Payer: Medicare HMO | Attending: Internal Medicine

## 2019-09-29 DIAGNOSIS — Z23 Encounter for immunization: Secondary | ICD-10-CM | POA: Insufficient documentation

## 2019-09-29 NOTE — Progress Notes (Signed)
   Covid-19 Vaccination Clinic  Name:  [name]    MRN: [id number] DOB: 11-26-1950  09/29/2019  [name] was observed post Covid-19 immunization for 15 minutes without incidence. She was provided with Vaccine Information Sheet and instruction to access the V-Safe system.   [name] was instructed to call 911 with any severe reactions post vaccine: Marland Kitchen Difficulty breathing  . Swelling of your face and throat  . A fast heartbeat  . A bad rash all over your body  . Dizziness and weakness    Immunizations Administered    Name Date Dose VIS Date Route   Pfizer COVID-19 Vaccine 09/29/2019 12:37 PM 0.3 mL 07/14/2019 Intramuscular   Manufacturer: [employer], [employer]   Lot: [id number]   NDC: [address]-4

## 2019-10-06 NOTE — Progress Notes (Signed)
CARDIOLOGY OFFICE NOTE  Date:  10/10/2019    [name] Date of Birth: June 11, 1951 Medical Record #[id number]  PCP:  Selinda Flavin, MD  Cardiologist:  Tyrone Sage    Chief Complaint  Patient presents with  . Follow-up    History of Present Illness: [name] is a 69 y.o. female who presents today for a 6 month check.  She is a former patient of Dr. Vern Claude - now followed by me.   She hasa history ofCAD with past MI in 2007 and had failed PCI to the distal LAD - managed medically since -was using"PRN" Ranexa. On chronic DAPT with aspirin/Plavix.Other issues include prior CVA from 1999(on aspirin/Plavix) with considerable residual nerve pain, hypothyroidism, HLD and GERD.  When seen in January of 2017 washaving some chest pain. Her exercise tolerance was stable. We did get her Myoview updated and this looked ok.Sheremains mostly limited bysignificant nerve pain fromherremote stroke but she continues to "press on"in quite an amazing fashion.  [place] in [place]-having lots of symptoms . Husband noted a definite change with more fatigue and more complaints of chest pain. 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 Elected to start daily Ranexa versus proceeding with cardiac cath - she has not been able to tolerate BID dosing per her report due to "terrific headache".On return she was much improved and we agreed to continue with medical therapy. We did have to cut her Zocor back and she postponed her colonoscopy until symptoms resolved.Has tended to remain primarily limited by her nerve pain. When see by me in the office in December of 2019, she was happy with how she was doing. She was going into a research study for her lipids.We did a telehealth visit back in June - she was doing well but much more limited in ability to be active and do her rehab due to the pandemic and the gyms being  closed. I last saw her in September - she was doing ok - trying to get her activity level back up - had a trainer coming to the house.   The patient does not have symptoms concerning for COVID-19 infection (fever, chills, cough, or new shortness of breath).   Comes in today. Here alone. She is doing pretty well. [facility] Has had her first COVID vaccine. Only one spell of jaw pain - she took her dose of Ranexa and it resolved. It had not recurred. Breathing is good. She is riding her bike - does amazingly with her chronic nerve pain. No falls. Tolerating her medicines. Going to the research clinic later this month. She feels like she is doing well and has no real concerns.   Past Medical History:  Diagnosis Date  . AMI (acute myocardial infarction) (HCC) 2007   failed PCI to the distal LAD - managed medically  . Chronic pain   . Complication of anesthesia   . CVA (cerebral vascular accident) (HCC) 1999  . Degenerative disc disease, cervical   . Hypothyroidism   . PONV (postoperative nausea and vomiting)   . Unstable angina [facility])     Past Surgical History:  Procedure Laterality Date  . COLONOSCOPY WITH PROPOFOL N/A 05/24/2018   PROPOFOL;  Surgeon: West Bali, MD; normal TI, significant looping of the colon, external hemorrhoids.  . ENDOMETRIAL ABLATION  3/06  . left ankle surgery  Medications: Current Meds  Medication Sig  . AbobotulinumtoxinA (DYSPORT) 500 units SOLR injection Inject 1,500 Units into the muscle every 3 (three) months.  . ALPRAZolam (XANAX) 0.5 MG tablet Take 0.5 mg by mouth at bedtime.   . AMITIZA 24 MCG capsule Take 1 capsule (24 mcg total) by mouth 2 (two) times daily with a meal.  . aspirin 81 MG tablet Take 81 mg by mouth daily.  Marland Kitchen CALCIUM PO Take 2 tablets by mouth 2 (two) times daily.  . cetirizine (ZYRTEC) 10 MG tablet Take 10 mg by mouth at bedtime.   . Cholecalciferol (VITAMIN D3) 1000 units CAPS Take 1,000  Units by mouth daily.  . clopidogrel (PLAVIX) 75 MG tablet Take 1 tablet (75 mg total) by mouth daily.  . DENTA 5000 PLUS 1.1 % CREA dental cream Place 1 application onto teeth at bedtime.   . diphenhydrAMINE (BENADRYL) 12.5 MG/5ML liquid Take 12.5 mg by mouth 4 (four) times daily as needed for allergies.  . DULoxetine (CYMBALTA) 60 MG capsule TAKE ONE CAPSULE BY MOUTH EVERY DAY  . FLUoxetine (PROZAC) 20 MG capsule Take 20 mg by mouth daily.    . fluticasone (FLONASE) 50 MCG/ACT nasal spray Place 2 sprays into both nostrils daily as needed for allergies.   Marland Kitchen gabapentin (NEURONTIN) 600 MG tablet Take 1,200 mg by mouth at bedtime. Can take an extra 600mg  if needed  . guaiFENesin (MUCINEX) 600 MG 12 hr tablet Take 600 mg by mouth 2 (two) times daily as needed for cough or to loosen phlegm.  Marland Kitchen HYDROCORTISONE ACE, RECTAL, 30 MG SUPP PLACE ONE SUPPOSITORY RECTALLY TWICE DAILY  . ibandronate (BONIVA) 150 MG tablet Take 150 mg by mouth every 30 (thirty) days. Take in the morning with a full glass of water, on an empty stomach, and do not take anything else by mouth or lie down for the next 30 min.  Marland Kitchen levothyroxine (SYNTHROID, LEVOTHROID) 75 MCG tablet Take 75 mcg by mouth daily before breakfast.  . Menthol (ICY HOT) 5 % PTCH Apply 1 patch topically at bedtime.  . nitroGLYCERIN (NITROSTAT) 0.4 MG SL tablet Place 0.4 mg under the tongue every 5 (five) minutes as needed for chest pain.  . NUCYNTA 50 MG tablet Take 50 mg by mouth 4 (four) times daily as needed for severe pain.  Gean Birchwood ER 50 MG 12 hr tablet Take 50 mg by mouth every 12 (twelve) hours.   . pantoprazole (PROTONIX) 40 MG tablet Take 40 mg by mouth daily as needed (for acid reflux).   . ranolazine (RANEXA) 500 MG 12 hr tablet Take 1 tablet (500 mg total) by mouth daily.  Marland Kitchen rOPINIRole (REQUIP) 2 MG tablet Take 2 mg by mouth at bedtime.   . rosuvastatin (CRESTOR) 10 MG tablet Take 1 tablet (10 mg total) by mouth daily.      Allergies: Allergies  Allergen Reactions  . Morphine Other (See Comments)    Sick on stomach  . Zolpidem Tartrate Other (See Comments)    Sleep walk  . Penicillins Rash and Other (See Comments)    Has patient had a PCN reaction causing immediate rash, facial/tongue/throat swelling, SOB or lightheadedness with hypotension: Unknown Has patient had a PCN reaction causing severe rash involving mucus membranes or skin necrosis: No Has patient had a PCN reaction that required hospitalization: No Has patient had a PCN reaction occurring within the last 10 years: No If all of the above answers are "NO", then may proceed with Cephalosporin use.  Social History: The patient  reports that she has quit smoking. She has never used smokeless tobacco. She reports current alcohol use. She reports that she does not use drugs.   Family History: The patient's family history includes Arrhythmia in her father.   Review of Systems: Please see the history of present illness.   All other systems are reviewed and negative.   Physical Exam: VS:  BP 108/82   Pulse 88   Ht 5\' 2"  (1.575 m)   Wt 143 lb 12.8 oz (65.2 kg)   SpO2 95%   BMI 26.30 kg/m  .  BMI Body mass index is 26.3 kg/m.  Wt Readings from Last 3 Encounters:  10/10/19 143 lb 12.8 oz (65.2 kg)  09/20/19 143 lb 8 oz (65.1 kg)  05/29/19 145 lb 6.4 oz (66 kg)    General: Pleasant. Alert and in no acute distress.   Cardiac: Regular rate and rhythm. No murmurs, rubs, or gallops. No edema.  Respiratory:  Lungs are clear to auscultation bilaterally with normal work of breathing.  GI: Soft and nontender.  MS: No deformity or atrophy. Gait and ROM intact - she has a chronic limp.  Skin: Warm and dry. Color is normal.  Neuro:  Strength and sensation are intact and chronic left sided weakness.   Psych: Alert, appropriate and with normal affect.   LABORATORY DATA:  EKG:  EKG is not ordered today.  Lab Results  Component Value  Date   WBC 7.7 11/17/2017   HGB 13.9 11/17/2017   HCT 41.0 11/17/2017   PLT 307 11/17/2017   GLUCOSE 97 04/19/2018   CHOL 130 07/11/2019   TRIG 100 07/11/2019   HDL 57 07/11/2019   LDLCALC 55 07/11/2019   ALT 11 07/11/2019   AST 15 07/11/2019   NA 138 04/19/2018   K 4.5 04/19/2018   CL 99 04/19/2018   CREATININE 0.83 04/19/2018   BUN 12 04/19/2018   CO2 26 04/19/2018   TSH 0.940 11/17/2017     BNP (last 3 results) No results for input(s): BNP in the last 8760 hours.  ProBNP (last 3 results) No results for input(s): PROBNP in the last 8760 hours.   Other Studies Reviewed Today:  MyoviewStudy Result1/2017   No diagnostic ST segment changes to indicate ischemia.  Small, mild intensity, lateral apical region of ischemia in the setting of artifact from extracardiac radiotracer uptake and also arms down imaging. No large ischemic territories.  This is a low risk study.  Nuclear stress EF: 74%.     Echo Study Conclusions from 2011 Left ventricle: The cavity size was normal. Systolic function was normal. The estimated ejection fraction was in the range of 55% to 60%. Wall motion was normal; there were no regional wall motion abnormalities.    CARDIAC CATHETERIZATION 2007  HEMODYNAMICS:  1. Left main coronary artery: The left main coronary artery is free of  significant disease.  2. Left anterior descending artery: The left anterior descending artery  gave rise to two diagonal branches and a septal perforator. The LAD was  quite tortuous in its mid portion and then had a 99% occlusion in its  distal portion with TIMI-I flow.  3. Circumflex artery: The circumflex artery gave rise to an atrial branch,  marginal branch, and two posterolateral branches. These vessels were  free of significant disease.  4. Right coronary artery: The right coronary artery is a moderate size  vessel which gave rise to a clonus branch, right ventricular branch,  posterior descending branch, and a posterolateral branch. This vessel  was free of significant disease.  LEFT VENTRICULOGRAM: The left ventriculogram was obtained in the RAO  projection showed akinesis of the tip of the apex in the inferoapical  segment. The overall wall motion was good with an estimated ejection  fraction of 60%.  Following attempt at PCA there was persistent 99% stenosis in the distal LAD  with TIMI-I flow.  CONCLUSION:  1. Coronary artery disease, status post recent non-ST-elevation myocardial  infarction with 99% stenosis in the distal left anterior descending with  TIMI-I flow, no significant obstruction in the circumflex and right  coronary arteries, and inferoapical wall akinesis.  2. Unsuccessful percutaneous coronary intervention of the lesion in the  distal left anterior descending due to inability to cross with the wire.  DISPOSITION: The patient returned to the postanesthesia care unit for  further observation. I think her outlook should be good on medical therapy.  She has no significant residual coronary disease and overall LV function is  good.  ______________________________  Charlies Constable, M.D. [facility]  BB/MEDQ D: 08/25/2005 T: 08/25/2005 Job: [id number]     ASSESSMENT & PLAN:   1. CAD - remote NSTEMI - had unsuccessful PCI to distal LAD from 2007 - no residual disease at that time - has been managed medically with chronic DAPT and chronic Ranexa. She has not wished to proceed with cath due to prior stroke with considerable disability. Her symptoms are stable. No changes made today. She does a very good job at CV risk factor modification.   2. HLD - on statin - she is in a research study as well. Seems to tolerate Crestor much better - would continue. Labs from December reviewed.   3. Prior stroke - previously on Coumadin and has been on Aggrenox - now on chronic Plavix and aspirin with her CAD - she remains limited by chronic  nerve pain with chronic left sided weakness - she does an amazing job in my opinion with trying to stay active.   4. Chronic nerve pain - see above.   5. COVID-19 Education: The signs and symptoms of COVID-19 were discussed with the patient and how to seek care for testing (follow up with PCP or arrange E-visit).  The importance of social distancing, staying at home, hand hygiene and wearing a mask when out in public were discussed today. She has had her first vaccine.   Current medicines are reviewed with the patient today.  The patient does not have concerns regarding medicines other than what has been noted above.  The following changes have been made:  See above.  Labs/ tests ordered today include:   No orders of the defined types were placed in this encounter.    Disposition:   FU with me in 6 months.   Patient is agreeable to this plan and will call if any problems develop in the interim.   SignedNorma Fredrickson, NP  10/10/2019 10:45 AM  [facility] [address] [place], [place]  [address] Phone: [phone] Fax: [phone]

## 2019-10-10 ENCOUNTER — Encounter (INDEPENDENT_AMBULATORY_CARE_PROVIDER_SITE_OTHER): Payer: Self-pay

## 2019-10-10 ENCOUNTER — Encounter: Payer: Self-pay | Admitting: Nurse Practitioner

## 2019-10-10 ENCOUNTER — Ambulatory Visit: Payer: Medicare HMO | Admitting: Nurse Practitioner

## 2019-10-10 ENCOUNTER — Other Ambulatory Visit: Payer: Self-pay

## 2019-10-10 VITALS — BP 108/82 | HR 88 | Ht 62.0 in | Wt 143.8 lb

## 2019-10-10 DIAGNOSIS — Z79899 Other long term (current) drug therapy: Secondary | ICD-10-CM | POA: Diagnosis not present

## 2019-10-10 DIAGNOSIS — E7800 Pure hypercholesterolemia, unspecified: Secondary | ICD-10-CM

## 2019-10-10 DIAGNOSIS — I259 Chronic ischemic heart disease, unspecified: Secondary | ICD-10-CM

## 2019-10-10 DIAGNOSIS — Z7189 Other specified counseling: Secondary | ICD-10-CM | POA: Diagnosis not present

## 2019-10-10 NOTE — Patient Instructions (Addendum)

## 2019-10-16 ENCOUNTER — Encounter: Payer: Self-pay | Admitting: Gastroenterology

## 2019-10-17 ENCOUNTER — Other Ambulatory Visit: Payer: Self-pay

## 2019-10-17 ENCOUNTER — Telehealth: Payer: Self-pay | Admitting: Gastroenterology

## 2019-10-17 NOTE — Telephone Encounter (Signed)
Spoke with pt. Amitiza isn't covered under insurance. Will submit a PA.

## 2019-10-17 NOTE — Telephone Encounter (Signed)
PA for Amitiza was approved. Pt was noticed.

## 2019-10-17 NOTE — Telephone Encounter (Signed)
PA was submitted. Waiting on an approval or denial.

## 2019-10-17 NOTE — Telephone Encounter (Signed)
Patient called and stated [facility] will not pay for her amiteza, may need prior auth

## 2019-10-24 ENCOUNTER — Other Ambulatory Visit: Payer: Self-pay

## 2019-10-24 ENCOUNTER — Encounter: Payer: Medicare HMO | Admitting: *Deleted

## 2019-10-24 ENCOUNTER — Ambulatory Visit: Payer: Medicare HMO | Attending: Internal Medicine

## 2019-10-24 DIAGNOSIS — Z23 Encounter for immunization: Secondary | ICD-10-CM

## 2019-10-24 DIAGNOSIS — Z006 Encounter for examination for normal comparison and control in clinical research program: Secondary | ICD-10-CM

## 2019-10-24 NOTE — Progress Notes (Signed)
   Covid-19 Vaccination Clinic  Name:  [name]    MRN: [id number] DOB: [birth year]  10/24/2019  [name] was observed post Covid-19 immunization for 15 minutes without incident. She was provided with Vaccine Information Sheet and instruction to access the V-Safe system.   [name] was instructed to call 911 with any severe reactions post vaccine: Marland Kitchen Difficulty breathing  . Swelling of face and throat  . A fast heartbeat  . A bad rash all over body  . Dizziness and weakness   Immunizations Administered    Name Date Dose VIS Date Route   Pfizer COVID-19 Vaccine 10/24/2019  3:05 PM 0.3 mL 07/14/2019 Intramuscular   Manufacturer: [employer], [employer]   Lot: [id number]   NDC: [address]-9

## 2019-10-24 NOTE — Research (Signed)
Patient to research clinic for visit 15M in the Or[employer] research study.  Injection given and patient tolerated well. All questions answered and next appointment scheduled.  

## 2019-12-05 ENCOUNTER — Telehealth: Payer: Self-pay | Admitting: *Deleted

## 2019-12-05 NOTE — Telephone Encounter (Signed)
Patient has a Dysport appointment on 12/20/2019.  I called [employer] [phone] and spoke to Olive.  She states that [id number] and [id number] are valid and billable.  [id number] will require PA.  Call [phone] to obtain PA.  Ref# for call is [id number].  I called [phone] and spoke to Benton. She states that there is a PA on file .  PA# is [id number] Valid from 12/28/2018-12/28/2019 for 4 visits to be B/B.

## 2019-12-20 ENCOUNTER — Other Ambulatory Visit: Payer: Self-pay

## 2019-12-20 ENCOUNTER — Encounter: Payer: Self-pay | Admitting: Neurology

## 2019-12-20 ENCOUNTER — Ambulatory Visit: Payer: Medicare HMO | Admitting: Neurology

## 2019-12-20 VITALS — BP 128/80 | HR 81 | Ht 62.0 in | Wt 141.0 lb

## 2019-12-20 DIAGNOSIS — I69354 Hemiplegia and hemiparesis following cerebral infarction affecting left non-dominant side: Secondary | ICD-10-CM

## 2019-12-20 MED ORDER — ABOBOTULINUMTOXINA 500 UNITS IM SOLR: 1500.0000 [IU] | Freq: Once | INTRAMUSCULAR | Status: DC

## 2019-12-20 NOTE — Progress Notes (Signed)
[facility]  CC:  Spastic hemiplegia  [name] is a 69  y.o. Right-handed Caucasian female, return for EMG guided Botox injection for her left spastic hemiparesis,  She suffered stroke in 1999, with residual left spastic hemiparesis, upper extremity more than leg, left visual field cut  She previously was enrolled in Botox research study for spastic upper extremity at [facility] by Dr. Marya Fossa, received EMG guided Botox injection in 2010 for about 2 years, with good benefit, but has difficulty with insurance  She began to receive Botox injection through our clinic by Dr. Hosie Poisson since May 2015,150 units total to left upper extremity, which has been very helpful  She is return for continued EMG guided Botox injection, at baseline, she can ambulate with a left AFO, but without assistance,  Profound left upper extremity spasticity, achiness, chronic pain, persistent left wrist flexion, finger flexion,  She also has a history of coronary artery disease in 2007, taking Plavix, and Aggrenox, exercise regularly, has a personal trainer  UPDATE March 2nd 2016: She received 300 units in Jun 13 2014, to left upper extremity, which has been very helpful, she did not notice significant side effect. The injection help her open her left hand better, no significant side effect noticed.  UPDATE June 8th 2016: She received 300 units of Botox A in March second 2016, responded very well, she was able to relax her left hand better, she ambulate without assistance with spastic left hemiparesis, complains of left facial, left foot pain, wants the injection to emphasize on her left finger flexion wrist flexion today.  UPDATE Sep 28th 2016: She responded to previous Botox injection very well in June, she wants to emphasize on her left upper extremity at this time, complains forceful finger contraction, flexion, elbow flexion. Only mild left shoulder pain with passive movement.   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 Patient is concerned about potential side effect of weakness.  Update May 21 2016: She responded very well to previous injection in July, only wants to receive injection for spastic left upper extremity, she had a pair of left wrist/finger splint,  Update September 09 2016: She is now using a left wrist splint, which has been helpful,  UPDATE Dec 10 2016: She responded well to previous injection no significant side effect noticed,   UPDATE Apr 14 2017: She wear left wrist pain, denies significant side effect, complains of left-sided neuropathic pain, despite Prozac 20 mg, Nucynta 50 mg, gabapentin 600 mg twice a day,  UPDATE Jul 14 2017: She responded very well to previous injection in September 2018,  UPDATE October 13 2017: She responded very well to previous Botox injection  UPDATE January 26 2018: She complains of left leg weakness was injection, wants to focus injection on left upper extremity,  UPDATE May 11 2018: She responded well to previous injection  Update August 11, 2018: She responded well to previous injection, no  significant side effect noted  UPDATE January 03 2019: She responded very well to previous injection  Update April 12, 2019: She responded well to previous injection  UPDATE Sep 20 2019: She  suffered left wrist fracture in November 2020, required surgery, missed her previous appointment,  UPDATE Dec 20 2019: She did well with previous injection   Social History   Socioeconomic History  . Marital status: Married    Spouse name: Not on file  . Number of children: Not on file  . Years of education: Not on file  . Highest education level: Not on file  Occupational History  . Occupation: disabled  Tobacco Use  . Smoking status: Former Games developer  . Smokeless tobacco: Never Used  . Tobacco comment: Quit in 2005  Substance and Sexual Activity  . Alcohol use: Yes    Comment: social  . Drug use: No  . Sexual activity: Not Currently  Other Topics Concern  . Not on file  Social History Narrative   Married, no children   Right handed   12th grade   1 cup daily   Social Determinants of Health   Financial Resource Strain:   . Difficulty of Paying Living Expenses:   Food Insecurity:   . Worried About Programme researcher, broadcasting/film/video in the Last Year:   . Barista in the Last Year:   Transportation Needs:   . Freight forwarder (Medical):   Marland Kitchen Lack of Transportation (Non-Medical):   Physical Activity:   . Days of Exercise per Week:   . Minutes of Exercise per Session:   Stress:   . Feeling of Stress :   Social Connections:   . Frequency of Communication with Friends and Family:   . Frequency of Social Gatherings with Friends and Family:   . Attends Religious Services:   . Active Member of Clubs or Organizations:   . Attends Banker Meetings:   Marland Kitchen Marital Status:   Intimate Partner Violence:   . Fear of Current or Ex-Partner:   . Emotionally Abused:   Marland Kitchen Physically Abused:   . Sexually Abused:     Family History  Problem Relation Age of Onset  . Arrhythmia Father        PPM  . Colon cancer Neg Hx     Past Medical History:  Diagnosis Date  . AMI (acute myocardial infarction) (HCC) 2007   failed PCI to the distal LAD - managed medically  .  Chronic pain   . Complication of anesthesia   . CVA (cerebral vascular accident) (HCC) 1999  . Degenerative disc disease, cervical   . Hypothyroidism   . PONV (postoperative nausea and vomiting)   . Unstable angina [facility])     Past Surgical History:  Procedure Laterality Date  . COLONOSCOPY WITH PROPOFOL N/A 05/24/2018   PROPOFOL;  Surgeon: West Bali, MD; normal TI, significant looping of the colon, external hemorrhoids.  . ENDOMETRIAL ABLATION  3/06  . left ankle surgery      Current Outpatient Medications  Medication Sig Dispense Refill  . AbobotulinumtoxinA (DYSPORT) 500 units SOLR injection Inject 1,500 Units into the muscle every 3 (three) months.    . ALPRAZolam (XANAX) 0.5 MG tablet Take 0.5 mg by mouth at bedtime.     . AMITIZA 24 MCG capsule Take 1 capsule (24 mcg total) by mouth 2 (two) times daily with a meal. 180 capsule 3  . aspirin 81 MG tablet Take 81 mg by mouth  daily.    Marland Kitchen CALCIUM PO Take 2 tablets by mouth 2 (two) times daily.    . cetirizine (ZYRTEC) 10 MG tablet Take 10 mg by mouth at bedtime.     . Cholecalciferol (VITAMIN D3) 1000 units CAPS Take 1,000 Units by mouth daily.    . clopidogrel (PLAVIX) 75 MG tablet Take 1 tablet (75 mg total) by mouth daily. 90 tablet 3  . DENTA 5000 PLUS 1.1 % CREA dental cream Place 1 application onto teeth at bedtime.   4  . diphenhydrAMINE (BENADRYL) 12.5 MG/5ML liquid Take 12.5 mg by mouth 4 (four) times daily as needed for allergies.    . DULoxetine (CYMBALTA) 60 MG capsule TAKE ONE CAPSULE BY MOUTH EVERY DAY 90 capsule 3  . FLUoxetine (PROZAC) 20 MG capsule Take 20 mg by mouth daily.      Marland Kitchen gabapentin (NEURONTIN) 600 MG tablet Take 1,200 mg by mouth at bedtime. Can take an extra 600mg  if needed    . guaiFENesin (MUCINEX) 600 MG 12 hr tablet Take 600 mg by mouth 2 (two) times daily as needed for cough or to loosen phlegm.    Marland Kitchen HYDROCORTISONE ACE, RECTAL, 30 MG SUPP PLACE ONE SUPPOSITORY RECTALLY TWICE DAILY 28 suppository  2  . ibandronate (BONIVA) 150 MG tablet Take 150 mg by mouth every 30 (thirty) days. Take in the morning with a full glass of water, on an empty stomach, and do not take anything else by mouth or lie down for the next 30 min.    Marland Kitchen levothyroxine (SYNTHROID, LEVOTHROID) 75 MCG tablet Take 75 mcg by mouth daily before breakfast.    . Menthol (ICY HOT) 5 % PTCH Apply 1 patch topically at bedtime.    . nitroGLYCERIN (NITROSTAT) 0.4 MG SL tablet Place 0.4 mg under the tongue every 5 (five) minutes as needed for chest pain.    . NUCYNTA 50 MG tablet Take 50 mg by mouth 4 (four) times daily as needed for severe pain.  0  . NUCYNTA ER 50 MG 12 hr tablet Take 50 mg by mouth every 12 (twelve) hours.   0  . pantoprazole (PROTONIX) 40 MG tablet Take 40 mg by mouth daily as needed (for acid reflux).     . ranolazine (RANEXA) 500 MG 12 hr tablet Take 1 tablet (500 mg total) by mouth daily. 90 tablet 3  . rOPINIRole (REQUIP) 2 MG tablet Take 2 mg by mouth at bedtime.     . fluticasone (FLONASE) 50 MCG/ACT nasal spray Place 2 sprays into both nostrils daily as needed for allergies.     . rosuvastatin (CRESTOR) 10 MG tablet Take 1 tablet (10 mg total) by mouth daily. 90 tablet 3   No current facility-administered medications for this visit.    Allergies as of 12/20/2019 - Review Complete 12/20/2019  Allergen Reaction Noted  . Morphine Other (See Comments) 01/01/2009  . Zolpidem tartrate Other (See Comments) 01/01/2009  . Penicillins Rash and Other (See Comments) 01/01/2009    Vitals: BP 128/80   Pulse 81   Ht 5\' 2"  (1.575 m)   Wt 141 lb (64 kg)   BMI 25.79 kg/m  Last Weight:  Wt Readings from Last 1 Encounters:  12/20/19 141 lb (64 kg)   Last Height:   Ht Readings from Last 1 Encounters:  12/20/19 5\' 2"  (1.575 m)   PHYSICAL EXAMINATOINS:     Mentation: Alert oriented to time, place, history taking, and causual conversation, left lower face weakness  Cranial nerve II-XII: Pupils were equal  round reactive to light extraocular movements were full, left visual field cut.   Left lower face weakness. hearing was intact to finger rubbing bilaterally. Uvula tongue midline.  head turning and shoulder shrug and were normal and symmetric.Tongue protrusion into cheek strength was normal.  Motor: spastic left hemiparesis,    left lower extremity moderate spasticity, left hip flexion 4, knee flexion 4, extension 5, Profound left upper extremity spasticity, left shoulder adduction, internal rotation.left elbow flexion, pronation, with passive movement, mild fixed contraction of left elbow, maximum 175, left wrist forceful flexion, finger flexion,thumb in position, even with forceful movement, she could not achieve full extension of her left finger  Gait: Rising up from seated position by pushing on chair arm,spastic left hemi-circumferential gait.  Deep tendon reflexes: hyperreflexia of left side   Assessment and plan:   69 years old right-handed Caucasian female, status post stroke, with spastic left hemiparesis since 1999, responded very well to previous EMG guided Botox injection, return to clinic for repeat injection  Under electric stimulation, 500 units of Dysport A x 3  was injected into left upper extremity muscles (500 units dysport/2.5cc of NS)  Left pronator teres 0.5cc x2=1.0 cc Left flexor digitorum profundus  0.5ccx2=1.0 cc Left palmaris longus 0.5cc Left brachialis  2.0 cc  Left pectoralis major 0.5ccx4=1.5 cc Left latissimus dorsi 0.5 cc x 3= 1.5 cc   [name], M.D. Ph.D.  [facility] [address] [place], [place] [address] Phone: [phone] Fax:      [phone]

## 2019-12-20 NOTE — Progress Notes (Signed)
**  Dysport 500 units x 3 vials, NDC [address]-1,Lot [id number], 08/02/2020, office supply.//mck,rn**

## 2020-01-22 ENCOUNTER — Other Ambulatory Visit: Payer: Self-pay | Admitting: Gastroenterology

## 2020-01-22 ENCOUNTER — Other Ambulatory Visit: Payer: Self-pay | Admitting: Nurse Practitioner

## 2020-01-22 DIAGNOSIS — I259 Chronic ischemic heart disease, unspecified: Secondary | ICD-10-CM

## 2020-01-22 DIAGNOSIS — K5909 Other constipation: Secondary | ICD-10-CM

## 2020-01-22 DIAGNOSIS — E7800 Pure hypercholesterolemia, unspecified: Secondary | ICD-10-CM

## 2020-01-29 ENCOUNTER — Other Ambulatory Visit: Payer: Self-pay

## 2020-01-29 MED ORDER — RANOLAZINE ER 500 MG PO TB12: 500.0000 mg | ORAL_TABLET | Freq: Every day | ORAL | 2 refills | Status: DC

## 2020-01-29 NOTE — Telephone Encounter (Signed)
Pt's medication was sent to pt's pharmacy as requested. Confirmation received.  °

## 2020-03-26 ENCOUNTER — Ambulatory Visit: Payer: Self-pay | Admitting: Neurology

## 2020-03-27 ENCOUNTER — Ambulatory Visit: Payer: Self-pay | Admitting: Neurology

## 2020-03-28 ENCOUNTER — Encounter: Payer: Self-pay | Admitting: *Deleted

## 2020-03-28 DIAGNOSIS — Z006 Encounter for examination for normal comparison and control in clinical research program: Secondary | ICD-10-CM

## 2020-04-15 NOTE — Progress Notes (Signed)
CARDIOLOGY OFFICE NOTE  Date:  04/23/2020    [name] Date of Birth: 1950/11/12 Medical Record #[id number]  PCP:  Selinda Flavin, MD  Cardiologist:  Tyrone Sage   Chief Complaint  Patient presents with  . Follow-up    History of Present Illness: [name] is a 69 y.o. female who presents today for a follow up visit. She is a former patient of Dr. Vern Claude - now followed byme.   She hasa history ofCAD with past MI in 2007 and had failed PCI to the distal LAD - managed medicallysince-was using"PRN" Ranexa.On chronic DAPT with aspirin/Plavix.Other issues include prior CVA from 1999(on long term aspirin/Plavix)with considerable residual nerve pain, hypothyroidism, HLD and GERD.  When seen in January of 2017 washaving some chest pain. Her exercise tolerance was stable. We did get her Myoview updated and this looked ok.Sheremains mostly limited bysignificant nerve pain fromherremote stroke but she continues to "press on"in quite an amazing fashion.  [place] work in visit-having lots of symptoms . Husband noted a definite change with more fatigue and more complaints of chest pain. 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 Elected to start daily Ranexa versus proceeding with cardiac cath due to risk for stroke - she has not been able to tolerate BID dosing per her report due to "terrific headache".On return she was much improved and weagreed to continue withmedical therapy. We did have to cut her Zocor back and she postponed her colonoscopy until symptoms resolved.Has tended to remain primarilylimited by her nerve pain. I last saw her in March - she was doing well. Very little jaw pain (her angina) and has elected to continue with medical management and not pursue cardiac cath.   Comes in today. Here with her husband. She has been doing ok over the past 6 months. Has only had 2  spells of jaw pain (her angina) since last seen. She is back at the gym. She has lost some strength on her left side - especially left leg - due to COVID/pandemic/closed gym but this is improving. He notes that she is stumbling a little more - did have a fall yesterday while crossing the threshold at [facility] - fortunately, did not get hurt. Her breathing is stable. She feels good on her regimen. BP is ok. Weight is down a few pounds. Asking about 3rd vaccine for COVID 19. Did just return from [place] for a friend's birthday. Overall, they are both pleased with how she is doing. Headed to the Research clinic after this visit with me.   Past Medical History:  Diagnosis Date  . AMI (acute myocardial infarction) (HCC) 2007   failed PCI to the distal LAD - managed medically  . Chronic pain   . Complication of anesthesia   . CVA (cerebral vascular accident) (HCC) 1999  . Degenerative disc disease, cervical   . Hypothyroidism   . PONV (postoperative nausea and vomiting)   . Unstable angina [facility])     Past Surgical History:  Procedure Laterality Date  . COLONOSCOPY WITH PROPOFOL N/A 05/24/2018   PROPOFOL;  Surgeon: West Bali, MD; normal TI, significant looping of the colon, external hemorrhoids.  . ENDOMETRIAL ABLATION  3/06  . left ankle surgery       Medications: Current Meds  Medication Sig  . AbobotulinumtoxinA (DYSPORT) 500 units SOLR injection Inject 1,500 Units into the muscle every 3 (three) months.  Marland Kitchen  ALPRAZolam (XANAX) 0.5 MG tablet Take 0.5 mg by mouth at bedtime.   . AMITIZA 24 MCG capsule TAKE 1 CAPSULE TWICE DAILY WITH MEALS  . aspirin 81 MG tablet Take 81 mg by mouth daily.  Marland Kitchen CALCIUM PO Take 2 tablets by mouth 2 (two) times daily.  . cetirizine (ZYRTEC) 10 MG tablet Take 10 mg by mouth at bedtime.   . Cholecalciferol (VITAMIN D3) 1000 units CAPS Take 1,000 Units by mouth daily.  . clopidogrel (PLAVIX) 75 MG tablet Take 1 tablet (75 mg total) by mouth daily.  .  DENTA 5000 PLUS 1.1 % CREA dental cream Place 1 application onto teeth at bedtime.   . diclofenac Sodium (VOLTAREN) 1 % GEL SMARTSIG:4 Gram(s) Topical 3 Times Daily PRN  . diphenhydrAMINE (BENADRYL) 12.5 MG/5ML liquid Take 12.5 mg by mouth 4 (four) times daily as needed for allergies.  . DULoxetine (CYMBALTA) 60 MG capsule TAKE ONE CAPSULE BY MOUTH EVERY DAY  . FLUoxetine (PROZAC) 20 MG capsule Take 20 mg by mouth daily.    Marland Kitchen gabapentin (NEURONTIN) 600 MG tablet Take 1,200 mg by mouth at bedtime. Can take an extra 600mg  if needed  . guaiFENesin (MUCINEX) 600 MG 12 hr tablet Take 600 mg by mouth 2 (two) times daily as needed for cough or to loosen phlegm.  HYDROCORTISONE ACE, RECTAL, 30 MG SUPP PLACE ONE SUPPOSITORY RECTALLY TWICE DAILY  . ibandronate (BONIVA) 150 MG tablet Take 150 mg by mouth every 30 (thirty) days. Take in the morning with a full glass of water, on an empty stomach, and do not take anything else by mouth or lie down for the next 30 min.  Marland Kitchen levothyroxine (SYNTHROID, LEVOTHROID) 75 MCG tablet Take 75 mcg by mouth daily before breakfast.  . Menthol (ICY HOT) 5 % PTCH Apply 1 patch topically at bedtime.  . metroNIDAZOLE (METROGEL) 0.75 % vaginal gel Place vaginally at bedtime.  . nitroGLYCERIN (NITROSTAT) 0.4 MG SL tablet Place 0.4 mg under the tongue every 5 (five) minutes as needed for chest pain.  . NUCYNTA 50 MG tablet Take 50 mg by mouth 4 (four) times daily as needed for severe pain.  Marland Kitchen ER 50 MG 12 hr tablet Take 50 mg by mouth every 12 (twelve) hours.   . pantoprazole (PROTONIX) 40 MG tablet Take 40 mg by mouth daily as needed (for acid reflux).   . ranolazine (RANEXA) 500 MG 12 hr tablet Take 1 tablet (500 mg total) by mouth daily.  Raylene Miyamoto rOPINIRole (REQUIP) 2 MG tablet Take 2 mg by mouth at bedtime.   . rosuvastatin (CRESTOR) 10 MG tablet TAKE 1 TABLET DAILY  . Study - ORION 4 - inclisiran 300 mg/1.31mL or placebo SQ injection (PI-Stuckey) Inject 300 mg into the skin  every 6 (six) months.  . triamcinolone ointment (KENALOG) 0.1 % SMARTSIG:Sparingly Topical 2-3 Times Daily   Current Facility-Administered Medications for the 04/23/20 encounter (Office Visit) with 04/25/20, NP  Medication  . AbobotulinumtoxinA (DYSPORT) 500 units injection 1,500 Units     Allergies: Allergies  Allergen Reactions  . Morphine Other (See Comments)    Sick on stomach  . Zolpidem Tartrate Other (See Comments)    Sleep walk  . Penicillins Rash and Other (See Comments)    Has patient had a PCN reaction causing immediate rash, facial/tongue/throat swelling, SOB or lightheadedness with hypotension: Unknown Has patient had a PCN reaction causing severe rash involving mucus membranes or skin necrosis: No Has patient had a PCN  reaction that required hospitalization: No Has patient had a PCN reaction occurring within the last 10 years: No If all of the above answers are "NO", then may proceed with Cephalosporin use.     Social History: The patient  reports that she has quit smoking. She has never used smokeless tobacco. She reports current alcohol use. She reports that she does not use drugs.   Family History: The patient's family history includes Arrhythmia in her father.   Review of Systems: Please see the history of present illness.   All other systems are reviewed and negative.   Physical Exam: VS:  BP 138/80   Pulse 76   Ht 5\' 2"  (1.575 m)   Wt 140 lb 3.2 oz (63.6 kg)   SpO2 93%   BMI 25.64 kg/m  .  BMI Body mass index is 25.64 kg/m.  Wt Readings from Last 3 Encounters:  04/23/20 140 lb 3.2 oz (63.6 kg)  12/20/19 141 lb (64 kg)  10/10/19 143 lb 12.8 oz (65.2 kg)    General: Pleasant. Alert and in no acute distress.   Cardiac: Regular rate and rhythm. No murmurs, rubs, or gallops. No edema.  Respiratory:  Lungs are clear to auscultation bilaterally with normal work of breathing.  GI: Soft and nontender.  MS: No deformity or atrophy but has left  sided weakness from prior stroke.  Gait and ROM intact.  Skin: Warm and dry. Color is normal.  Neuro:  Strength and sensation are intact and no gross focal deficits noted.  Psych: Alert, appropriate and with normal affect.   LABORATORY DATA:  EKG:  EKG is ordered today.  Personally reviewed by me. This demonstrates NSR - HR is 76 - QT is 378ms.  Lab Results  Component Value Date   WBC 7.7 11/17/2017   HGB 13.9 11/17/2017   HCT 41.0 11/17/2017   PLT 307 11/17/2017   GLUCOSE 97 04/19/2018   CHOL 130 07/11/2019   TRIG 100 07/11/2019   HDL 57 07/11/2019   LDLCALC 55 07/11/2019   ALT 11 07/11/2019   AST 15 07/11/2019   NA 138 04/19/2018   K 4.5 04/19/2018   CL 99 04/19/2018   CREATININE 0.83 04/19/2018   BUN 12 04/19/2018   CO2 26 04/19/2018   TSH 0.940 11/17/2017     BNP (last 3 results) No results for input(s): BNP in the last 8760 hours.  ProBNP (last 3 results) No results for input(s): PROBNP in the last 8760 hours.   Other Studies Reviewed Today:  MyoviewStudy Result1/2017   No diagnostic ST segment changes to indicate ischemia.  Small, mild intensity, lateral apical region of ischemia in the setting of artifact from extracardiac radiotracer uptake and also arms down imaging. No large ischemic territories.  This is a low risk study.  Nuclear stress EF: 74%.     Echo Study Conclusions from 2011 Left ventricle: The cavity size was normal. Systolic function was normal. The estimated ejection fraction was in the range of 55% to 60%. Wall motion was normal; there were no regional wall motion abnormalities.    CARDIAC CATHETERIZATION 2007  HEMODYNAMICS:  1. Left main coronary artery: The left main coronary artery is free of  significant disease.  2. Left anterior descending artery: The left anterior descending artery  gave rise to two diagonal branches and a septal perforator. The LAD was  quite tortuous in its mid portion and then had  a 99% occlusion in its  distal portion with TIMI-I flow.  3. Circumflex artery: The circumflex artery gave rise to an atrial branch,  marginal branch, and two posterolateral branches. These vessels were  free of significant disease.  4. Right coronary artery: The right coronary artery is a moderate size  vessel which gave rise to a clonus branch, right ventricular branch,  posterior descending branch, and a posterolateral branch. This vessel  was free of significant disease.  LEFT VENTRICULOGRAM: The left ventriculogram was obtained in the RAO  projection showed akinesis of the tip of the apex in the inferoapical  segment. The overall wall motion was good with an estimated ejection  fraction of 60%.  Following attempt at PCA there was persistent 99% stenosis in the distal LAD  with TIMI-I flow.  CONCLUSION:  1. Coronary artery disease, status post recent non-ST-elevation myocardial  infarction with 99% stenosis in the distal left anterior descending with  TIMI-I flow, no significant obstruction in the circumflex and right  coronary arteries, and inferoapical wall akinesis.  2. Unsuccessful percutaneous coronary intervention of the lesion in the  distal left anterior descending due to inability to cross with the wire.  DISPOSITION: The patient returned to the postanesthesia care unit for  further observation. I think her outlook should be good on medical therapy.  She has no significant residual coronary disease and overall LV function is  good.  ______________________________  Charlies Constable, M.D. [facility]  BB/MEDQ D: 08/25/2005 T: 08/25/2005 Job: [id number]     ASSESSMENT & PLAN:   1. CAD - remote NSTEMI - unsuccessful PCI to distal LAD from 2007 - no residual disease at that time noted and has been managed medically since with chronic DAPT and chronic Ranexa. Her chest pain syndrome is stable. Her NTG use is stable. She works very hard with CV risk factor  modification.   2. HLD - on statin - needs labs today. Also in research study as well.   3. Prior stroke - with left sided weakness/paralysis - limited by chronic nerve pain - she still does an amazing job with trying to stay active.   4. Chronic nerve pain   Current medicines are reviewed with the patient today.  The patient does not have concerns regarding medicines other than what has been noted above.  The following changes have been made:  See above.  Labs/ tests ordered today include:    Orders Placed This Encounter  Procedures  . Basic metabolic panel  . CBC  . Hepatic function panel  . Lipid panel  . EKG 12-Lead     Disposition:   FU with Dr. Duke Salvia in 6 months as new patient. Labs today. No changes with her current regimen. She is felt to be doing well from our standpoint.     Patient is agreeable to this plan and will call if any problems develop in the interim.   SignedNorma Fredrickson, NP  04/23/2020 10:59 AM  [facility] [address] [place], [place]  [address] Phone: [phone] Fax: [phone]

## 2020-04-23 ENCOUNTER — Encounter: Payer: Medicare HMO | Admitting: *Deleted

## 2020-04-23 ENCOUNTER — Ambulatory Visit: Payer: Medicare HMO | Admitting: Nurse Practitioner

## 2020-04-23 ENCOUNTER — Ambulatory Visit
Admission: RE | Admit: 2020-04-23 | Discharge: 2020-04-23 | Disposition: A | Discharge status: Home / Self Care | Payer: Medicare HMO | Source: Ambulatory Visit | Attending: Unknown Physician Specialty | Admitting: Unknown Physician Specialty

## 2020-04-23 ENCOUNTER — Other Ambulatory Visit: Payer: Self-pay

## 2020-04-23 ENCOUNTER — Encounter: Payer: Self-pay | Admitting: Nurse Practitioner

## 2020-04-23 ENCOUNTER — Telehealth: Payer: Self-pay | Admitting: General Practice

## 2020-04-23 VITALS — BP 138/80 | HR 76 | Ht 62.0 in | Wt 140.2 lb

## 2020-04-23 DIAGNOSIS — I259 Chronic ischemic heart disease, unspecified: Secondary | ICD-10-CM

## 2020-04-23 DIAGNOSIS — Z79899 Other long term (current) drug therapy: Secondary | ICD-10-CM

## 2020-04-23 DIAGNOSIS — E7800 Pure hypercholesterolemia, unspecified: Secondary | ICD-10-CM | POA: Diagnosis not present

## 2020-04-23 DIAGNOSIS — Z006 Encounter for examination for normal comparison and control in clinical research program: Secondary | ICD-10-CM

## 2020-04-23 DIAGNOSIS — Z1231 Encounter for screening mammogram for malignant neoplasm of breast: Secondary | ICD-10-CM

## 2020-04-23 LAB — BASIC METABOLIC PANEL
BUN/Creatinine Ratio: 17 (ref 12–28)
BUN: 14 mg/dL (ref 8–27)
CO2: 28 mmol/L (ref 20–29)
Calcium: 9.2 mg/dL (ref 8.7–10.3)
Chloride: 100 mmol/L (ref 96–106)
Creatinine, Ser: 0.83 mg/dL (ref 0.57–1.00)
GFR calc Af Amer: 84 mL/min/{1.73_m2} (ref >59)
GFR calc non Af Amer: 73 mL/min/{1.73_m2} (ref >59)
Glucose: 94 mg/dL (ref 65–99)
Potassium: 4.4 mmol/L (ref 3.5–5.2)
Sodium: 138 mmol/L (ref 134–144)

## 2020-04-23 LAB — CBC
Hematocrit: 41.8 % (ref 34.0–46.6)
Hemoglobin: 14 g/dL (ref 11.1–15.9)
MCH: 33 pg (ref 26.6–33.0)
MCHC: 33.5 g/dL (ref 31.5–35.7)
MCV: 99 fL — ABNORMAL HIGH (ref 79–97)
Platelets: 278 10*3/uL (ref 150–450)
RBC: 4.24 x10E6/uL (ref 3.77–5.28)
RDW: 12.4 % (ref 11.7–15.4)
WBC: 6.1 10*3/uL (ref 3.4–10.8)

## 2020-04-23 LAB — LIPID PANEL
Chol/HDL Ratio: 2.5 ratio (ref 0.0–4.4)
Cholesterol, Total: 155 mg/dL (ref 100–199)
HDL: 61 mg/dL (ref >39)
LDL Chol Calc (NIH): 76 mg/dL (ref 0–99)
Triglycerides: 97 mg/dL (ref 0–149)
VLDL Cholesterol Cal: 18 mg/dL (ref 5–40)

## 2020-04-23 LAB — HEPATIC FUNCTION PANEL
ALT: 19 IU/L (ref 0–32)
AST: 21 IU/L (ref 0–40)
Albumin: 4.3 g/dL (ref 3.8–4.8)
Alkaline Phosphatase: 61 IU/L (ref 44–121)
Bilirubin Total: 0.4 mg/dL (ref 0.0–1.2)
Bilirubin, Direct: <0.1 mg/dL (ref 0.00–0.40)
Total Protein: 7 g/dL (ref 6.0–8.5)

## 2020-04-23 NOTE — Telephone Encounter (Signed)
The patient called in and stated she was supposed to have a 6 month f/u for constipation, however she's doing fine and would call if she starts having any GI problems.

## 2020-04-23 NOTE — Patient Instructions (Addendum)
After Visit Summary:  We will be checking the following labs today - BMET, CBC, HPF and lipids   Medication Instructions:    Continue with your current medicines.    If you need a refill on your cardiac medications before your next appointment, please call your pharmacy.     Testing/Procedures To Be Arranged:  N/A  Follow-Up:   See Dr. Chilton Si in 6 months (new patient visit)- she is at the [facility] office    At [facility], you and your health needs are our priority.  As part of our continuing mission to provide you with exceptional heart care, we have created designated Provider Care Teams.  These Care Teams include your primary Cardiologist (physician) and Advanced Practice Providers (APPs -  Physician Assistants and Nurse Practitioners) who all work together to provide you with the care you need, when you need it.  Special Instructions:  . Stay safe, wash your hands for at least 20 seconds and wear a mask when needed.  . It was good to talk with you today.    Call the [facility] office at [phone] if you have any questions, problems or concerns.

## 2020-04-23 NOTE — Research (Signed)
Pt to research clinic for visit 6 in the [place] 4 study. No AE/SAE or con medication changes to report. Pt received injection in right abd and tolerated well. Next appointment scheduled for October 08, 2020 at 10:00

## 2020-04-29 ENCOUNTER — Telehealth: Payer: Self-pay | Admitting: Neurology

## 2020-04-29 NOTE — Telephone Encounter (Signed)
Patient has a Dysport appointment on 9/28. Her PA with [employer] expired in May. I called [employer] [phone]) to initiate a new one. I spoke with Luv to see if PA will be required for J0586 and [id number]. She states no PA is required for [address], but PA is required for J0586. She gave me a number to call to initiate PA for J0586 [phone] Reference #[id number]. I called [phone] and spoke with Selena Batten to initiate PA for [phone]. She was able to approve [phone] for 1 year. PA #[id number] (04/29/20- 04/29/21). Patient is B/B because she has Medicare.

## 2020-04-30 ENCOUNTER — Encounter: Payer: Self-pay | Admitting: Neurology

## 2020-04-30 ENCOUNTER — Ambulatory Visit: Payer: Medicare HMO | Admitting: Neurology

## 2020-04-30 ENCOUNTER — Other Ambulatory Visit: Payer: Self-pay

## 2020-04-30 VITALS — BP 123/80 | HR 81 | Ht 62.0 in | Wt 140.0 lb

## 2020-04-30 DIAGNOSIS — I69354 Hemiplegia and hemiparesis following cerebral infarction affecting left non-dominant side: Secondary | ICD-10-CM | POA: Diagnosis not present

## 2020-04-30 MED ORDER — ABOBOTULINUMTOXINA 500 UNITS IM SOLR
1500.0000 [IU] | Freq: Once | INTRAMUSCULAR | Status: AC
  Administered 2020-04-30: 1500 [IU] via INTRAMUSCULAR

## 2020-04-30 NOTE — Progress Notes (Signed)
[facility]  CC:  Spastic hemiplegia  [name] is a 69  y.o. 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 Marya Fossa, received EMG guided Botox injection in 2010 for about 2 years, with good benefit, but has difficulty with insurance  She began to receive Botox injection through our clinic by Dr. Hosie Poisson since May 2015,150 units total to left upper extremity, which has been very helpful  She is return for continued EMG guided Botox injection, at baseline, she can ambulate with a left AFO, but without assistance,  Profound left upper extremity spasticity, achiness, chronic pain, persistent left wrist flexion, finger flexion,  She also has a history of coronary artery disease in 2007, taking Plavix, and Aggrenox, exercise regularly, has a personal trainer  UPDATE March 2nd 2016: She received 300 units in Jun 13 2014, to left upper extremity, which has been very helpful, she did not notice significant side effect. The injection help her open her left hand better, no significant side effect noticed.  UPDATE June 8th 2016: She received 300 units of Botox A in March second 2016, responded very well, she was able to relax her left hand better, she ambulate without assistance with spastic left hemiparesis, complains of left facial, left foot pain, wants the injection to emphasize on her left finger flexion wrist flexion today.  UPDATE Sep 28th 2016: She responded to previous Botox injection very well in June, she wants to emphasize on her left upper extremity at this time, complains forceful finger contraction, flexion, elbow flexion. Only mild left shoulder pain with passive  movement.  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 Patient is concerned about potential side effect of weakness.  Update May 21 2016: She responded very well to previous injection in July, only wants to receive injection for spastic left upper extremity, she had a pair of left wrist/finger splint,  Update September 09 2016: She is now using a left wrist splint, which has been helpful,  UPDATE Dec 10 2016: She responded well to previous injection no significant side effect noticed,   UPDATE Apr 14 2017: She wear left wrist pain, denies significant side effect, complains of left-sided neuropathic pain, despite Prozac 20 mg, Nucynta 50 mg, gabapentin 600 mg twice a day,  UPDATE Jul 14 2017: She responded very well to previous injection in September 2018,  UPDATE October 13 2017: She responded very well to previous Botox injection  UPDATE January 26 2018: She complains of left leg weakness was injection, wants to focus injection on left upper extremity,  UPDATE May 11 2018: She responded well to previous injection  Update August 11, 2018: She responded well to previous injection, no  significant side effect noted  UPDATE January 03 2019: She responded very well to previous injection  Update April 12, 2019: She responded well to previous injection  UPDATE Sep 20 2019: She suffered left wrist fracture in November 2020, required surgery, missed her previous appointment,  UPDATE Dec 20 2019: She did well with previous injection  UPDATE Sept 28 2021: She did well to previous injection   Social History   Socioeconomic History  . Marital status: Married    Spouse name: Not on file  . Number of children: Not on file  . Years of education: Not on file  . Highest education level: Not on file  Occupational History  . Occupation: disabled  Tobacco Use  . Smoking status: Former Games developer  . Smokeless tobacco: Never Used  . Tobacco comment: Quit in 2005  Vaping Use  . Vaping Use: Never used  Substance and Sexual Activity  . Alcohol use: Yes    Comment: social  . Drug use: No  . Sexual activity: Not Currently  Other Topics Concern  . Not on file  Social History Narrative   Married, no children   Right handed   12th grade   1 cup daily   Social Determinants of Health   Financial Resource Strain:   . Difficulty of Paying Living Expenses: Not on file  Food Insecurity:   . Worried About Programme researcher, broadcasting/film/video in the Last Year: Not on file  . Ran Out of Food in the Last Year: Not on file  Transportation Needs:   . Lack of Transportation (Medical): Not on file  . Lack of Transportation (Non-Medical): Not on file  Physical Activity:   . Days of Exercise per Week: Not on file  . Minutes of Exercise per Session: Not on file  Stress:   . Feeling of Stress : Not on file  Social Connections:   . Frequency of Communication with Friends and Family: Not on file  . Frequency of Social Gatherings with Friends and Family: Not on file  . Attends Religious Services: Not on file  . Active Member of Clubs or Organizations: Not on file  . Attends Banker Meetings: Not on file  . Marital Status: Not on file  Intimate Partner Violence:   . Fear of Current or Ex-Partner: Not on file  . Emotionally Abused: Not on file  . Physically Abused: Not  on file  . Sexually Abused: Not on file    Family History  Problem Relation Age of Onset  . Arrhythmia Father        PPM  . Colon cancer Neg Hx     Past Medical History:  Diagnosis Date  . AMI (acute myocardial infarction) (HCC) 2007   failed PCI to the distal LAD - managed medically  . Chronic pain   . Complication of anesthesia   . CVA (cerebral vascular accident) (HCC) 1999  . Degenerative disc disease, cervical   . Hypothyroidism   . PONV (postoperative nausea and vomiting)   . Unstable angina [facility])     Past Surgical History:  Procedure Laterality Date  . COLONOSCOPY WITH PROPOFOL N/A 05/24/2018   PROPOFOL;  Surgeon: West Bali, MD; normal TI, significant looping of the colon, external hemorrhoids.  . ENDOMETRIAL ABLATION  3/06  . left ankle surgery      Current Outpatient Medications  Medication Sig Dispense Refill  . AbobotulinumtoxinA (DYSPORT) 500 units SOLR injection Inject 1,500 Units into the muscle every 3 (  three) months.    . ALPRAZolam (XANAX) 0.5 MG tablet Take 0.5 mg by mouth at bedtime.     . AMITIZA 24 MCG capsule TAKE 1 CAPSULE TWICE DAILY WITH MEALS 180 capsule 3  . aspirin 81 MG tablet Take 81 mg by mouth daily.    Marland Kitchen CALCIUM PO Take 2 tablets by mouth 2 (two) times daily.    . cetirizine (ZYRTEC) 10 MG tablet Take 10 mg by mouth at bedtime.     . Cholecalciferol (VITAMIN D3) 1000 units CAPS Take 1,000 Units by mouth daily.    . clopidogrel (PLAVIX) 75 MG tablet Take 1 tablet (75 mg total) by mouth daily. 90 tablet 3  . DENTA 5000 PLUS 1.1 % CREA dental cream Place 1 application onto teeth at bedtime.   4  . diclofenac Sodium (VOLTAREN) 1 % GEL SMARTSIG:4 Gram(s) Topical 3 Times Daily PRN    . diphenhydrAMINE (BENADRYL) 12.5 MG/5ML liquid Take 12.5 mg by mouth 4 (four) times daily as needed for allergies.    . DULoxetine (CYMBALTA) 60 MG capsule TAKE ONE CAPSULE BY MOUTH EVERY DAY 90 capsule 3  . FLUoxetine (PROZAC) 20 MG capsule Take 20 mg by  mouth daily.      Marland Kitchen gabapentin (NEURONTIN) 600 MG tablet Take 1,200 mg by mouth at bedtime. Can take an extra 600mg  if needed    . guaiFENesin (MUCINEX) 600 MG 12 hr tablet Take 600 mg by mouth 2 (two) times daily as needed for cough or to loosen phlegm.    HYDROCORTISONE ACE, RECTAL, 30 MG SUPP PLACE ONE SUPPOSITORY RECTALLY TWICE DAILY 28 suppository 2  . ibandronate (BONIVA) 150 MG tablet Take 150 mg by mouth every 30 (thirty) days. Take in the morning with a full glass of water, on an empty stomach, and do not take anything else by mouth or lie down for the next 30 min.    Marland Kitchen levothyroxine (SYNTHROID, LEVOTHROID) 75 MCG tablet Take 75 mcg by mouth daily before breakfast.    . Menthol (ICY HOT) 5 % PTCH Apply 1 patch topically at bedtime.    . metroNIDAZOLE (METROGEL) 0.75 % vaginal gel Place vaginally at bedtime.    . nitroGLYCERIN (NITROSTAT) 0.4 MG SL tablet Place 0.4 mg under the tongue every 5 (five) minutes as needed for chest pain.    . NUCYNTA 50 MG tablet Take 50 mg by mouth 4 (four) times daily as needed for severe pain.  0  . NUCYNTA ER 50 MG 12 hr tablet Take 50 mg by mouth every 12 (twelve) hours.   0  . pantoprazole (PROTONIX) 40 MG tablet Take 40 mg by mouth daily as needed (for acid reflux).     . ranolazine (RANEXA) 500 MG 12 hr tablet Take 1 tablet (500 mg total) by mouth daily. 90 tablet 2  . rOPINIRole (REQUIP) 2 MG tablet Take 2 mg by mouth at bedtime.     . rosuvastatin (CRESTOR) 10 MG tablet TAKE 1 TABLET DAILY 90 tablet 3  . Study - ORION 4 - inclisiran 300 mg/1.53mL or placebo SQ injection (PI-Stuckey) Inject 300 mg into the skin every 6 (six) months.    . triamcinolone ointment (KENALOG) 0.1 % SMARTSIG:Sparingly Topical 2-3 Times Daily    . fluticasone (FLONASE) 50 MCG/ACT nasal spray Place 2 sprays into both nostrils daily as needed for allergies.      No current facility-administered medications for this visit.    Allergies as of 04/30/2020 - Review Complete  04/30/2020  Allergen Reaction Noted  . Morphine Other (See Comments) 01/01/2009  . Zolpidem tartrate Other (See Comments) 01/01/2009  . Penicillins Rash and Other (See Comments) 01/01/2009    Vitals: Ht 5\' 2"  (1.575 m)   Wt 140 lb (63.5 kg)   BMI 25.61 kg/m  Last Weight:  Wt Readings from Last 1 Encounters:  04/30/20 140 lb (63.5 kg)   Last Height:   Ht Readings from Last 1 Encounters:  04/30/20 5\' 2"  (1.575 m)   PHYSICAL EXAMINATOINS:    Motor: spastic left hemiparesis,    left lower extremity moderate spasticity, left hip flexion 4, knee flexion 4, extension 5, Profound left upper extremity spasticity, left shoulder adduction, internal rotation.left elbow flexion, pronation, with passive movement, mild fixed contraction of left elbow, maximum 175, left wrist forceful flexion, finger flexion,thumb in position, even with forceful movement, she could not achieve full extension of her left finger  Gait: Rising up from seated position by pushing on chair arm,spastic left hemi-circumferential gait.  Assessment and plan:   69 years old right-handed Caucasian female, status post stroke, with spastic left hemiparesis since 1999, responded very well to previous EMG guided Botox injection, return to clinic for repeat injection  Under electric stimulation, 500 units of Dysport A x 3  was injected into left upper extremity muscles (500 units dysport/2.5cc of NS)  Left pronator teres 0.5cc x2=1.0 cc Left flexor digitorum profundus  0.5ccx2=1.0 cc Left palmaris longus 0.5cc Left brachialis  2.5 cc Left flexor digitorum superficialis 0.5 cc  Left pectoralis major 0.5ccx2=1.0cc Left latissimus dorsi 0.5 cc x 2= 1.0 cc   79, M.D. Ph.D.  [facility] [address] [place], [address] [place] Phone: [phone] Fax:      [phone]

## 2020-04-30 NOTE — Progress Notes (Signed)
**  Dysport 500 units x 3 vials, NDC [address]-5, Lot [id number], Exp 11/30/2020, office supply.//mck,rn**

## 2020-05-01 ENCOUNTER — Ambulatory Visit: Payer: Medicare HMO

## 2020-06-07 ENCOUNTER — Other Ambulatory Visit: Payer: Self-pay

## 2020-06-07 ENCOUNTER — Emergency Department (HOSPITAL_COMMUNITY): Payer: Medicare HMO

## 2020-06-07 ENCOUNTER — Emergency Department (HOSPITAL_COMMUNITY)
Admission: EM | Admit: 2020-06-07 | Discharge: 2020-06-07 | Disposition: A | Discharge status: Home / Self Care | Payer: Medicare HMO | Attending: Emergency Medicine | Admitting: Emergency Medicine

## 2020-06-07 ENCOUNTER — Encounter (HOSPITAL_COMMUNITY): Payer: Self-pay

## 2020-06-07 DIAGNOSIS — I2510 Atherosclerotic heart disease of native coronary artery without angina pectoris: Secondary | ICD-10-CM | POA: Insufficient documentation

## 2020-06-07 DIAGNOSIS — Z7982 Long term (current) use of aspirin: Secondary | ICD-10-CM | POA: Diagnosis not present

## 2020-06-07 DIAGNOSIS — Z79899 Other long term (current) drug therapy: Secondary | ICD-10-CM | POA: Insufficient documentation

## 2020-06-07 DIAGNOSIS — W01198A Fall on same level from slipping, tripping and stumbling with subsequent striking against other object, initial encounter: Secondary | ICD-10-CM | POA: Diagnosis not present

## 2020-06-07 DIAGNOSIS — Y9301 Activity, walking, marching and hiking: Secondary | ICD-10-CM | POA: Insufficient documentation

## 2020-06-07 DIAGNOSIS — T148XXA Other injury of unspecified body region, initial encounter: Secondary | ICD-10-CM

## 2020-06-07 DIAGNOSIS — S0990XA Unspecified injury of head, initial encounter: Secondary | ICD-10-CM | POA: Diagnosis not present

## 2020-06-07 DIAGNOSIS — W19XXXA Unspecified fall, initial encounter: Secondary | ICD-10-CM

## 2020-06-07 DIAGNOSIS — Y92090 Kitchen in other non-institutional residence as the place of occurrence of the external cause: Secondary | ICD-10-CM | POA: Insufficient documentation

## 2020-06-07 DIAGNOSIS — S0001XA Abrasion of scalp, initial encounter: Secondary | ICD-10-CM | POA: Insufficient documentation

## 2020-06-07 DIAGNOSIS — E039 Hypothyroidism, unspecified: Secondary | ICD-10-CM | POA: Insufficient documentation

## 2020-06-07 DIAGNOSIS — Z87891 Personal history of nicotine dependence: Secondary | ICD-10-CM | POA: Diagnosis not present

## 2020-06-07 DIAGNOSIS — Y92009 Unspecified place in unspecified non-institutional (private) residence as the place of occurrence of the external cause: Secondary | ICD-10-CM

## 2020-06-07 NOTE — ED Provider Notes (Signed)
Medical screening examination/treatment/procedure(s) were conducted as a shared visit with non-physician practitioner(s) and myself.  I personally evaluated the patient during the encounter.      Patient seen by me along with physician assistant. Patient had a fall hitting the edge of a door frame. Patient has had a prior stroke. So has some instability. No loss of consciousness. CT head neck without any acute injuries. There is about a 1 cm laceration to the occipital part of the scalp. No active bleeding. This does not require stapling at this time. Patient is okay with leaving it alone. Patient stable for discharge.   Dawn Mulders, MD 06/07/20 1137

## 2020-06-07 NOTE — ED Triage Notes (Signed)
Pt presents to ED after fall this morning where she fell and hit the back of her head on the wall. Pt denies LOC. Pt is on Plavix.

## 2020-06-07 NOTE — Discharge Instructions (Addendum)
Follow-up with your primary care provider. Return to the ER if you start to experience worsening headache, blurry vision, numbness in arms or legs, injuries or falls.

## 2020-06-07 NOTE — ED Provider Notes (Signed)
[facility] EMERGENCY DEPARTMENT Provider Note   CSN: [id number] Arrival date & time: 06/07/20  7829     History Chief Complaint  Patient presents with  . Fall    [name] is a 69 y.o. 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  This morning when she was walking to the kitchen to take her dose of Synthroid felt like she was off balance.  States that ever since her stroke 22 years ago, she has trouble with ambulating and speaking at the same time.  This is not unusual for her.  She fell and struck the back of her head on the wall.  She denies any loss of consciousness.  She has been having a slight sinus headache for the past week but no other headache today.  No numbness in arms or legs, weakness that has worsened, vision changes, vomiting, loss of vision.  She does endorse some neck pain associated with the fall.  HPI     Past Medical History:  Diagnosis Date  . AMI (acute myocardial infarction) (HCC) 2007   failed PCI to the distal LAD - managed medically  . Chronic pain   . Complication of anesthesia   . CVA (cerebral vascular accident) (HCC) 1999  . Degenerative disc disease, cervical   . Hypothyroidism   . PONV (postoperative nausea and vomiting)   . Unstable angina [facility])     Patient Active Problem List   Diagnosis Date Noted  . Chronic constipation 05/29/2019  . 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 Change in bowel habits 05/16/2018  . Hemorrhoids 05/16/2018  . Rectal bleeding 11/17/2017  . Therapeutic opioid induced constipation 11/17/2017  . Fatty stool 11/17/2017  . Neuropathic pain 04/14/2017  . Spastic hemiplegia affecting left nondominant side (HCC) 06/13/2014  . Other and unspecified hyperlipidemia 09/10/2009  . CAD, NATIVE VESSEL 09/10/2009  .  HYPOTHYROIDISM 01/01/2009  . Cerebral artery occlusion with cerebral infarction (HCC) 01/01/2009    Past Surgical History:  Procedure Laterality Date  . COLONOSCOPY WITH PROPOFOL N/A 05/24/2018   PROPOFOL;  Surgeon: West Bali, MD; normal TI, significant looping of the colon, external hemorrhoids.  . ENDOMETRIAL ABLATION  3/06  . left ankle surgery       OB History   No obstetric history on file.     Family History  Problem Relation Age of Onset  . Arrhythmia Father        PPM  . Colon cancer Neg Hx     Social History   Tobacco Use  . Smoking status: Former Games developer  . Smokeless tobacco: Never Used  . Tobacco comment: Quit in 2005  Vaping Use  . Vaping Use: Never used  Substance Use Topics  . Alcohol use: Yes    Comment: social  . Drug use: No    Home Medications Prior to Admission medications   Medication Sig Start Date End Date Taking? Authorizing Provider  AbobotulinumtoxinA (DYSPORT) 500 units SOLR injection Inject 1,500 Units into the muscle every 3 (three) months.    [provider]  ALPRAZolam Prudy Feeler) 0.5 MG tablet Take 0.5 mg by mouth at bedtime.     [provider]  AMITIZA 24 MCG capsule TAKE 1 CAPSULE TWICE DAILY WITH MEALS 01/22/20   Letta Median, PA-C  aspirin 81 MG  tablet Take 81 mg by mouth daily.    [provider]  CALCIUM PO Take 2 tablets by mouth 2 (two) times daily.    [provider]  cetirizine (ZYRTEC) 10 MG tablet Take 10 mg by mouth at bedtime.     [provider]  Cholecalciferol (VITAMIN D3) 1000 units CAPS Take 1,000 Units by mouth daily.    [provider]  clopidogrel (PLAVIX) 75 MG tablet Take 1 tablet (75 mg total) by mouth daily. 12/26/10   Wall, Jesse Sans, MD  DENTA 5000 PLUS 1.1 % CREA dental cream Place 1 application onto teeth at bedtime.  06/12/16   [provider]  diclofenac Sodium (VOLTAREN) 1 % GEL SMARTSIG:4 Gram(s) Topical 3 Times Daily PRN 03/27/20    [provider]  diphenhydrAMINE (BENADRYL) 12.5 MG/5ML liquid Take 12.5 mg by mouth 4 (four) times daily as needed for allergies.    [provider]  DULoxetine (CYMBALTA) 60 MG capsule TAKE ONE CAPSULE BY MOUTH EVERY DAY 09/11/19   [name], MD  FLUoxetine (PROZAC) 20 MG capsule Take 20 mg by mouth daily.      [provider]  fluticasone (FLONASE) 50 MCG/ACT nasal spray Place 2 sprays into both nostrils daily as needed for allergies.  10/09/16 10/10/19  [provider]  gabapentin (NEURONTIN) 600 MG tablet Take 1,200 mg by mouth at bedtime. Can take an extra 600mg  if needed    [provider]  guaiFENesin (MUCINEX) 600 MG 12 hr tablet Take 600 mg by mouth 2 (two) times daily as needed for cough or to loosen phlegm.    [provider]  HYDROCORTISONE ACE, RECTAL, 30 MG SUPP PLACE ONE SUPPOSITORY RECTALLY TWICE DAILY 08/02/19   08/04/19, NP  ibandronate (BONIVA) 150 MG tablet Take 150 mg by mouth every 30 (thirty) days. Take in the morning with a full glass of water, on an empty stomach, and do not take anything else by mouth or lie down for the next 30 min.    [provider]  levothyroxine (SYNTHROID, LEVOTHROID) 75 MCG tablet Take 75 mcg by mouth daily before breakfast.    [provider]  Menthol (ICY HOT) 5 % PTCH Apply 1 patch topically at bedtime.    [provider]  metroNIDAZOLE (METROGEL) 0.75 % vaginal gel Place vaginally at bedtime. 04/19/20   [provider]  nitroGLYCERIN (NITROSTAT) 0.4 MG SL tablet Place 0.4 mg under the tongue every 5 (five) minutes as needed for chest pain.    [provider]  NUCYNTA 50 MG tablet Take 50 mg by mouth 4 (four) times daily as needed for severe pain. 05/10/18   [provider]  NUCYNTA ER 50 MG 12 hr tablet Take 50 mg by mouth every 12 (twelve) hours.  04/30/16   [provider]  pantoprazole (PROTONIX) 40 MG tablet Take 40 mg by mouth  daily as needed (for acid reflux).     [provider]  ranolazine (RANEXA) 500 MG 12 hr tablet Take 1 tablet (500 mg total) by mouth daily. 01/29/20   01/31/20, NP  rOPINIRole (REQUIP) 2 MG tablet Take 2 mg by mouth at bedtime.     [provider]  rosuvastatin (CRESTOR) 10 MG tablet TAKE 1 TABLET DAILY 01/23/20   01/25/20, NP  Study - ORION 4 - inclisiran 300 mg/1.74mL or placebo SQ injection (PI-Stuckey) Inject 300 mg into the skin every 6 (six) months. 07/22/18  [provider]  triamcinolone ointment (KENALOG) 0.1 % SMARTSIG:Sparingly Topical 2-3 Times Daily 04/19/20   [provider]    Allergies    Morphine, Zolpidem tartrate, and Penicillins  Review of Systems   Review of Systems  Constitutional: Negative for appetite change, chills and fever.  HENT: Positive for sinus pressure. Negative for ear pain, rhinorrhea, sneezing and sore throat.   Eyes: Negative for photophobia and visual disturbance.  Respiratory: Negative for cough, chest tightness, shortness of breath and wheezing.   Cardiovascular: Negative for chest pain and palpitations.  Gastrointestinal: Negative for abdominal pain, blood in stool, constipation, diarrhea, nausea and vomiting.  Genitourinary: Negative for dysuria, hematuria and urgency.  Musculoskeletal: Positive for neck pain. Negative for myalgias.  Skin: Negative for rash.  Neurological: Negative for dizziness, weakness and light-headedness.    Physical Exam Updated Vital Signs BP 124/77 (BP Location: Right Arm)   Pulse 82   Temp 97.9 F (36.6 C) (Oral)   Resp 18   Ht 5\' 2"  (1.575 m)   Wt 63 kg   SpO2 99%   BMI 25.42 kg/m   Physical Exam Vitals and nursing note reviewed.  Constitutional:      General: She is not in acute distress.    Appearance: She is well-developed.  HENT:     Head: Normocephalic and atraumatic.     Nose: Nose normal.  Eyes:     General: No scleral icterus.       Right eye:  No discharge.        Left eye: No discharge.     Conjunctiva/sclera: Conjunctivae normal.     Pupils: Pupils are equal, round, and reactive to light.  Cardiovascular:     Rate and Rhythm: Normal rate and regular rhythm.     Heart sounds: Normal heart sounds. No murmur heard.  No friction rub. No gallop.   Pulmonary:     Effort: Pulmonary effort is normal. No respiratory distress.     Breath sounds: Normal breath sounds.  Abdominal:     General: Bowel sounds are normal. There is no distension.     Palpations: Abdomen is soft.     Tenderness: There is no abdominal tenderness. There is no guarding.  Musculoskeletal:        General: Normal range of motion.     Cervical back: Normal range of motion and neck supple.     Comments: No midline spinal tenderness present in lumbar, thoracic or cervical spine. No step-off palpated. No visible bruising, edema or temperature change noted. No objective signs of numbness present. No saddle anesthesia. 2+ DP pulses bilaterally. Sensation intact to light touch. Strength 5/5 in bilateral lower extremities.  Skin:    General: Skin is warm and dry.     Findings: No rash.     Comments: Abrasion noted to posterior scalp.  Neurological:     General: No focal deficit present.     Mental Status: She is alert and oriented to person, place, and time.     Cranial Nerves: No cranial nerve deficit.     Sensory: No sensory deficit.     Motor: Weakness (baseline LUE) present. No abnormal muscle tone.     Coordination: Coordination normal.     Comments: Pupils reactive. No facial asymmetry noted. Cranial nerves appear grossly intact. Sensation intact to light touch on face, BUE and BLE. Strength 5/5 in RUE and BLE. Strength 0/5 in LUE, baseline.     ED Results / Procedures / Treatments  Labs (all labs ordered are listed, but only abnormal results are displayed) Labs Reviewed - No data to display  EKG None  Radiology CT Head Wo Contrast  Result Date:  06/07/2020 CLINICAL DATA:  Larey Seat while trying to go to the bathroom, no loss of consciousness, bleeding at back of head, neck pain, history of stroke, MI, former smoker EXAM: CT HEAD WITHOUT CONTRAST CT CERVICAL SPINE WITHOUT CONTRAST TECHNIQUE: Multidetector CT imaging of the head and cervical spine was performed following the standard protocol without intravenous contrast. Multiplanar CT image reconstructions of the cervical spine were also generated. COMPARISON:  None FINDINGS: CT HEAD FINDINGS Brain: Generalized atrophy. Ex vacuo dilatation of the RIGHT lateral ventricle due to large old RIGHT MCA territory infarct. This involves the RIGHT basal ganglia, insula, and anterior RIGHT temporal lobe as well. Mild small vessel chronic ischemic changes of deep cerebral white matter. No midline shift or mass effect. Small dystrophic calcification within the area of encephalomalacia involving the RIGHT frontal lobe. No intracranial hemorrhage, mass lesion, or evidence of acute infarction. No extra-axial fluid collections. Vascular: No hyperdense vessels Skull: Calvaria intact. Minimal scalp soft tissue swelling posterior RIGHT vertex. Sinuses/Orbits: Clear Other: N/A CT CERVICAL SPINE FINDINGS Alignment: Normal Skull base and vertebrae: Osseous demineralization. Disc space narrowing, endplate sclerosis and endplate spur formation at C4-C5, C5-C6, and C6-C7. Associated encroachment upon cervical neural foramina bilaterally, especially RIGHT C4-C5, RIGHT C6-C7, and LEFT C5-C6. Vertebral body heights maintained. Small benign appearing sclerotic focus within the T2 vertebral body. Visualized skull base intact. Soft tissues and spinal canal: Prevertebral soft tissues normal thickness. Disc levels:  No specific abnormality Upper chest: Lung apices clear. Other: N/A IMPRESSION: Atrophy with small vessel chronic ischemic changes of deep cerebral white matter. Large old RIGHT MCA territory infarct with ex vacuo dilatation of the  RIGHT lateral ventricle. No acute intracranial abnormalities. Multilevel degenerative disc disease changes of the cervical spine as above. No acute cervical spine abnormalities. Electronically Signed   By: Ulyses Southward M.D.   On: 06/07/2020 08:53   CT Cervical Spine Wo Contrast  Result Date: 06/07/2020 CLINICAL DATA:  Larey Seat while trying to go to the bathroom, no loss of consciousness, bleeding at back of head, neck pain, history of stroke, MI, former smoker EXAM: CT HEAD WITHOUT CONTRAST CT CERVICAL SPINE WITHOUT CONTRAST TECHNIQUE: Multidetector CT imaging of the head and cervical spine was performed following the standard protocol without intravenous contrast. Multiplanar CT image reconstructions of the cervical spine were also generated. COMPARISON:  None FINDINGS: CT HEAD FINDINGS Brain: Generalized atrophy. Ex vacuo dilatation of the RIGHT lateral ventricle due to large old RIGHT MCA territory infarct. This involves the RIGHT basal ganglia, insula, and anterior RIGHT temporal lobe as well. Mild small vessel chronic ischemic changes of deep cerebral white matter. No midline shift or mass effect. Small dystrophic calcification within the area of encephalomalacia involving the RIGHT frontal lobe. No intracranial hemorrhage, mass lesion, or evidence of acute infarction. No extra-axial fluid collections. Vascular: No hyperdense vessels Skull: Calvaria intact. Minimal scalp soft tissue swelling posterior RIGHT vertex. Sinuses/Orbits: Clear Other: N/A CT CERVICAL SPINE FINDINGS Alignment: Normal Skull base and vertebrae: Osseous demineralization. Disc space narrowing, endplate sclerosis and endplate spur formation at C4-C5, C5-C6, and C6-C7. Associated encroachment upon cervical neural foramina bilaterally, especially RIGHT C4-C5, RIGHT C6-C7, and LEFT C5-C6. Vertebral body heights maintained. Small benign appearing sclerotic focus within the T2 vertebral body. Visualized skull base intact. Soft tissues and spinal  canal: Prevertebral soft tissues normal  thickness. Disc levels:  No specific abnormality Upper chest: Lung apices clear. Other: N/A IMPRESSION: Atrophy with small vessel chronic ischemic changes of deep cerebral white matter. Large old RIGHT MCA territory infarct with ex vacuo dilatation of the RIGHT lateral ventricle. No acute intracranial abnormalities. Multilevel degenerative disc disease changes of the cervical spine as above. No acute cervical spine abnormalities. Electronically Signed   By: Ulyses SouthwardMark  Boles M.D.   On: 06/07/2020 08:53    Procedures Procedures (including critical care time)  Medications Ordered in ED Medications - No data to display  ED Course  I have reviewed the triage vital signs and the nursing notes.  Pertinent labs & imaging results that were available during my care of the patient were reviewed by me and considered in my medical decision making (see chart for details).    MDM Rules/Calculators/A&P                          69 year old female on Plavix presenting to the ED for fall.  States that ever since her stroke over 20 years ago she has had issues with ambulation and speaking at the same time.  She was on her way to take her morning Synthroid when she fell and struck the back of her head on a wall.  Denies any loss of consciousness.  Reports sinus headache and pressure for the past week but denies any other worsening or different headache.  No vision changes, numbness in arms or legs or changes to weakness.  On exam she has some weakness of the left upper extremity which is baseline for her since her stroke.  No other neurological deficits.  She is alert and oriented.  There is a small wound to the back of her head she states she will clean at home.  CT of the head and cervical spine without any acute abnormalities.  Patient was counseled on head injury precautions and symptoms that should indicate their return to the ED.  These include severe worsening headache, vision  changes, confusion, loss of consciousness, trouble walking, nausea & vomiting, or weakness/tingling in extremities. Patient discussed with and seen by the attending, Dr. Deretha EmoryZackowski.   Patient is hemodynamically stable, in NAD, and able to ambulate in the ED. Evaluation does not show pathology that would require ongoing emergent intervention or inpatient treatment. I explained the diagnosis to the patient. Pain has been managed and has no complaints prior to discharge. Patient is comfortable with above plan and is stable for discharge at this time. All questions were answered prior to disposition. Strict return precautions for returning to the ED were discussed. Encouraged follow up with PCP.   An After Visit Summary was printed and given to the patient.   Portions of this note were generated with Scientist, clinical (histocompatibility and immunogenetics)Dragon dictation software. Dictation errors may occur despite best attempts at proofreading.  Final Clinical Impression(s) / ED Diagnoses Final diagnoses:  Fall in home, initial encounter  Injury of head, initial encounter  Abrasion    Rx / DC Orders ED Discharge Orders    None       Dietrich PatesKhatri, Mavis Fichera, PA-C 06/07/20 1153    Vanetta MuldersZackowski, Scott, MD 06/08/20 [phone]

## 2020-07-08 ENCOUNTER — Other Ambulatory Visit: Payer: Self-pay

## 2020-07-08 DIAGNOSIS — I739 Peripheral vascular disease, unspecified: Secondary | ICD-10-CM

## 2020-07-16 ENCOUNTER — Ambulatory Visit: Payer: Medicare HMO | Admitting: Physician Assistant

## 2020-07-16 ENCOUNTER — Other Ambulatory Visit: Payer: Self-pay

## 2020-07-16 ENCOUNTER — Ambulatory Visit (HOSPITAL_COMMUNITY)
Admission: RE | Admit: 2020-07-16 | Discharge: 2020-07-16 | Disposition: A | Discharge status: Home / Self Care | Payer: Medicare HMO | Source: Ambulatory Visit | Attending: Physician Assistant | Admitting: Physician Assistant

## 2020-07-16 VITALS — BP 127/77 | HR 79 | Temp 98.7°F | Resp 20 | Ht 62.0 in | Wt 139.0 lb

## 2020-07-16 DIAGNOSIS — I739 Peripheral vascular disease, unspecified: Secondary | ICD-10-CM

## 2020-07-16 NOTE — Progress Notes (Addendum)
[facility] [place] HISTORY AND PHYSICAL   History of Present Illness:  Patient is a 69 y.o. year old female who presents for evaluation after abnormal health insurance screening for PAD.  The patient does not currently describe any cramping sensation on ambulation in either of her lower extremities. She says she will very occasionally, if she does too much, have cramping in her toes. She does not have rest pain.  There is no history of ulcerations on the feet or slow to heal wounds. She does not have any lower extremity swelling, numbness, tingling or coldness. She does have muscle aching in her legs that is worse at night but she feels this is secondary to taking her Statin medication. 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 She does not have any lower extremity symptoms during her exercise. 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 She is currently on Aspirin, Statin and Plavix per her Cardiologist.  Atherosclerotic risk factors and other medical problems include coronary artery disease with history of MI, CVA, hyperlipidemia, and she is a former smoker.   The pt is on a statin for cholesterol management.  The pt is on a daily aspirin.   Other AC: Plavix The pt is not on medication for hypertension.   The pt is not diabetic Tobacco hx: former, quit 2005  Past Medical History:  Diagnosis Date  . AMI (acute myocardial infarction) (HCC) 2007   failed PCI to the distal LAD - managed medically  . Chronic pain   . Complication of anesthesia   . CVA (cerebral vascular accident) (HCC) 1999  . Degenerative disc disease, cervical   . Hypothyroidism   . PONV (postoperative nausea and vomiting)   . Unstable angina [facility])     Past Surgical History:  Procedure Laterality Date  . COLONOSCOPY WITH PROPOFOL N/A  05/24/2018   PROPOFOL;  Surgeon: West Bali, MD; normal TI, significant looping of the colon, external hemorrhoids.  . ENDOMETRIAL ABLATION  3/06  . left ankle surgery      ROS:   General:  No weight loss, Fever, chills  HEENT: No recent headaches, no nasal bleeding, no visual changes, no sore throat  Neurologic: No dizziness, blackouts, seizures. No recent symptoms of stroke or mini- stroke. No recent episodes of slurred speech, or temporary blindness.  Cardiac: No recent episodes of chest pain/pressure, no shortness of breath at rest.  No shortness of breath with exertion.  Denies history of atrial fibrillation or irregular heartbeat  Vascular: No history of rest pain in feet.  No history of claudication.  No history of non-healing ulcer, No history of DVT   Pulmonary: No home oxygen, no productive cough, no hemoptysis,  No asthma or wheezing  Musculoskeletal:  [ ]  Arthritis, [ ]  Low back pain,  [ ]  Joint pain  Hematologic:No history of hypercoagulable state.  No history of easy bleeding.  No history of anemia  Gastrointestinal: No hematochezia or melena,  No gastroesophageal reflux, no trouble swallowing  Urinary: [ ]  chronic Kidney disease, [ ]  on HD - [ ]  MWF or [ ]  TTHS, [ ]  Burning with urination, [ ]  Frequent urination, [ ]  Difficulty urinating;   Skin: No rashes  Psychological: No history of anxiety,  No history of depression  Social History Social History  Tobacco Use  . Smoking status: Former Games developer  . Smokeless tobacco: Never Used  . Tobacco comment: Quit in 2005  Vaping Use  . Vaping Use: Never used  Substance Use Topics  . Alcohol use: Yes    Comment: social  . Drug use: No    Family History Family History  Problem Relation Age of Onset  . Arrhythmia Father        PPM  . Colon cancer Neg Hx     Allergies  Allergies  Allergen Reactions  . Morphine Other (See Comments)    Sick on stomach  . Zolpidem Tartrate Other (See Comments)    Sleep  walk  . Penicillins Rash and Other (See Comments)    Has patient had a PCN reaction causing immediate rash, facial/tongue/throat swelling, SOB or lightheadedness with hypotension: Unknown Has patient had a PCN reaction causing severe rash involving mucus membranes or skin necrosis: No Has patient had a PCN reaction that required hospitalization: No Has patient had a PCN reaction occurring within the last 10 years: No If all of the above answers are "NO", then may proceed with Cephalosporin use.      Current Outpatient Medications  Medication Sig Dispense Refill  . AbobotulinumtoxinA (DYSPORT) 500 units SOLR injection Inject 1,500 Units into the muscle every 3 (three) months.    . ALPRAZolam (XANAX) 0.5 MG tablet Take 0.5 mg by mouth at bedtime.    . AMITIZA 24 MCG capsule TAKE 1 CAPSULE TWICE DAILY WITH MEALS 180 capsule 3  . aspirin 81 MG tablet Take 81 mg by mouth daily.    Marland Kitchen CALCIUM PO Take 2 tablets by mouth 2 (two) times daily.    . cetirizine (ZYRTEC) 10 MG tablet Take 10 mg by mouth at bedtime.    . Cholecalciferol (VITAMIN D3) 1000 units CAPS Take 1,000 Units by mouth daily.    . clopidogrel (PLAVIX) 75 MG tablet Take 1 tablet (75 mg total) by mouth daily. 90 tablet 3  . DENTA 5000 PLUS 1.1 % CREA dental cream Place 1 application onto teeth at bedtime.   4  . diclofenac Sodium (VOLTAREN) 1 % GEL SMARTSIG:4 Gram(s) Topical 3 Times Daily PRN    . diphenhydrAMINE (BENADRYL) 12.5 MG/5ML liquid Take 12.5 mg by mouth 4 (four) times daily as needed for allergies.    . DULoxetine (CYMBALTA) 60 MG capsule TAKE ONE CAPSULE BY MOUTH EVERY DAY 90 capsule 3  . FLUoxetine (PROZAC) 20 MG capsule Take 20 mg by mouth daily.    Marland Kitchen gabapentin (NEURONTIN) 600 MG tablet Take 1,200 mg by mouth at bedtime. Can take an extra 600mg  if needed    . guaiFENesin (MUCINEX) 600 MG 12 hr tablet Take 600 mg by mouth 2 (two) times daily as needed for cough or to loosen phlegm.    HYDROCORTISONE ACE, RECTAL, 30 MG  SUPP PLACE ONE SUPPOSITORY RECTALLY TWICE DAILY 28 suppository 2  . ibandronate (BONIVA) 150 MG tablet Take 150 mg by mouth every 30 (thirty) days. Take in the morning with a full glass of water, on an empty stomach, and do not take anything else by mouth or lie down for the next 30 min.    Marland Kitchen levothyroxine (SYNTHROID, LEVOTHROID) 75 MCG tablet Take 75 mcg by mouth daily before breakfast.    . Menthol 5 % PTCH Apply 1 patch topically at bedtime.    . nitroGLYCERIN (NITROSTAT) 0.4 MG SL tablet Place 0.4 mg under the tongue every 5 (five) minutes as  needed for chest pain.    . NUCYNTA 50 MG tablet Take 50 mg by mouth 4 (four) times daily as needed for severe pain.  0  . NUCYNTA ER 50 MG 12 hr tablet Take 50 mg by mouth every 12 (twelve) hours.   0  . pantoprazole (PROTONIX) 40 MG tablet Take 40 mg by mouth daily as needed (for acid reflux).    Marland Kitchen PROCTO-MED HC 2.5 % rectal cream Apply topically 2 (two) times daily.    . ranolazine (RANEXA) 500 MG 12 hr tablet Take 1 tablet (500 mg total) by mouth daily. 90 tablet 2  . rOPINIRole (REQUIP) 2 MG tablet Take 2 mg by mouth at bedtime.     . rosuvastatin (CRESTOR) 10 MG tablet TAKE 1 TABLET DAILY 90 tablet 3  . Study - ORION 4 - inclisiran 300 mg/1.53mL or placebo SQ injection (PI-Stuckey) Inject 300 mg into the skin every 6 (six) months.    . triamcinolone ointment (KENALOG) 0.1 % SMARTSIG:Sparingly Topical 2-3 Times Daily    . fluticasone (FLONASE) 50 MCG/ACT nasal spray Place 2 sprays into both nostrils daily as needed for allergies.      No current facility-administered medications for this visit.    Physical Examination  Vitals:   07/16/20 1030  BP: 127/77  Pulse: 79  Resp: 20  Temp: 98.7 F (37.1 C)  TempSrc: Temporal  SpO2: 97%  Weight: 139 lb (63 kg)  Height: 5\' 2"  (1.575 m)    Body mass index is 25.42 kg/m.  General:  WDWL, pleasant female, in no acute distress HEENT: Normal Neck: No bruit or JVD Pulmonary: Clear to auscultation  bilaterally Cardiac: Regular Rate and Rhythm without murmur Abdomen: Soft, non-tender, non-distended Skin: No rash Extremity Pulses:  2+ radial, brachial, femoral, dorsalis pedis pulses bilaterally. Posterior tibial pulses were 1+ bilaterally. Feet warm. Motor and sensation intact on right. She has left foot drop Musculoskeletal: No deformity or edema  Neurologic: Left Upper and lower extremity hemiparesis. Right upper and lower extremity motor intact. Alert and oriented   Non-Invasive Vascular Imaging:   +-------+-----------+-----------+------------+------------+  ABI/TBIToday's ABIToday's TBIPrevious ABIPrevious TBI  +-------+-----------+-----------+------------+------------+  Right 1.15    0.70                  +-------+-----------+-----------+------------+------------+  Left  0.99    1.44                  +-------+-----------+-----------+------------+------------+  Triphasic waveforms bilaterally Right great toe pressure = 90 mmHg Left great toe pressure = 184 mm Hg  Assessment and Plan: Mrs. Kysar is a pleasant 69 year old female who presents for evaluation following an abnormal at home screening for PAD. She has multiple risk factors for peripheral artery disease including hyperlipidemia, pervious stroke, coronary artery disease with history of MI and former smoker. Her ABI/ TBI today is essentially normal. She likely has mild runoff disease. She is asymptomatic in regard to her arterial disease. She does continue to follow up with Cardiology. She does not have any recollection of prior carotid artery duplex evaluation. I discussed doing a screening with her history of CVA. Also with her risk factors I will have her follow up in 6 months with repeat ABIs. She will continue her Aspirin, statin and Plavix. I did recommend she follow up with her cardiologist regarding changing her statin due to myalgias. I congratulated her and  encouraged her to continue her exercise program. She will call for earlier follow up if she  has new or concerning symptoms    Corrie Avice Funchess, PA-C [facility] [place] Office: [phone]  Clinic MD: Dr. Lenell AntuHawken

## 2020-07-19 ENCOUNTER — Other Ambulatory Visit: Payer: Self-pay

## 2020-07-19 DIAGNOSIS — I739 Peripheral vascular disease, unspecified: Secondary | ICD-10-CM

## 2020-07-20 ENCOUNTER — Emergency Department (HOSPITAL_COMMUNITY): Payer: Medicare HMO

## 2020-07-20 ENCOUNTER — Other Ambulatory Visit: Payer: Self-pay

## 2020-07-20 ENCOUNTER — Emergency Department (HOSPITAL_COMMUNITY)
Admission: EM | Admit: 2020-07-20 | Discharge: 2020-07-20 | Disposition: A | Discharge status: Home / Self Care | Payer: Medicare HMO | Attending: Emergency Medicine | Admitting: Emergency Medicine

## 2020-07-20 ENCOUNTER — Encounter (HOSPITAL_COMMUNITY): Payer: Self-pay | Admitting: Emergency Medicine

## 2020-07-20 DIAGNOSIS — E039 Hypothyroidism, unspecified: Secondary | ICD-10-CM | POA: Diagnosis not present

## 2020-07-20 DIAGNOSIS — S52602A Unspecified fracture of lower end of left ulna, initial encounter for closed fracture: Secondary | ICD-10-CM | POA: Insufficient documentation

## 2020-07-20 DIAGNOSIS — Z87891 Personal history of nicotine dependence: Secondary | ICD-10-CM | POA: Diagnosis not present

## 2020-07-20 DIAGNOSIS — Z7901 Long term (current) use of anticoagulants: Secondary | ICD-10-CM | POA: Diagnosis not present

## 2020-07-20 DIAGNOSIS — M79632 Pain in left forearm: Secondary | ICD-10-CM | POA: Insufficient documentation

## 2020-07-20 DIAGNOSIS — S6992XA Unspecified injury of left wrist, hand and finger(s), initial encounter: Secondary | ICD-10-CM | POA: Diagnosis present

## 2020-07-20 DIAGNOSIS — W1839XA Other fall on same level, initial encounter: Secondary | ICD-10-CM | POA: Diagnosis not present

## 2020-07-20 DIAGNOSIS — M25532 Pain in left wrist: Secondary | ICD-10-CM | POA: Diagnosis not present

## 2020-07-20 MED ORDER — ACETAMINOPHEN 500 MG PO TABS
1000.0000 mg | ORAL_TABLET | Freq: Once | ORAL | Status: AC
  Administered 2020-07-20: 1000 mg via ORAL
  Filled 2020-07-20: qty 2

## 2020-07-20 NOTE — ED Provider Notes (Signed)
[facility]-EMERGENCY DEPT [facility] Emergency Department Provider Note MRN:  [id number]  Arrival date & time: 07/20/20     Chief Complaint   Fall   History of Present Illness   [name] is a 69 y.o. year-old female with a history of CAD, stroke presenting to the ED with chief complaint of fall.  Patient tripped, lost her balance, fell onto outstretched hand.  She is endorsing pain to the left wrist, left forearm.  Denies head trauma or loss of consciousness.  Does take Plavix.  No headache, no nausea vomiting, no neck pain, no back pain, no chest pain or shortness of breath, no abdominal pain, no other injuries.  Pain is currently moderate, constant, worse with motion or palpation.  Review of Systems  A complete 10 system review of systems was obtained and all systems are negative except as noted in the HPI and PMH.   Patient's Health History    Past Medical History:  Diagnosis Date  . AMI (acute myocardial infarction) (HCC) 2007   failed PCI to the distal LAD - managed medically  . Chronic pain   . Complication of anesthesia   . CVA (cerebral vascular accident) (HCC) 1999  . Degenerative disc disease, cervical   . Hypothyroidism   . PONV (postoperative nausea and vomiting)   . Unstable angina [facility])     Past Surgical History:  Procedure Laterality Date  . COLONOSCOPY WITH PROPOFOL N/A 05/24/2018   PROPOFOL;  Surgeon: West Bali, MD; normal TI, significant looping of the colon, external hemorrhoids.  . ENDOMETRIAL ABLATION  3/06  . left ankle surgery      Family History  Problem Relation Age of Onset  . Arrhythmia Father        PPM  . Colon cancer Neg Hx     Social History   Socioeconomic History  . Marital status: Married    Spouse name: Not on file  . Number of children: Not on file  . Years of education: Not on file  . Highest education level: Not on file  Occupational History  . Occupation: disabled  Tobacco Use  . Smoking status: Former Games developer   . Smokeless tobacco: Never Used  . Tobacco comment: Quit in 2005  Vaping Use  . Vaping Use: Never used  Substance and Sexual Activity  . Alcohol use: Yes    Comment: social  . Drug use: No  . Sexual activity: Not Currently  Other Topics Concern  . Not on file  Social History Narrative   Married, no children   Right handed   12th grade   1 cup daily   Social Determinants of Health   Financial Resource Strain: Not on file  Food Insecurity: Not on file  Transportation Needs: Not on file  Physical Activity: Not on file  Stress: Not on file  Social Connections: Not on file  Intimate Partner Violence: Not on file     Physical Exam   Vitals:   07/20/20 0047  BP: 131/74  Pulse: 75  Resp: 18  Temp: 98.3 F (36.8 C)  SpO2: 100%    CONSTITUTIONAL: Well-appearing, NAD NEURO:  Alert and oriented x 3, decreased strength to the left arm (chronic) EYES:  eyes equal and reactive ENT/NECK:  no LAD, no JVD CARDIO: Regular rate, well-perfused, normal S1 and S2 PULM:  CTAB no wheezing or rhonchi GI/GU:  normal bowel sounds, non-distended, non-tender MSK/SPINE:  No gross deformities, no edema, tenderness palpation to the left wrist and  forearm SKIN:  no rash, atraumatic PSYCH:  Appropriate speech and behavior  *Additional and/or pertinent findings included in MDM below  Diagnostic and Interventional Summary    EKG Interpretation  Date/Time:    Ventricular Rate:    PR Interval:    QRS Duration:   QT Interval:    QTC Calculation:   R Axis:     Text Interpretation:        Labs Reviewed - No data to display  DG Wrist Complete Left  Final Result    DG Forearm Left  Final Result    DG Elbow Complete Left  Final Result      Medications  acetaminophen (TYLENOL) tablet 1,000 mg (1,000 mg Oral Given 07/20/20 0145)     Procedures  /  Critical Care Procedures  ED Course and Medical Decision Making  I have reviewed the triage vital signs, the nursing notes, and  pertinent available records from the EMR.  Listed above are laboratory and imaging tests that I personally ordered, reviewed, and interpreted and then considered in my medical decision making (see below for details).  FOOSH, x-rays pending.  Mechanical fall, patient is anticoagulated but adamantly denies head trauma.     X-ray reveals new distal ulna fracture and there appears to be a dorsal angulation or bending of the radius hardware. Unable to see prior x-rays in the EMR but patient's husband has some x-rays on his phone and the hardware appears to have a volar tilt previously but now there is a clear distal bend. These findings discussed with Dr. Merlyn Lot, hand surgery on-call. The hand is vascularly intact, she has baseline weakness and numbness from prior stroke but this is unchanged. Strong normal cap refill. Appropriate for splint and close follow-up per Dr. Merlyn Lot. Appropriate for discharge.  Elmer Sow. Pilar Plate, MD [facility] Emergency Medicine [facility] [email or web address]  Final Clinical Impressions(s) / ED Diagnoses     ICD-10-CM   1. Closed fracture of distal end of left ulna, unspecified fracture morphology, initial encounter  S52.602A     ED Discharge Orders    None       Discharge Instructions Discussed with and Provided to Patient:     Discharge Instructions     You were evaluated in the Emergency Department and after careful evaluation, we did not find any emergent condition requiring admission or further testing in the hospital.  Your x-ray today showed a break in the ulnar bone of the wrist. Your hardware also seems to be bent. We recommend calling the office of Dr. Merlyn Lot first thing Monday morning to set up a follow-up appointment.  Please return to the Emergency Department if you experience any worsening of your condition.  Thank you for allowing [place] to be a part of your care.        Sabas Sous, MD 07/20/20 [phone]

## 2020-07-20 NOTE — Discharge Instructions (Addendum)
You were evaluated in the Emergency Department and after careful evaluation, we did not find any emergent condition requiring admission or further testing in the hospital.  Your x-ray today showed a break in the ulnar bone of the wrist. Your hardware also seems to be bent. We recommend calling the office of Dr. Merlyn Lot first thing Monday morning to set up a follow-up appointment.  Please return to the Emergency Department if you experience any worsening of your condition.  Thank you for allowing [place] to be a part of your care.

## 2020-07-22 ENCOUNTER — Telehealth: Payer: Self-pay | Admitting: *Deleted

## 2020-07-22 ENCOUNTER — Encounter (HOSPITAL_COMMUNITY): Payer: Self-pay | Admitting: Orthopedic Surgery

## 2020-07-22 ENCOUNTER — Other Ambulatory Visit: Payer: Self-pay | Admitting: Orthopedic Surgery

## 2020-07-22 ENCOUNTER — Other Ambulatory Visit (HOSPITAL_COMMUNITY)
Admission: RE | Admit: 2020-07-22 | Discharge: 2020-07-22 | Disposition: A | Discharge status: Home / Self Care | Payer: Medicare HMO | Source: Ambulatory Visit | Attending: Orthopedic Surgery | Admitting: Orthopedic Surgery

## 2020-07-22 DIAGNOSIS — Z20822 Contact with and (suspected) exposure to covid-19: Secondary | ICD-10-CM | POA: Diagnosis not present

## 2020-07-22 DIAGNOSIS — Z01812 Encounter for preprocedural laboratory examination: Secondary | ICD-10-CM | POA: Diagnosis present

## 2020-07-22 NOTE — Telephone Encounter (Signed)
   [facility] Pre-operative Risk Assessment    HEARTCARE STAFF: - Please ensure there is not already an duplicate clearance open for this procedure. - Under Visit Info/Reason for Call, type in Other and utilize the format Clearance MM/DD/YY or Clearance TBD. Do not use dashes or single digits. - If request is for dental extraction, please clarify the # of teeth to be extracted.  Request for surgical clearance: URGENT  1. What type of surgery is being performed? REMOVAL OF HARDWARE LEFT DISTAL RADIUS, ORIF LEFT DISTAL RADIUS POSSIBLE CRPP vs ORIF LEFT ULNA   2. When is this surgery scheduled? 07/23/20 URGENT   3. What type of clearance is required (medical clearance vs. Pharmacy clearance to hold med vs. Both)? MEDICAL  4. Are there any medications that need to be held prior to surgery and how long? PT IS ON ASA AND PLAVIX   5. Practice name and name of physician performing surgery? [facility]; DR. Lennette Bihari KUZMA   6. What is the office phone number? [phone]   7.   What is the office fax number? [place].   Anesthesia type (None, local, MAC, general) ? CHOICE   Julaine Hua 07/22/2020, 1:36 PM  _________________________________________________________________   (provider comments below)

## 2020-07-22 NOTE — Progress Notes (Addendum)
PCP:  Dr. Selinda Flavin Cardiologist:  Dr. Doristine Devoid  EKG:  04/23/20 CXR:  N/A ECHO: Stress Test: Cardiac Cath:  2007  Fasting Blood Sugar-  N/A Checks Blood Sugar_Nn/A__ times a day  OSA/CPAP:  No ASA:  No Blood Thinners:  Last dose per MD is 07/22/20  Covid test 07/22/20 pending results  Anesthesia Review:  No, cardiac clearance on chart from 07/22/20  Patient denies shortness of breath, fever, cough, and chest pain at PAT appointment.  Patient verbalized understanding of instructions provided today at the PAT appointment.  Patient asked to review instructions at home and day of surgery.

## 2020-07-22 NOTE — Telephone Encounter (Signed)
Clearance has been faxed to Dr. Betha Loa today at 2:25 pm 07/22/20 via Epic

## 2020-07-22 NOTE — Telephone Encounter (Signed)
Ok to forward to Dr. Merlyn Lot,  Patient seen back in September - doing well from our standpoint with no cardiac issues.   She is on aspirin and Plavix - this may be held as needed but see that her surgery is tomorrow.   Rosalio Macadamia, RN, ANP-C [facility] [address] [place], [place]  [address] [phone]

## 2020-07-23 ENCOUNTER — Encounter (HOSPITAL_COMMUNITY): Payer: Self-pay | Admitting: Orthopedic Surgery

## 2020-07-23 ENCOUNTER — Encounter (HOSPITAL_COMMUNITY)
Admission: RE | Disposition: A | Discharge status: Home / Self Care | Payer: Self-pay | Source: Home / Self Care | Attending: Orthopedic Surgery

## 2020-07-23 ENCOUNTER — Other Ambulatory Visit: Payer: Self-pay

## 2020-07-23 ENCOUNTER — Inpatient Hospital Stay (HOSPITAL_COMMUNITY): Payer: Medicare HMO | Admitting: Anesthesiology

## 2020-07-23 ENCOUNTER — Ambulatory Visit (HOSPITAL_COMMUNITY)
Admission: RE | Admit: 2020-07-23 | Discharge: 2020-07-23 | Disposition: A | Discharge status: Home / Self Care | Payer: Medicare HMO | Attending: Orthopedic Surgery | Admitting: Orthopedic Surgery

## 2020-07-23 ENCOUNTER — Inpatient Hospital Stay (HOSPITAL_COMMUNITY): Payer: Medicare HMO

## 2020-07-23 ENCOUNTER — Telehealth: Payer: Self-pay | Admitting: Neurology

## 2020-07-23 DIAGNOSIS — S52602A Unspecified fracture of lower end of left ulna, initial encounter for closed fracture: Secondary | ICD-10-CM | POA: Insufficient documentation

## 2020-07-23 DIAGNOSIS — Z79890 Hormone replacement therapy (postmenopausal): Secondary | ICD-10-CM | POA: Diagnosis not present

## 2020-07-23 DIAGNOSIS — Z7983 Long term (current) use of bisphosphonates: Secondary | ICD-10-CM | POA: Insufficient documentation

## 2020-07-23 DIAGNOSIS — Z79899 Other long term (current) drug therapy: Secondary | ICD-10-CM | POA: Diagnosis not present

## 2020-07-23 DIAGNOSIS — S52502A Unspecified fracture of the lower end of left radius, initial encounter for closed fracture: Secondary | ICD-10-CM | POA: Insufficient documentation

## 2020-07-23 DIAGNOSIS — Z880 Allergy status to penicillin: Secondary | ICD-10-CM | POA: Insufficient documentation

## 2020-07-23 DIAGNOSIS — W19XXXA Unspecified fall, initial encounter: Secondary | ICD-10-CM | POA: Diagnosis not present

## 2020-07-23 DIAGNOSIS — Z87891 Personal history of nicotine dependence: Secondary | ICD-10-CM | POA: Diagnosis not present

## 2020-07-23 DIAGNOSIS — Z888 Allergy status to other drugs, medicaments and biological substances status: Secondary | ICD-10-CM | POA: Insufficient documentation

## 2020-07-23 DIAGNOSIS — Z969 Presence of functional implant, unspecified: Secondary | ICD-10-CM | POA: Insufficient documentation

## 2020-07-23 DIAGNOSIS — Z7982 Long term (current) use of aspirin: Secondary | ICD-10-CM | POA: Diagnosis not present

## 2020-07-23 DIAGNOSIS — Z967 Presence of other bone and tendon implants: Secondary | ICD-10-CM | POA: Diagnosis not present

## 2020-07-23 DIAGNOSIS — Z885 Allergy status to narcotic agent status: Secondary | ICD-10-CM | POA: Diagnosis not present

## 2020-07-23 DIAGNOSIS — S52322A Displaced transverse fracture of shaft of left radius, initial encounter for closed fracture: Secondary | ICD-10-CM

## 2020-07-23 DIAGNOSIS — I252 Old myocardial infarction: Secondary | ICD-10-CM | POA: Diagnosis not present

## 2020-07-23 HISTORY — PX: OPEN REDUCTION INTERNAL FIXATION (ORIF) DISTAL RADIAL FRACTURE: SHX5989

## 2020-07-23 LAB — BASIC METABOLIC PANEL WITH GFR
Anion gap: 9 (ref 5–15)
BUN: 11 mg/dL (ref 8–23)
CO2: 25 mmol/L (ref 22–32)
Calcium: 8.9 mg/dL (ref 8.9–10.3)
Chloride: 102 mmol/L (ref 98–111)
Creatinine, Ser: 0.75 mg/dL (ref 0.44–1.00)
GFR, Estimated: >60 mL/min (ref >60)
Glucose, Bld: 82 mg/dL (ref 70–99)
Potassium: 5.5 mmol/L — ABNORMAL HIGH (ref 3.5–5.1)
Sodium: 136 mmol/L (ref 135–145)

## 2020-07-23 LAB — SARS CORONAVIRUS 2 (TAT 6-24 HRS): SARS Coronavirus 2: NEGATIVE

## 2020-07-23 SURGERY — OPEN REDUCTION INTERNAL FIXATION (ORIF) DISTAL RADIUS FRACTURE
Anesthesia: General | Site: Wrist | Laterality: Left

## 2020-07-23 MED ORDER — FENTANYL CITRATE (PF) 100 MCG/2ML IJ SOLN
25.0000 ug | INTRAMUSCULAR | Status: DC | PRN
  Administered 2020-07-23 (×2): 50 ug via INTRAVENOUS

## 2020-07-23 MED ORDER — CHLORHEXIDINE GLUCONATE 0.12 % MT SOLN
15.0000 mL | Freq: Once | OROMUCOSAL | Status: AC
  Administered 2020-07-23: 15 mL via OROMUCOSAL
  Filled 2020-07-23: qty 15

## 2020-07-23 MED ORDER — PROPOFOL 10 MG/ML IV BOLUS
INTRAVENOUS | Status: AC
  Filled 2020-07-23: qty 20

## 2020-07-23 MED ORDER — CLINDAMYCIN PHOSPHATE 900 MG/50ML IV SOLN
INTRAVENOUS | Status: AC
  Filled 2020-07-23: qty 50

## 2020-07-23 MED ORDER — FENTANYL CITRATE (PF) 100 MCG/2ML IJ SOLN
INTRAMUSCULAR | Status: AC
  Filled 2020-07-23: qty 2

## 2020-07-23 MED ORDER — ORAL CARE MOUTH RINSE: 15.0000 mL | Freq: Once | OROMUCOSAL | Status: AC

## 2020-07-23 MED ORDER — ONDANSETRON HCL 4 MG/2ML IJ SOLN: 4.0000 mg | Freq: Once | INTRAMUSCULAR | Status: DC | PRN

## 2020-07-23 MED ORDER — BUPIVACAINE HCL (PF) 0.25 % IJ SOLN
INTRAMUSCULAR | Status: DC | PRN
  Administered 2020-07-23: 10 mL

## 2020-07-23 MED ORDER — MIDAZOLAM HCL 2 MG/2ML IJ SOLN
INTRAMUSCULAR | Status: AC
  Filled 2020-07-23: qty 2

## 2020-07-23 MED ORDER — 0.9 % SODIUM CHLORIDE (POUR BTL) OPTIME
TOPICAL | Status: DC | PRN
  Administered 2020-07-23: 1000 mL

## 2020-07-23 MED ORDER — PROPOFOL 10 MG/ML IV BOLUS
INTRAVENOUS | Status: DC | PRN
  Administered 2020-07-23: 150 mg via INTRAVENOUS

## 2020-07-23 MED ORDER — FENTANYL CITRATE (PF) 250 MCG/5ML IJ SOLN
INTRAMUSCULAR | Status: AC
  Filled 2020-07-23: qty 5

## 2020-07-23 MED ORDER — CLINDAMYCIN PHOSPHATE 900 MG/50ML IV SOLN
900.0000 mg | INTRAVENOUS | Status: AC
  Administered 2020-07-23: 900 mg via INTRAVENOUS

## 2020-07-23 MED ORDER — DEXAMETHASONE SODIUM PHOSPHATE 4 MG/ML IJ SOLN
INTRAMUSCULAR | Status: DC | PRN
  Administered 2020-07-23: 5 mg via INTRAVENOUS

## 2020-07-23 MED ORDER — FENTANYL CITRATE (PF) 100 MCG/2ML IJ SOLN
INTRAMUSCULAR | Status: DC | PRN
  Administered 2020-07-23 (×2): 50 ug via INTRAVENOUS

## 2020-07-23 MED ORDER — LIDOCAINE HCL (CARDIAC) PF 100 MG/5ML IV SOSY
PREFILLED_SYRINGE | INTRAVENOUS | Status: DC | PRN
  Administered 2020-07-23: 40 mg via INTRAVENOUS

## 2020-07-23 MED ORDER — LACTATED RINGERS IV SOLN: INTRAVENOUS | Status: DC | PRN

## 2020-07-23 MED ORDER — LACTATED RINGERS IV SOLN: INTRAVENOUS | Status: DC

## 2020-07-23 MED ORDER — BUPIVACAINE HCL (PF) 0.25 % IJ SOLN
INTRAMUSCULAR | Status: AC
  Filled 2020-07-23: qty 30

## 2020-07-23 SURGICAL SUPPLY — 67 items
BIT DRILL 2.0 LNG QUCK RELEASE (BIT) IMPLANT
BIT DRILL QC 2.8X5 (BIT) ×2 IMPLANT
BLADE CLIPPER SURG (BLADE) IMPLANT
BNDG ELASTIC 3X5.8 VLCR STR LF (GAUZE/BANDAGES/DRESSINGS) IMPLANT
BNDG ELASTIC 4X5.8 VLCR STR LF (GAUZE/BANDAGES/DRESSINGS) ×2 IMPLANT
BNDG ESMARK 4X9 LF (GAUZE/BANDAGES/DRESSINGS) ×2 IMPLANT
BNDG GAUZE ELAST 4 BULKY (GAUZE/BANDAGES/DRESSINGS) ×2 IMPLANT
CORD BIPOLAR FORCEPS 12FT (ELECTRODE) ×2 IMPLANT
COVER SURGICAL LIGHT HANDLE (MISCELLANEOUS) ×2 IMPLANT
COVER WAND RF STERILE (DRAPES) IMPLANT
CUFF TOURN SGL QUICK 18X4 (TOURNIQUET CUFF) ×2 IMPLANT
CUFF TOURN SGL QUICK 24 (TOURNIQUET CUFF)
CUFF TRNQT CYL 24X4X16.5-23 (TOURNIQUET CUFF) IMPLANT
DECANTER SPIKE VIAL GLASS SM (MISCELLANEOUS) IMPLANT
DRAIN TLS ROUND 10FR (DRAIN) IMPLANT
DRAPE OEC MINIVIEW 54X84 (DRAPES) ×2 IMPLANT
DRAPE SURG 17X23 STRL (DRAPES) ×2 IMPLANT
DRILL 2.0 ×2 IMPLANT
DRILL 2.0 LNG QUICK RELEASE (BIT)
DRSG XEROFORM 1X8 (GAUZE/BANDAGES/DRESSINGS) ×2 IMPLANT
GAUZE SPONGE 4X4 12PLY STRL (GAUZE/BANDAGES/DRESSINGS) ×2 IMPLANT
GAUZE XEROFORM 1X8 LF (GAUZE/BANDAGES/DRESSINGS) IMPLANT
GLOVE BIO SURGEON STRL SZ7.5 (GLOVE) ×4 IMPLANT
GLOVE BIO SURGEON STRL SZ8 (GLOVE) ×2 IMPLANT
GLOVE BIOGEL PI IND STRL 8 (GLOVE) ×2 IMPLANT
GLOVE BIOGEL PI INDICATOR 8 (GLOVE) ×2
GOWN STRL REUS W/ TWL LRG LVL3 (GOWN DISPOSABLE) ×2 IMPLANT
GOWN STRL REUS W/ TWL XL LVL3 (GOWN DISPOSABLE) ×2 IMPLANT
GOWN STRL REUS W/TWL LRG LVL3 (GOWN DISPOSABLE) ×4
GOWN STRL REUS W/TWL XL LVL3 (GOWN DISPOSABLE) ×4
GUIDEWIRE ORTHO 0.054X6 (WIRE) ×6 IMPLANT
KIT BASIN OR (CUSTOM PROCEDURE TRAY) ×2 IMPLANT
KIT TURNOVER KIT B (KITS) ×2 IMPLANT
LOOP VESSEL MAXI BLUE (MISCELLANEOUS) IMPLANT
MANIFOLD NEPTUNE II (INSTRUMENTS) IMPLANT
NEEDLE 22X1 1/2 (OR ONLY) (NEEDLE) ×2 IMPLANT
NS IRRIG 1000ML POUR BTL (IV SOLUTION) ×2 IMPLANT
PACK ORTHO EXTREMITY (CUSTOM PROCEDURE TRAY) ×2 IMPLANT
PAD ARMBOARD 7.5X6 YLW CONV (MISCELLANEOUS) IMPLANT
PAD CAST 4YDX4 CTTN HI CHSV (CAST SUPPLIES) ×1 IMPLANT
PADDING CAST COTTON 4X4 STRL (CAST SUPPLIES) ×2
PLATE L LONG NARROW PROX (Plate) ×2 IMPLANT
SCREW ACTK 2 NL HEX 3.5.11 (Screw) ×2 IMPLANT
SCREW CORT FT 18X2.3XLCK HEX (Screw) ×1 IMPLANT
SCREW CORT FT 20X2.3XLCK HEX (Screw) ×2 IMPLANT
SCREW CORT FT 22X2.3XLCK HEX (Screw) ×1 IMPLANT
SCREW CORTICAL LOCKING 2.3X18M (Screw) ×2 IMPLANT
SCREW CORTICAL LOCKING 2.3X20M (Screw) ×4 IMPLANT
SCREW CORTICAL LOCKING 2.3X22M (Screw) ×2 IMPLANT
SCREW NON LOCK 3.5X10MM (Screw) ×4 IMPLANT
SCREW NONLOCK HEX 3.5X12 (Screw) ×2 IMPLANT
SLING ARM FOAM STRAP MED (SOFTGOODS) ×2 IMPLANT
SPONGE LAP 4X18 RFD (DISPOSABLE) IMPLANT
SUT ETHILON 4 0 PS 2 18 (SUTURE) ×4 IMPLANT
SUT MNCRL AB 4-0 PS2 18 (SUTURE) ×2 IMPLANT
SUT PROLENE 3 0 PS 2 (SUTURE) IMPLANT
SUT VIC AB 3-0 FS2 27 (SUTURE) IMPLANT
SUT VIC AB 4-0 PS2 18 (SUTURE) ×2 IMPLANT
SYR CONTROL 10ML LL (SYRINGE) ×2 IMPLANT
SYSTEM CHEST DRAIN TLS 7FR (DRAIN) IMPLANT
TOWEL GREEN STERILE (TOWEL DISPOSABLE) ×2 IMPLANT
TOWEL GREEN STERILE FF (TOWEL DISPOSABLE) ×2 IMPLANT
TRAY CATH 16FR W/PLASTIC CATH (SET/KITS/TRAYS/PACK) ×2 IMPLANT
TUBE CONNECTING 12X1/4 (SUCTIONS) ×2 IMPLANT
TUBE EVACUATION TLS (MISCELLANEOUS) IMPLANT
UNDERPAD 30X36 HEAVY ABSORB (UNDERPADS AND DIAPERS) ×2 IMPLANT
WATER STERILE IRR 1000ML POUR (IV SOLUTION) ×2 IMPLANT

## 2020-07-23 NOTE — Transfer of Care (Signed)
Immediate Anesthesia Transfer of Care Note  Patient: [name]  Procedure(s) Performed: OPEN REDUCTION INTERNAL FIXATION (ORIF) DISTAL RADIAL FRACTURE vs. CLOSED REDUCTION DISTAL RADIAL FRACTURE AND REMOVAL OF HARDWARE (Left )  Patient Location: PACU  Anesthesia Type:General  Level of Consciousness: awake, alert , oriented and patient cooperative  Airway & Oxygen Therapy: Patient Spontanous Breathing and Patient connected to nasal cannula oxygen  Post-op Assessment: Report given to RN and Post -op Vital signs reviewed and stable  Post vital signs: Reviewed and stable  Last Vitals:  Vitals Value Taken Time  BP 145/74 07/23/20 1936  Temp    Pulse 82 07/23/20 1938  Resp 15 07/23/20 1934  SpO2 99 % 07/23/20 1938  Vitals shown include unvalidated device data.  Last Pain:  Vitals:   07/23/20 1344  TempSrc: Oral  PainSc: 0-No pain         Complications: No complications documented.

## 2020-07-23 NOTE — Telephone Encounter (Signed)
Patient was scheduled for 08/07/20 for Dysport. Patient called to advise that she is having surgery and would like to cancel this appointment for now. She states she will call back to reschedule at a later date.

## 2020-07-23 NOTE — Progress Notes (Signed)
Patient took home dose of plavix and aspirin yesterday, Dr Betha Loa stated aspirin is ok but will need to think about the plavix before  Proceeding with procedure today

## 2020-07-23 NOTE — Anesthesia Procedure Notes (Signed)
Procedure Name: LMA Insertion Date/Time: 07/23/2020 5:39 PM Performed by: Tillman Abide, CRNA Pre-anesthesia Checklist: Patient identified, Emergency Drugs available, Suction available and Patient being monitored Patient Re-evaluated:Patient Re-evaluated prior to induction Oxygen Delivery Method: Circle System Utilized Preoxygenation: Pre-oxygenation with 100% oxygen Induction Type: IV induction Ventilation: Mask ventilation without difficulty LMA: LMA inserted LMA Size: 3.0 Number of attempts: 1 Placement Confirmation: positive ETCO2 Tube secured with: Tape Dental Injury: Teeth and Oropharynx as per pre-operative assessment

## 2020-07-23 NOTE — Anesthesia Preprocedure Evaluation (Addendum)
Anesthesia Evaluation  Patient identified by MRN, date of birth, ID band Patient awake    Reviewed: Allergy & Precautions, NPO status , Patient's Chart, lab work & pertinent test results  History of Anesthesia Complications (+) PONV and history of anesthetic complications  Airway Mallampati: II  TM Distance: >3 FB Neck ROM: Full    Dental  (+) Dental Advisory Given, Teeth Intact   Pulmonary former smoker,    Pulmonary exam normal        Cardiovascular + CAD and + Past MI  Normal cardiovascular exam     Neuro/Psych PSYCHIATRIC DISORDERS Depression CVA, Residual Symptoms    GI/Hepatic Neg liver ROS, GERD  Medicated and Controlled,  Endo/Other  Hypothyroidism   Renal/GU negative Renal ROS     Musculoskeletal  (+) Arthritis ,   Abdominal   Peds  Hematology  On plavix    Anesthesia Other Findings Covid test negative   Reproductive/Obstetrics                            Anesthesia Physical Anesthesia Plan  ASA: III  Anesthesia Plan: General   Post-op Pain Management:    Induction: Intravenous  PONV Risk Score and Plan: 4 or greater and Treatment may vary due to age or medical condition, Ondansetron and Dexamethasone  Airway Management Planned: LMA  Additional Equipment: None  Intra-op Plan:   Post-operative Plan: Extubation in OR  Informed Consent: I have reviewed the patients History and Physical, chart, labs and discussed the procedure including the risks, benefits and alternatives for the proposed anesthesia with the patient or authorized representative who has indicated his/her understanding and acceptance.     Dental advisory given  Plan Discussed with: CRNA and Anesthesiologist  Anesthesia Plan Comments:        Anesthesia Quick Evaluation

## 2020-07-23 NOTE — H&P (Signed)
[name] is an 69 y.o. female.   Chief Complaint: left wrist fracture HPI: 69 yo female states she fell over the weekend injuring left wrist.  Seen at [facility] where XR revealed left distal radius and ulna fractures as retained hardware.  Splinted and followed up in office.  She wishes to proceed with operative removal of hardware and ORIF left radius and possibly ulna.  Allergies:  Allergies  Allergen Reactions   Morphine Other (See Comments)    Sick on stomach   Zolpidem Tartrate Other (See Comments)    Sleep walk   Penicillins Rash and Other (See Comments)         Past Medical History:  Diagnosis Date   AMI (acute myocardial infarction) (HCC) 2007   failed PCI to the distal LAD - managed medically   Chronic pain    Complication of anesthesia    CVA (cerebral vascular accident) (HCC) 1999   Degenerative disc disease, cervical    Hypothyroidism    PONV (postoperative nausea and vomiting)    Unstable angina (HCC)     Past Surgical History:  Procedure Laterality Date   COLONOSCOPY WITH PROPOFOL N/A 05/24/2018   PROPOFOL;  Surgeon: West Bali, MD; normal TI, significant looping of the colon, external hemorrhoids.   ENDOMETRIAL ABLATION  3/06   left ankle surgery      Family History: Family History  Problem Relation Age of Onset   Arrhythmia Father        PPM   Colon cancer Neg Hx     Social History:   reports that she has quit smoking. She has never used smokeless tobacco. She reports current alcohol use. She reports that she does not use drugs.  Medications: Medications Prior to Admission  Medication Sig Dispense Refill   ALPRAZolam (XANAX) 0.5 MG tablet Take 0.5 mg by mouth at bedtime.     AMITIZA 24 MCG capsule TAKE 1 CAPSULE TWICE DAILY WITH MEALS (Patient taking differently: Take 24 mcg by mouth 2 (two) times daily with a meal.) 180 capsule 3   aspirin 81 MG tablet Take 81 mg by mouth daily.     calcium-vitamin D (OSCAL WITH D)  500-200 MG-UNIT tablet Take 1 tablet by mouth 2 (two) times daily.     cetirizine (ZYRTEC) 10 MG tablet Take 10 mg by mouth daily as needed for allergies.     Cholecalciferol (VITAMIN D3) 1000 units CAPS Take 1,000 Units by mouth daily.     clopidogrel (PLAVIX) 75 MG tablet Take 1 tablet (75 mg total) by mouth daily. 90 tablet 3   DENTA 5000 PLUS 1.1 % CREA dental cream Place 1 application onto teeth at bedtime.   4   diclofenac Sodium (VOLTAREN) 1 % GEL Apply 4 g topically 3 (three) times daily as needed (pain).     DULoxetine (CYMBALTA) 60 MG capsule TAKE ONE CAPSULE BY MOUTH EVERY DAY (Patient taking differently: Take 60 mg by mouth at bedtime.) 90 capsule 3   FLUoxetine (PROZAC) 20 MG capsule Take 20 mg by mouth daily.     gabapentin (NEURONTIN) 600 MG tablet Take 600 mg by mouth 2 (two) times daily.     guaiFENesin (MUCINEX) 600 MG 12 hr tablet Take 600 mg by mouth 2 (two) times daily as needed for cough or to loosen phlegm.     ibandronate (BONIVA) 150 MG tablet Take 150 mg by mouth every 30 (thirty) days. Take in the morning with a full glass of water, on  an empty stomach, and do not take anything else by mouth or lie down for the next 30 min.     levothyroxine (SYNTHROID, LEVOTHROID) 75 MCG tablet Take 75 mcg by mouth daily before breakfast.     Menthol 5 % PTCH Apply 1 patch topically at bedtime.     NUCYNTA 50 MG tablet Take 50 mg by mouth in the morning, at noon, and at bedtime.  0   NUCYNTA ER 50 MG 12 hr tablet Take 50 mg by mouth every 12 (twelve) hours.   0   pantoprazole (PROTONIX) 40 MG tablet Take 40 mg by mouth daily as needed (for acid reflux).     ranolazine (RANEXA) 500 MG 12 hr tablet Take 1 tablet (500 mg total) by mouth daily. 90 tablet 2   rOPINIRole (REQUIP) 2 MG tablet Take 2 mg by mouth at bedtime.      rosuvastatin (CRESTOR) 10 MG tablet TAKE 1 TABLET DAILY (Patient taking differently: Take 10 mg by mouth at bedtime.) 90 tablet 3    AbobotulinumtoxinA (DYSPORT) 500 units SOLR injection Inject 1,500 Units into the muscle every 3 (three) months.     diphenhydrAMINE (BENADRYL) 12.5 MG/5ML liquid Take 12.5 mg by mouth 4 (four) times daily as needed for allergies.     fluticasone (FLONASE) 50 MCG/ACT nasal spray Place 2 sprays into both nostrils daily as needed for allergies.      nitroGLYCERIN (NITROSTAT) 0.4 MG SL tablet Place 0.4 mg under the tongue every 5 (five) minutes as needed for chest pain.     Study - ORION 4 - inclisiran 300 mg/1.36mL or placebo SQ injection (PI-Stuckey) Inject 300 mg into the skin every 6 (six) months.      Results for orders placed or performed during the hospital encounter of 07/23/20 (from the past 48 hour(s))  Basic metabolic panel per protocol     Status: Abnormal   Collection Time: 07/23/20  1:36 PM  Result Value Ref Range   Sodium 136 135 - 145 mmol/L   Potassium 5.5 (H) 3.5 - 5.1 mmol/L    Comment: HEMOLYSIS AT THIS LEVEL MAY AFFECT RESULT   Chloride 102 98 - 111 mmol/L   CO2 25 22 - 32 mmol/L   Glucose, Bld 82 70 - 99 mg/dL    Comment: Glucose reference range applies only to samples taken after fasting for at least 8 hours.   BUN 11 8 - 23 mg/dL   Creatinine, Ser 0.07 0.44 - 1.00 mg/dL   Calcium 8.9 8.9 - 62.2 mg/dL   GFR, Estimated >63 >33 mL/min    Comment: (NOTE) Calculated using the CKD-EPI Creatinine Equation (2021)    Anion gap 9 5 - 15    Comment: Performed at [facility], [address], [place], [place] [address]    No results found.   A comprehensive review of systems was negative.  Blood pressure (!) 154/69, pulse 98, temperature 98.7 F (37.1 C), temperature source Oral, resp. rate 18, height 5\' 2"  (1.575 m), weight 63 kg, SpO2 98 %.  General appearance: alert, cooperative and appears stated age Head: Normocephalic, without obvious abnormality, atraumatic Neck: supple, symmetrical, trachea midline Cardio: regular rate and rhythm Resp: clear to  auscultation bilaterally Extremities: Intact sensation and capillary refill all digits. Minimal motion in digits left hand. Pulses: 2+ and symmetric Skin: Skin color, texture, turgor normal. No rashes or lesions Neurologic: Grossly normal Incision/Wound: none  Assessment/Plan Left distal radius and ulna fractures with retained hardware.  Plan removal hardware and ORIF left distal radius and possibly ulna.  Non operative and operative treatment options have been discussed with the patient and patient wishes to proceed with operative treatment. Risks, benefits, and alternatives of surgery have been discussed and the patient agrees with the plan of care.   Betha Loa 07/23/2020, 5:20 PM

## 2020-07-23 NOTE — Op Note (Signed)
NAME: [name] MEDICAL RECORD NO: [id number] DATE OF BIRTH: 04/08/1951 FACILITY: [facility] LOCATION: [facility] OR PHYSICIAN: Tami Ribas, MD   OPERATIVE REPORT   DATE OF PROCEDURE: 07/23/20    PREOPERATIVE DIAGNOSIS:   Left radius and ulna fractures with retained hardware   POSTOPERATIVE DIAGNOSIS:   Left radius and ulna fractures with retained hardware   PROCEDURE:   1.  Removal left distal radius volar plate 2.  Open reduction internal fixation left extra-articular radial shaft fracture 3.  Closed treatment distal ulnar shaft fracture   SURGEON:  Betha Loa, M.D.   ASSISTANT: Cindee Salt, MD   ANESTHESIA:  General   INTRAVENOUS FLUIDS:  Per anesthesia flow sheet.   ESTIMATED BLOOD LOSS:  Minimal.   COMPLICATIONS:  None.   SPECIMENS:  none   TOURNIQUET TIME:    Total Tourniquet Time Documented: Upper Arm (Left) - 62 minutes Total: Upper Arm (Left) - 62 minutes    DISPOSITION:  Stable to PACU.   INDICATIONS: 69 year old female with previous open duct internal fixation of left distal radius fracture states she sustained a new fall over the weekend.  She was seen at any pain emergency department where radiographs were taken revealing a bent distal radius plate and new radius and ulna fractures.  She was splinted and follow-up in the office.  She wishes to proceed with operative fixation and removal of previous hardware. Risks, benefits and alternatives of surgery were discussed including the risks of blood loss, infection, damage to nerves, vessels, tendons, ligaments, bone for surgery, need for additional surgery, complications with wound healing, continued pain, nonunion, malunion,  stiffness.  She voiced understanding of these risks and elected to proceed.  OPERATIVE COURSE:  After being identified preoperatively by myself,  the patient and I agreed on the procedure and site of the procedure.  The surgical site was marked.  Surgical consent had been signed. She was given  IV antibiotics as preoperative antibiotic prophylaxis. She was transferred to the operating room and placed on the operating table in supine position with the Left upper extremity on an arm board.  General anesthesia was induced by the anesthesiologist.  Left upper extremity was prepped and draped in normal sterile orthopedic fashion.  A surgical pause was performed between the surgeons, anesthesia, and operating room staff and all were in agreement as to the patient, procedure, and site of procedure.  Tourniquet at the proximal aspect of the extremity was inflated to 250 mmHg after exsanguination of the arm with an Esmarch bandage.    Previous incision was followed and carried into subcutaneous tissues by spreading technique.  Bipolar electrocautery is used to obtain hemostasis.  There was scar formation.  The FCR tendon was identified in the superficial and deep portions of the sheath released.  The median nerve and radial artery were identified and protected throughout the case.  The FPL and FCR were swept ulnarly to protect the median nerve.  The previous plate was identified.  It was cleared of soft tissue coverage.  The screws were removed and the plate removed.  The fracture was reduced under direct visualization.  It was at the distal aspect of the shaft before the metaphysis.  A Acumed volar distal radial plate was selected.  This is a narrow plate with a long shaft portion.  It was secured to the bone with guidepins.  C-arm was used in AP and lateral projections throughout the case to aid in reduction position of hardware.  The plate  position was adjusted until appropriate.  Standard AO drilling and measuring technique was used.  4 screws were placed proximal to the fracture.  The bone was then able to be reduced up to the plate.  The distal locking holes were used to secure the distal fragment.  Locking screws were placed.  Good purchase was obtained.  C-arm was used in AP lateral and oblique projections  to ensure appropriate reduction position of heart which was the case.  The distal ulna fracture had reduced into acceptable position.  It did not require fixation.  The wound was copiously irrigated with sterile saline.  Subcutaneous tissues were closed with inverted interrupted 4-0 Vicryl sutures.  Skin was closed with 4-0 nylon in a horizontal mattress fashion.  The wound was injected with quarter percent plain Marcaine to aid in postoperative analgesia.  Was dressed with sterile Xeroform 4 x 4's and wrapped with a Kerlix bandage.  Volar dorsal slab splint was placed and wrapped with Kerlix and Ace bandage.  The tourniquet was deflated at 62 minutes.  Fingertips were pink with brisk capillary refill after deflation of tourniquet.  The operative  drapes were broken down.  The patient was awoken from anesthesia safely.  She was transferred back to the stretcher and taken to PACU in stable condition.  I will see her back in the office in 1 week for postoperative followup.  She states she takes Nucynta at home and has enough pain medication for postoperative pain control.   Betha Loa, MD Electronically signed, 07/23/20

## 2020-07-23 NOTE — Discharge Instructions (Addendum)

## 2020-07-23 NOTE — Anesthesia Postprocedure Evaluation (Signed)
Anesthesia Post Note  Patient: [name]  Procedure(s) Performed: 1.  Removal left distal radius volar plate 2.  Open reduction internal fixation left extra-articular radial shaft fracture 3.  Closed treatment distal ulnar shaft fracture  (Left Wrist)     Patient location during evaluation: PACU Anesthesia Type: General Level of consciousness: sedated Pain management: pain level controlled Vital Signs Assessment: post-procedure vital signs reviewed and stable Respiratory status: spontaneous breathing and respiratory function stable Cardiovascular status: stable Postop Assessment: no apparent nausea or vomiting Anesthetic complications: no   No complications documented.  Last Vitals:  Vitals:   07/23/20 2018 07/23/20 2034  BP: 133/67 (!) 150/84  Pulse: 82 88  Resp: 12 12  Temp:  36.8 C  SpO2: 94% 94%    Last Pain:  Vitals:   07/23/20 2034  TempSrc:   PainSc: 7                  Danikah Budzik DANIEL

## 2020-07-23 NOTE — Op Note (Signed)
I assisted Surgeon(s) and Role:    Betha Loa, MD - Primary on the Procedure(s): OPEN REDUCTION INTERNAL FIXATION (ORIF) DISTAL RADIAL FRACTURE vs. CLOSED REDUCTION DISTAL RADIAL FRACTURE AND REMOVAL OF HARDWARE on 07/23/2020.  I provided assistance on this case as follows: setup, approach, removal of old plate and screws, application of radial shaft fracture, closure of the wound and application of the dressing and splint.  Electronically signed by: Cindee Salt, MD Date: 07/23/2020 Time: 7:13 PM

## 2020-07-24 ENCOUNTER — Encounter (HOSPITAL_COMMUNITY): Payer: Self-pay | Admitting: Orthopedic Surgery

## 2020-08-07 ENCOUNTER — Ambulatory Visit: Payer: Medicare HMO | Admitting: Neurology

## 2020-10-01 ENCOUNTER — Ambulatory Visit: Payer: Medicare HMO | Admitting: Cardiovascular Disease

## 2020-10-03 ENCOUNTER — Ambulatory Visit: Payer: Medicare HMO | Admitting: Neurology

## 2020-10-08 ENCOUNTER — Other Ambulatory Visit: Payer: Self-pay

## 2020-10-08 ENCOUNTER — Encounter: Payer: Medicare HMO | Admitting: *Deleted

## 2020-10-08 DIAGNOSIS — Z006 Encounter for examination for normal comparison and control in clinical research program: Secondary | ICD-10-CM

## 2020-10-08 NOTE — Research (Signed)
Subject Name: [name]  Subject met inclusion and exclusion criteria.  The informed consent form, study requirements and expectations were reviewed with the subject and questions and concerns were addressed prior to the signing of the consent form.  The subject verbalized understanding of the trial requirements.  The subject agreed to participate in the ORION 4 trial and signed the informed consent at 0955 on 10/08/20  The informed consent was obtained prior to performance of any protocol-specific procedures for the subject.  A copy of the signed informed consent was given to the subject and a copy was placed in the subject's medical record.   Jelesa Mangini, [place] 4 Subject came into the research lab today for their follow-up appointment. Before we began the appointment the subject did sign the Addendum Consent for Orion 4. They received their IP injection into their right abdomen and tolerated it well. There were no new AE's or SAE's to report. All concomitant medications were reviewed and updated if applicable. Their next appointment was scheduled for Tuesday, September 20th, 2022 at 10:00am.

## 2020-10-16 ENCOUNTER — Telehealth: Payer: Self-pay

## 2020-10-16 NOTE — Telephone Encounter (Signed)
Pa was submitted through [email or web address] for Amitiza. waiting gon an approval or denial

## 2020-10-22 ENCOUNTER — Other Ambulatory Visit: Payer: Self-pay | Admitting: *Deleted

## 2020-10-22 ENCOUNTER — Other Ambulatory Visit: Payer: Self-pay

## 2020-10-22 ENCOUNTER — Encounter: Payer: Self-pay | Admitting: Gastroenterology

## 2020-10-22 ENCOUNTER — Ambulatory Visit: Payer: Medicare HMO | Admitting: Gastroenterology

## 2020-10-22 VITALS — BP 135/89 | HR 81 | Temp 97.1°F | Ht 63.0 in | Wt 137.6 lb

## 2020-10-22 DIAGNOSIS — K5909 Other constipation: Secondary | ICD-10-CM

## 2020-10-22 MED ORDER — RANOLAZINE ER 500 MG PO TB12: 500.0000 mg | ORAL_TABLET | Freq: Every day | ORAL | 0 refills | Status: DC

## 2020-10-22 NOTE — Patient Instructions (Signed)
Continue Amitiza twice a day with food.  You can take Senokot twice a day every other day. You can titrate this as needed for what works best for you.  Continue to drink water daily and add back oatmeal to your routine.  Please call if this is not helpful!  We will see you in 6 months!  It was a pleasure to see you today. I want to create trusting relationships with patients to provide genuine, compassionate, and quality care. I value your feedback. If you receive a survey regarding your visit,  I greatly appreciate you taking time to fill this out.   Gelene Mink, PhD, ANP-BC [facility]

## 2020-10-22 NOTE — Progress Notes (Signed)
Referring Provider: Selinda Flavin, MD Primary Care Physician:  Selinda Flavin, MD Primary GI: Dr. Marletta Lor  Chief Complaint  Patient presents with  . Constipation  . Hemorrhoids    No bleeding    HPI:   [name] is a 70 y.o. female presenting today with a history of OIC, returning for follow-up. Previously failing Linzess and Movantik. On Amitiza 24 mcg BID.   BM on Saturday and then again toay. Last week went Wednesday through Saturday. Does weight watchers and eats lots of fruits and veggies. Drinks lots of water. Chamomile tea at nights. Was eating oatmeal for breakfast last week.   Small hemorrhoid and using witch hazel. No itching. Just "there". Tries to keep it clean.   Past Medical History:  Diagnosis Date  . AMI (acute myocardial infarction) (HCC) 2007   failed PCI to the distal LAD - managed medically  . Chronic pain   . Complication of anesthesia   . CVA (cerebral vascular accident) (HCC) 1999  . Degenerative disc disease, cervical   . Hypothyroidism   . PONV (postoperative nausea and vomiting)   . Unstable angina [facility])     Past Surgical History:  Procedure Laterality Date  . COLONOSCOPY WITH PROPOFOL N/A 05/24/2018   normal TI, significant looping of the colon, and external hemorrhoids.  . ENDOMETRIAL ABLATION  3/06  . left ankle surgery    . OPEN REDUCTION INTERNAL FIXATION (ORIF) DISTAL RADIAL FRACTURE Left 07/23/2020   Procedure: 1.  Removal left distal radius volar plate 2.  Open reduction internal fixation left extra-articular radial shaft fracture 3.  Closed treatment distal ulnar shaft fracture ;  Surgeon: Betha Loa, MD;  Location: [facility] OR;  Service: Orthopedics;  Laterality: Left;    Current Outpatient Medications  Medication Sig Dispense Refill  . AbobotulinumtoxinA (DYSPORT) 500 units SOLR injection Inject 1,500 Units into the muscle every 3 (three) months.    . ALPRAZolam (XANAX) 0.5 MG tablet Take 0.5 mg by mouth at bedtime.    . AMITIZA  24 MCG capsule TAKE 1 CAPSULE TWICE DAILY WITH MEALS (Patient taking differently: Take 24 mcg by mouth 2 (two) times daily with a meal.) 180 capsule 3  . aspirin 81 MG tablet Take 81 mg by mouth daily.    . calcium-vitamin D (OSCAL WITH D) 500-200 MG-UNIT tablet Take 1 tablet by mouth 2 (two) times daily.    . cetirizine (ZYRTEC) 10 MG tablet Take 10 mg by mouth daily as needed for allergies.    . Cholecalciferol (VITAMIN D3) 1000 units CAPS Take 1,000 Units by mouth daily.    . clopidogrel (PLAVIX) 75 MG tablet Take 1 tablet (75 mg total) by mouth daily. 90 tablet 3  . DENTA 5000 PLUS 1.1 % CREA dental cream Place 1 application onto teeth at bedtime.   4  . diclofenac Sodium (VOLTAREN) 1 % GEL Apply 4 g topically 3 (three) times daily as needed (pain).    Marland Kitchen diphenhydrAMINE (BENADRYL) 12.5 MG/5ML liquid Take 12.5 mg by mouth 4 (four) times daily as needed for allergies.    . DULoxetine (CYMBALTA) 60 MG capsule TAKE ONE CAPSULE BY MOUTH EVERY DAY (Patient taking differently: Take 60 mg by mouth at bedtime.) 90 capsule 3  . fluticasone (FLONASE) 50 MCG/ACT nasal spray Place 2 sprays into both nostrils daily as needed for allergies.     Marland Kitchen gabapentin (NEURONTIN) 600 MG tablet Take 600 mg by mouth 2 (two) times daily.    Marland Kitchen guaiFENesin (MUCINEX)  600 MG 12 hr tablet Take 600 mg by mouth 2 (two) times daily as needed for cough or to loosen phlegm.    . ibandronate (BONIVA) 150 MG tablet Take 150 mg by mouth every 30 (thirty) days. Take in the morning with a full glass of water, on an empty stomach, and do not take anything else by mouth or lie down for the next 30 min.    Marland Kitchen levothyroxine (SYNTHROID, LEVOTHROID) 75 MCG tablet Take 75 mcg by mouth daily before breakfast.    . Menthol 5 % PTCH Apply 1 patch topically at bedtime.    . nitroGLYCERIN (NITROSTAT) 0.4 MG SL tablet Place 0.4 mg under the tongue every 5 (five) minutes as needed for chest pain.    . NUCYNTA 50 MG tablet Take 50 mg by mouth in the  morning, at noon, and at bedtime.  0  . NUCYNTA ER 50 MG 12 hr tablet Take 50 mg by mouth every 12 (twelve) hours.   0  . pantoprazole (PROTONIX) 40 MG tablet Take 40 mg by mouth daily as needed (for acid reflux).    . ranolazine (RANEXA) 500 MG 12 hr tablet Take 1 tablet (500 mg total) by mouth daily. 90 tablet 0  . rOPINIRole (REQUIP) 2 MG tablet Take 2 mg by mouth at bedtime.     . rosuvastatin (CRESTOR) 10 MG tablet TAKE 1 TABLET DAILY (Patient taking differently: Take 10 mg by mouth at bedtime.) 90 tablet 3  . Study - ORION 4 - inclisiran 300 mg/1.84mL or placebo SQ injection (PI-Stuckey) Inject 300 mg into the skin every 6 (six) months.    Marland Kitchen FLUoxetine (PROZAC) 20 MG capsule Take 20 mg by mouth daily. (Patient not taking: Reported on 10/22/2020)     No current facility-administered medications for this visit.    Allergies as of 10/22/2020 - Review Complete 10/22/2020  Allergen Reaction Noted  . Morphine Other (See Comments) 01/01/2009  . Zolpidem tartrate Other (See Comments) 01/01/2009  . Penicillins Rash and Other (See Comments) 01/01/2009    Family History  Problem Relation Age of Onset  . Arrhythmia Father        PPM  . Colon cancer Neg Hx     Social History   Socioeconomic History  . Marital status: Married    Spouse name: Not on file  . Number of children: Not on file  . Years of education: Not on file  . Highest education level: Not on file  Occupational History  . Occupation: disabled  Tobacco Use  . Smoking status: Former Games developer  . Smokeless tobacco: Never Used  . Tobacco comment: Quit in 2005  Vaping Use  . Vaping Use: Never used  Substance and Sexual Activity  . Alcohol use: Yes    Comment: social  . Drug use: No  . Sexual activity: Not Currently  Other Topics Concern  . Not on file  Social History Narrative   Married, no children   Right handed   12th grade   1 cup daily   Social Determinants of Health   Financial Resource Strain: Not on file   Food Insecurity: Not on file  Transportation Needs: Not on file  Physical Activity: Not on file  Stress: Not on file  Social Connections: Not on file    Review of Systems: Gen: Denies fever, chills, anorexia. Denies fatigue, weakness, weight loss.  CV: Denies chest pain, palpitations, syncope, peripheral edema, and claudication. Resp: Denies dyspnea at rest, cough, wheezing, coughing  up blood, and pleurisy. GI: see HPI Derm: Denies rash, itching, dry skin Psych: Denies depression, anxiety, memory loss, confusion. No homicidal or suicidal ideation.  Heme: Denies bruising, bleeding, and enlarged lymph nodes.  Physical Exam: BP 135/89   Pulse 81   Temp (!) 97.1 F (36.2 C) (Temporal)   Ht 5\' 3"  (1.6 m)   Wt 137 lb 9.6 oz (62.4 kg)   BMI 24.37 kg/m  General:   Alert and oriented. No distress noted. Pleasant and cooperative.  Head:  Normocephalic and atraumatic. Eyes:  Conjuctiva clear without scleral icterus. Mouth:  Oral mucosa pink and moist. Good dentition. No lesions. Abdomen:  +BS, soft, non-tender and non-distended. No rebound or guarding. No HSM or masses noted. Rectal: small non-thrombosed external hemorrhoids and skin tags. No mass on DRE. Small grade 3 prolapsing hemorrhoid easily reduced Msk:  Symmetrical without gross deformities. Normal posture. Extremities:  Without edema. Neurologic:  Alert and  oriented x4 Psych:  Alert and cooperative. Normal mood and affect.  ASSESSMENT: [name] is a 70 y.o. female presenting today with a history of OIC, returning for follow-up. Previously failing Linzess and Movantik. On Amitiza 24 mcg BID.   Constipation not ideally managed but has noted improvement when higher fiber diet and eating oatmeal in the mornings. We discussed continuing higher fiber, adding Senokot BID daily to every other day, and titrating as needed.   DRE today due to reports of hemorrhoid. She does have non-thrombosed hemorrhoids and prolapsing  hemorrhoids easily reduced. She is on Plavix, so she is not a candidate for banding. Maximize bowel regimen and continue supportive measures.    PLAN:  Continue Amitiza BID Senokot prn High fiber diet Return in 6 months or sooner if needed  74, PhD, [facility] Gastroenterology

## 2020-11-06 ENCOUNTER — Ambulatory Visit: Payer: Medicare HMO | Admitting: Neurology

## 2020-11-13 ENCOUNTER — Encounter: Payer: Self-pay | Admitting: Neurology

## 2020-11-13 ENCOUNTER — Other Ambulatory Visit: Payer: Self-pay

## 2020-11-13 ENCOUNTER — Ambulatory Visit: Payer: Medicare HMO | Admitting: Neurology

## 2020-11-13 VITALS — BP 119/75 | HR 82 | Ht 63.0 in | Wt 134.5 lb

## 2020-11-13 DIAGNOSIS — I69354 Hemiplegia and hemiparesis following cerebral infarction affecting left non-dominant side: Secondary | ICD-10-CM | POA: Diagnosis not present

## 2020-11-13 MED ORDER — ABOBOTULINUMTOXINA 500 UNITS IM SOLR
1500.0000 [IU] | Freq: Once | INTRAMUSCULAR | Status: AC
  Administered 2020-11-13: 1500 [IU] via INTRAMUSCULAR

## 2020-11-13 NOTE — Progress Notes (Signed)
[facility]  CC:  Spastic hemiplegia  [name] is a 70  y.o. 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 Marya Fossa, received EMG guided Botox injection in 2010 for about 2 years, with good benefit, but has difficulty with insurance  She began to receive Botox injection through our clinic by Dr. Hosie Poisson since May 2015,150 units total to left upper extremity, which has been very helpful  She is return for continued EMG guided Botox injection, at baseline, she can ambulate with a left AFO, but without assistance,  Profound left upper extremity spasticity, achiness, chronic pain, persistent left wrist flexion, finger flexion,  She also has a history of coronary artery disease in 2007, taking Plavix, and Aggrenox, exercise regularly, has a personal trainer  UPDATE March 2nd 2016: She received 300 units in Jun 13 2014, to left upper extremity, which has been very helpful, she did not notice significant side effect. The injection help her open her left hand better, no significant side effect noticed.  UPDATE June 8th 2016: She received 300 units of Botox A in March second 2016, responded very well, she was able to relax her left hand better, she ambulate without assistance with spastic left hemiparesis, complains of left facial, left foot pain, wants the injection to emphasize on her left finger flexion wrist flexion today.  UPDATE Sep 28th 2016: She responded to previous Botox injection very well in June, she wants to emphasize on her left upper extremity at this time, complains forceful finger contraction, flexion, elbow flexion. Only mild left shoulder pain with passive  movement.  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 Patient is concerned about potential side effect of weakness.  Update May 21 2016: She responded very well to previous injection in July, only wants to receive injection for spastic left upper extremity, she had a pair of left wrist/finger splint,  Update September 09 2016: She is now using a left wrist splint, which has been helpful,  UPDATE Dec 10 2016: She responded well to previous injection no significant side effect noticed,   UPDATE Apr 14 2017: She wear left wrist pain, denies significant side effect, complains of left-sided neuropathic pain, despite Prozac 20 mg, Nucynta 50 mg, gabapentin 600 mg twice a day,  UPDATE Jul 14 2017: She responded very well to previous injection in September 2018,  UPDATE October 13 2017: She responded very well to previous Botox injection  UPDATE January 26 2018: She complains of left leg weakness was injection, wants to focus injection on left upper extremity,  UPDATE May 11 2018: She responded well to previous injection  Update August 11, 2018: She responded well to previous injection, no  significant side effect noted  UPDATE January 03 2019: She responded very well to previous injection  Update April 12, 2019: She responded well to previous injection  UPDATE Sep 20 2019: She suffered left wrist fracture in November 2020, required surgery, missed her previous appointment,  UPDATE Dec 20 2019: She did well with previous injection  UPDATE Sept 28 2021: She did well to previous injection  Update November 13, 2020 She missed scheduled injection appointment due to left wrist fracture, now recovering well,    Social History   Socioeconomic History  . Marital status: Married    Spouse name: Not on file  . Number of children: Not on file  . Years of education: Not on file  . Highest education level: Not on file  Occupational History  . Occupation: disabled  Tobacco Use  . Smoking status: Former Games developer  . Smokeless tobacco: Never Used  . Tobacco comment: Quit in 2005  Vaping Use  . Vaping Use: Never used  Substance and Sexual Activity  . Alcohol use: Yes    Comment: social  . Drug use: No  . Sexual activity: Not Currently  Other Topics Concern  . Not on file  Social History Narrative   Married, no children   Right handed   12th grade   1 cup daily   Social Determinants of Health   Financial Resource Strain: Not on file  Food Insecurity: Not on file  Transportation Needs: Not on file  Physical Activity: Not on file  Stress: Not on file  Social Connections: Not on file  Intimate Partner Violence: Not on file    Family History  Problem Relation Age of Onset  . Arrhythmia Father        PPM  . Colon cancer Neg Hx     Past Medical History:  Diagnosis Date  . AMI (acute myocardial infarction) (HCC) 2007   failed PCI to the distal LAD - managed medically  . Chronic pain   . Complication of anesthesia   . CVA (cerebral vascular accident) (HCC) 1999  . Degenerative disc disease, cervical   . Hypothyroidism   . PONV (postoperative nausea and vomiting)   . Unstable angina [facility])     Past Surgical History:  Procedure Laterality Date  . COLONOSCOPY WITH PROPOFOL N/A 05/24/2018   normal TI, significant looping of the colon, and  external hemorrhoids.  . ENDOMETRIAL ABLATION  3/06  . left ankle surgery    . OPEN REDUCTION INTERNAL FIXATION (ORIF) DISTAL RADIAL FRACTURE Left 07/23/2020   Procedure: 1.  Removal left distal radius volar plate 2.  Open reduction internal fixation left extra-articular radial shaft fracture 3.  Closed treatment distal ulnar shaft fracture ;  Surgeon: Betha Loa, MD;  Location: [facility] OR;  Service: Orthopedics;  Laterality: Left;    Current Outpatient Medications  Medication Sig Dispense Refill  . AbobotulinumtoxinA (DYSPORT) 500 units SOLR injection Inject 1,500 Units into the muscle every 3 (three) months.    . ALPRAZolam (XANAX) 0.5 MG tablet Take 0.5 mg by mouth at bedtime.    . AMITIZA 24 MCG capsule TAKE 1 CAPSULE TWICE DAILY WITH MEALS (Patient taking differently: Take 24 mcg by mouth 2 (two) times daily with a meal.) 180 capsule 3  . aspirin 81 MG tablet Take 81 mg by mouth daily.    . calcium-vitamin D (OSCAL WITH D) 500-200 MG-UNIT tablet Take 1 tablet by mouth 2 (two) times daily.    . cetirizine (ZYRTEC) 10 MG  tablet Take 10 mg by mouth daily as needed for allergies.    . Cholecalciferol (VITAMIN D3) 1000 units CAPS Take 1,000 Units by mouth daily.    . clopidogrel (PLAVIX) 75 MG tablet Take 1 tablet (75 mg total) by mouth daily. 90 tablet 3  . DENTA 5000 PLUS 1.1 % CREA dental cream Place 1 application onto teeth at bedtime.   4  . diclofenac Sodium (VOLTAREN) 1 % GEL Apply 4 g topically 3 (three) times daily as needed (pain).    Marland Kitchen diphenhydrAMINE (BENADRYL) 12.5 MG/5ML liquid Take 12.5 mg by mouth 4 (four) times daily as needed for allergies.    . DULoxetine (CYMBALTA) 60 MG capsule TAKE ONE CAPSULE BY MOUTH EVERY DAY (Patient taking differently: Take 60 mg by mouth at bedtime.) 90 capsule 3  . gabapentin (NEURONTIN) 600 MG tablet Take 600 mg by mouth 2 (two) times daily.    Marland Kitchen guaiFENesin (MUCINEX) 600 MG 12 hr tablet Take 600 mg by mouth 2 (two) times daily as needed for cough or  to loosen phlegm.    . ibandronate (BONIVA) 150 MG tablet Take 150 mg by mouth every 30 (thirty) days. Take in the morning with a full glass of water, on an empty stomach, and do not take anything else by mouth or lie down for the next 30 min.    Marland Kitchen levothyroxine (SYNTHROID, LEVOTHROID) 75 MCG tablet Take 75 mcg by mouth daily before breakfast.    . Menthol 5 % PTCH Apply 1 patch topically at bedtime.    . nitroGLYCERIN (NITROSTAT) 0.4 MG SL tablet Place 0.4 mg under the tongue every 5 (five) minutes as needed for chest pain.    . NUCYNTA 50 MG tablet Take 50 mg by mouth in the morning, at noon, and at bedtime.  0  . NUCYNTA ER 50 MG 12 hr tablet Take 50 mg by mouth every 12 (twelve) hours.   0  . pantoprazole (PROTONIX) 40 MG tablet Take 40 mg by mouth daily as needed (for acid reflux).    . ranolazine (RANEXA) 500 MG 12 hr tablet Take 1 tablet (500 mg total) by mouth daily. 90 tablet 0  . rOPINIRole (REQUIP) 2 MG tablet Take 2 mg by mouth at bedtime.     . rosuvastatin (CRESTOR) 10 MG tablet TAKE 1 TABLET DAILY (Patient taking differently: Take 10 mg by mouth at bedtime.) 90 tablet 3  . Study - [facility] 4 - inclisiran 300 mg/1.7mL or placebo SQ injection (PI-[facility]) Inject 300 mg into the skin every 6 (six) months.    . fluticasone (FLONASE) 50 MCG/ACT nasal spray Place 2 sprays into both nostrils daily as needed for allergies.      No current facility-administered medications for this visit.    Allergies as of 11/13/2020 - Review Complete 11/13/2020  Allergen Reaction Noted  . Morphine Other (See Comments) 01/01/2009  . Zolpidem tartrate Other (See Comments) 01/01/2009  . Penicillins Rash and Other (See Comments) 01/01/2009    Vitals: BP 119/75   Pulse 82   Ht 5\' 3"  (1.6 m)   Wt 134 lb 8 oz (61 kg)   BMI 23.83 kg/m  Last Weight:  Wt Readings from Last 1 Encounters:  11/13/20 134 lb 8 oz (61 kg)   Last Height:   Ht Readings from Last 1 Encounters:  11/13/20 5\' 3"  (1.6 m)    PHYSICAL EXAMINATOINS:    Motor: spastic left hemiparesis,    left lower extremity moderate spasticity, left hip  flexion 4, knee flexion 4, extension 5, Profound left upper extremity spasticity, left shoulder adduction, internal rotation.left elbow flexion, pronation, with passive movement, mild fixed contraction of left elbow, maximum 175, left wrist forceful flexion, finger flexion,thumb in position, even with forceful movement, she could not achieve full extension of her left finger, well-healed left forearm scar at volar surface.  Gait: Rising up from seated position by pushing on chair arm,spastic left hemi-circumferential gait.  Assessment and plan:   70 year old right-handed Caucasian female, status post stroke, with spastic left hemiparesis since 1999, responded very well to previous EMG guided botulism toxin injection, return to clinic for repeat injection  Under electric stimulation, 500 units of Dysport A x 3  was injected into left upper extremity muscles (500 units dysport/2.5cc of NS)  Left pronator teres 0.5cc  Left flexor digitorum profundus  0.5ccx2=1.0 cc Left palmaris longus 0.5cc Left brachialis  0.5 cc Left flexor digitorum superficialis 0.5 cc  Left pectoralis major 0.5ccx2=1.0cc Left latissimus dorsi 0.5x2 cc=1.0 cc  Left tibialis posterior 1.5 cc into 3 injection sites Left flexor digitorum longus 1.0 cc into 2 injection sites   [name], M.D. Ph.D.  [facility] [address] [place], [place] [address] Phone: [phone] Fax:      [phone]

## 2020-11-13 NOTE — Progress Notes (Signed)
**  Dysport 500 units x 3 vials, NDC [address]-6, Lot [id number], Exp 05/02/2021, office supply.//mck,rn**

## 2020-11-14 ENCOUNTER — Other Ambulatory Visit: Payer: Self-pay | Admitting: Cardiovascular Disease

## 2020-11-14 ENCOUNTER — Other Ambulatory Visit: Payer: Self-pay

## 2020-11-14 DIAGNOSIS — K5909 Other constipation: Secondary | ICD-10-CM

## 2020-11-14 NOTE — Telephone Encounter (Signed)
error 

## 2020-11-19 ENCOUNTER — Other Ambulatory Visit: Payer: Self-pay

## 2020-11-19 DIAGNOSIS — K5909 Other constipation: Secondary | ICD-10-CM

## 2020-11-21 MED ORDER — AMITIZA 24 MCG PO CAPS: ORAL_CAPSULE | ORAL | 3 refills | Status: DC

## 2020-12-11 ENCOUNTER — Telehealth: Payer: Self-pay | Admitting: Internal Medicine

## 2020-12-11 ENCOUNTER — Other Ambulatory Visit: Payer: Self-pay | Admitting: Gastroenterology

## 2020-12-11 DIAGNOSIS — K649 Unspecified hemorrhoids: Secondary | ICD-10-CM

## 2020-12-11 MED ORDER — HYDROCORTISONE ACETATE 25 MG RE SUPP: 25.0000 mg | Freq: Two times a day (BID) | RECTAL | 1 refills | Status: DC

## 2020-12-11 NOTE — Telephone Encounter (Signed)
Patient said she needed something for her hemorrhoids and to call a suppository into [facility]. [phone]

## 2020-12-11 NOTE — Telephone Encounter (Signed)
Returned the pt's call and was advised she is dealing with constipation which caused her hemorrhoids to flare up (she is taking her fiber and Senokot as recommended ) but she wants some suppositories for her hemorrhoids sent in to [facility]. (states she would like a good one even if the pharmacist has to compounded. She states I didn't have to call her back because the pharmacy text them when Rx is ready.

## 2020-12-11 NOTE — Telephone Encounter (Signed)
Rx for Anusol suppositories sent to pharmacy.

## 2020-12-12 NOTE — Telephone Encounter (Signed)
Spoke with the pt, advised Rx sent to [facility].

## 2021-01-03 ENCOUNTER — Other Ambulatory Visit: Payer: Self-pay

## 2021-01-03 ENCOUNTER — Ambulatory Visit: Payer: Medicare HMO | Admitting: Cardiovascular Disease

## 2021-01-03 ENCOUNTER — Encounter: Payer: Self-pay | Admitting: Cardiovascular Disease

## 2021-01-03 DIAGNOSIS — I2510 Atherosclerotic heart disease of native coronary artery without angina pectoris: Secondary | ICD-10-CM | POA: Diagnosis not present

## 2021-01-03 DIAGNOSIS — I6350 Cerebral infarction due to unspecified occlusion or stenosis of unspecified cerebral artery: Secondary | ICD-10-CM | POA: Diagnosis not present

## 2021-01-03 DIAGNOSIS — E7800 Pure hypercholesterolemia, unspecified: Secondary | ICD-10-CM

## 2021-01-03 NOTE — Assessment & Plan Note (Signed)
Medically managed myocardial infarction.  She had a failed attempted LAD PCI.  She is doing very well on medical management with aspirin, clopidogrel, and rosuvastatin.  She takes ranolazine once a day but has only had one episode of chest pain that does not sound like angina.  Therefore we will not make any changes at this time.  If she has increased angina would increase this to twice a day.  She is doing a great job with regular exercise.

## 2021-01-03 NOTE — Assessment & Plan Note (Signed)
Lipids are not at goal.  However she is also having myalgias.  We will have her hold her rosuvastatin for 2 weeks and see if this helps.  She is a part of the [facility] for trial.  We will touch base with the research team.  She has not tolerated to statins.  If we need to try another agent could consider a PCSK9 inhibitor or Inclisiran.

## 2021-01-03 NOTE — Assessment & Plan Note (Signed)
Continue aspirin and clopidogrel.

## 2021-01-03 NOTE — Progress Notes (Signed)
Cardiology Office Note:    Date:  01/03/2021   ID:  [name], DOB 1950/09/29, MRN [id number]  PCP:  Selinda Flavin, MD   [facility] Providers Cardiologist:  None     Referring MD: Selinda Flavin, MD   No chief complaint on file.   History of Present Illness:    [name] is a 70 y.o. female with a hx of CAD s/p MI (2007), stroke, hypertension, and Hypothyroidism here for follow up. She was previously a patient of Burnard Hawthorne, NP. She had an acute MI in 2007 with a failed attempt at PCI of the distal LAD. She has been medically managed since that time. She also had a stroke in 1989 and has remained on DAPT, aspirin, and plavix. She had a nuclear stress test in 2017 with LVEF 74% and a small region of apical lateral ischemia. She was seen 01/2018 for fatigue and chest pain and was started on renexa. Her anginal equivalent is jaw pain.  Today, she is accompanied by her husband Gerlene Burdock, who also provides some history. She is feeling good overall. A few weeks ago she had chest pains when laying down that resolved after taking Renexa. Lately, she has been experiencing severe aches in her legs, mostly her right LE. The aching is worse at night, and the most significant pain is usually in her bilateral thighs. Her left LE ache is more related to nerve pain due to her previous stroke. Also, she is finally noticing that her left leg is getting stronger. She has been working out more lately.  She previously stopped going to the gym 2/2 COVID-19.  She began with cardio and has added strength training. She goes to the [facility] twice a week for one hour with no exertional symptoms. When she doesn't go to the [facility] she has a recumbent bike she uses for 40 minutes. Her diet is typically at goal, and she is a lifetime member of [employer]. She does not eat a lot of red meats. Due to elevated cholesterol last year she was prescribed Crestor. She denies any chest pain, shortness of breath, or  palpitations. No headaches, lightheadedness, or syncope to report. Also has no lower extremity edema, orthopnea or PND.   Past Medical History:  Diagnosis Date  . AMI (acute myocardial infarction) (HCC) 2007   failed PCI to the distal LAD - managed medically  . Chronic pain   . Complication of anesthesia   . CVA (cerebral vascular accident) (HCC) 1999  . Degenerative disc disease, cervical   . Hypothyroidism   . PONV (postoperative nausea and vomiting)   . Unstable angina [facility])     Past Surgical History:  Procedure Laterality Date  . COLONOSCOPY WITH PROPOFOL N/A 05/24/2018   normal TI, significant looping of the colon, and external hemorrhoids.  . ENDOMETRIAL ABLATION  3/06  . left ankle surgery    . OPEN REDUCTION INTERNAL FIXATION (ORIF) DISTAL RADIAL FRACTURE Left 07/23/2020   Procedure: 1.  Removal left distal radius volar plate 2.  Open reduction internal fixation left extra-articular radial shaft fracture 3.  Closed treatment distal ulnar shaft fracture ;  Surgeon: Betha Loa, MD;  Location: [facility] OR;  Service: Orthopedics;  Laterality: Left;    Current Medications: Current Meds  Medication Sig  . AbobotulinumtoxinA (DYSPORT) 500 units SOLR injection Inject 1,500 Units into the muscle every 3 (three) months.  . ALPRAZolam (XANAX) 0.5 MG tablet Take 0.5 mg by mouth at bedtime.  Valinda Hoar  24 MCG capsule TAKE 1 CAPSULE TWICE DAILY WITH MEALS  . aspirin 81 MG tablet Take 81 mg by mouth daily.  . cetirizine (ZYRTEC) 10 MG tablet Take 10 mg by mouth daily as needed for allergies.  . Cholecalciferol (VITAMIN D3) 1000 units CAPS Take 1,000 Units by mouth daily.  . clopidogrel (PLAVIX) 75 MG tablet Take 1 tablet (75 mg total) by mouth daily.  . DENTA 5000 PLUS 1.1 % CREA dental cream Place 1 application onto teeth at bedtime.   . diclofenac Sodium (VOLTAREN) 1 % GEL Apply 4 g topically 3 (three) times daily as needed (pain).  Marland Kitchen diphenhydrAMINE (BENADRYL) 12.5 MG/5ML liquid Take  12.5 mg by mouth 4 (four) times daily as needed for allergies.  . DULoxetine (CYMBALTA) 60 MG capsule TAKE ONE CAPSULE BY MOUTH EVERY DAY (Patient taking differently: Take 60 mg by mouth at bedtime.)  . gabapentin (NEURONTIN) 600 MG tablet Take 600 mg by mouth 2 (two) times daily.  Marland Kitchen guaiFENesin (MUCINEX) 600 MG 12 hr tablet Take 600 mg by mouth 2 (two) times daily as needed for cough or to loosen phlegm.  . hydrocortisone (ANUSOL-HC) 25 MG suppository Place 1 suppository (25 mg total) rectally 2 (two) times daily.  Marland Kitchen ibandronate (BONIVA) 150 MG tablet Take 150 mg by mouth every 30 (thirty) days. Take in the morning with a full glass of water, on an empty stomach, and do not take anything else by mouth or lie down for the next 30 min.  Marland Kitchen levothyroxine (SYNTHROID, LEVOTHROID) 75 MCG tablet Take 75 mcg by mouth daily before breakfast.  . Menthol 5 % PTCH Apply 1 patch topically at bedtime.  . nitroGLYCERIN (NITROSTAT) 0.4 MG SL tablet Place 0.4 mg under the tongue every 5 (five) minutes as needed for chest pain.  . NUCYNTA 50 MG tablet Take 50 mg by mouth in the morning, at noon, and at bedtime.  Raylene Miyamoto ER 50 MG 12 hr tablet Take 50 mg by mouth every 12 (twelve) hours.   . pantoprazole (PROTONIX) 40 MG tablet Take 40 mg by mouth daily as needed (for acid reflux).  . ranolazine (RANEXA) 500 MG 12 hr tablet TAKE 1 TABLET DAILY  . rOPINIRole (REQUIP) 2 MG tablet Take 2 mg by mouth at bedtime.   . rosuvastatin (CRESTOR) 10 MG tablet TAKE 1 TABLET DAILY (Patient taking differently: Take 10 mg by mouth at bedtime.)  . Study - ORION 4 - inclisiran 300 mg/1.35mL or placebo SQ injection (PI-Stuckey) Inject 300 mg into the skin every 6 (six) months.     Allergies:   Morphine, Zolpidem tartrate, and Penicillins   Social History   Socioeconomic History  . Marital status: Married    Spouse name: Not on file  . Number of children: Not on file  . Years of education: Not on file  . Highest education  level: Not on file  Occupational History  . Occupation: disabled  Tobacco Use  . Smoking status: Former Games developer  . Smokeless tobacco: Never Used  . Tobacco comment: Quit in 2005  Vaping Use  . Vaping Use: Never used  Substance and Sexual Activity  . Alcohol use: Yes    Comment: social  . Drug use: No  . Sexual activity: Not Currently  Other Topics Concern  . Not on file  Social History Narrative   Married, no children   Right handed   12th grade   1 cup daily   Social Determinants of Health   Financial  Resource Strain: Not on file  Food Insecurity: Not on file  Transportation Needs: Not on file  Physical Activity: Not on file  Stress: Not on file  Social Connections: Not on file     Family History: The patient's family history includes Arrhythmia in her father. There is no history of Colon cancer.  ROS:   Please see the history of present illness.    (+) Myalgias, bilateral LE (R>L) All other systems reviewed and are negative.  EKGs/Labs/Other Studies Reviewed:    The following studies were reviewed today:  ABI 07/16/2020: Right: Resting right ankle-brachial index is within normal range. No  evidence of significant right lower extremity arterial disease. The right  toe-brachial index is normal.   Left: Resting left ankle-brachial index is within normal range. No  evidence of significant left lower extremity arterial disease. TBIs are  unreliable.   EKG:   01/03/2021: NSR. LAFB. rate 69 bpm, borderline LVH.  Recent Labs: 04/23/2020: ALT 19; Hemoglobin 14.0; Platelets 278 07/23/2020: BUN 11; Creatinine, Ser 0.75; Potassium 5.5; Sodium 136  Recent Lipid Panel    Component Value Date/Time   CHOL 155 04/23/2020 1105   TRIG 97 04/23/2020 1105   HDL 61 04/23/2020 1105   CHOLHDL 2.5 04/23/2020 1105   CHOLHDL 3 09/10/2009 0000   VLDL 22.2 09/10/2009 0000   LDLCALC 76 04/23/2020 1105     Physical Exam:    VS:  BP 118/80 (BP Location: Right Arm, Patient  Position: Sitting, Cuff Size: Normal)   Pulse 69   Resp 18   Ht 5\' 2"  (1.575 m)   Wt 137 lb 12.8 oz (62.5 kg)   SpO2 97%   BMI 25.20 kg/m     Wt Readings from Last 3 Encounters:  01/03/21 137 lb 12.8 oz (62.5 kg)  11/13/20 134 lb 8 oz (61 kg)  10/22/20 137 lb 9.6 oz (62.4 kg)     GEN: Well nourished, well developed in no acute distress HEENT: Normal NECK: No JVD; No carotid bruits LYMPHATICS: No lymphadenopathy CARDIAC: RRR, no murmurs, rubs, gallops RESPIRATORY:  Clear to auscultation without rales, wheezing or rhonchi  ABDOMEN: Soft, non-tender, non-distended MUSCULOSKELETAL:  Trace edema in left LE; No deformity  SKIN: Warm and dry NEUROLOGIC:  Alert and oriented x 3 PSYCHIATRIC:  Normal affect   ASSESSMENT:    1. Atherosclerosis of native coronary artery of native heart without angina pectoris   2. Pure hypercholesterolemia   3. Cerebral artery occlusion with cerebral infarction [facility](HCC)    PLAN:   CAD, NATIVE VESSEL Medically managed myocardial infarction.  She had a failed attempted LAD PCI.  She is doing very well on medical management with aspirin, clopidogrel, and rosuvastatin.  She takes ranolazine once a day but has only had one episode of chest pain that does not sound like angina.  Therefore we will not make any changes at this time.  If she has increased angina would increase this to twice a day.  She is doing a great job with regular exercise.  Pure hypercholesterolemia Lipids are not at goal.  However she is also having myalgias.  We will have her hold her rosuvastatin for 2 weeks and see if this helps.  She is a part of the [employer] for trial.  We will touch base with the research team.  She has not tolerated to statins.  If we need to try another agent could consider a PCSK9 inhibitor or Inclisiran.  Cerebral artery occlusion with cerebral infarction Continue aspirin  and clopidogrel.    Medication Adjustments/Labs and Tests Ordered: Current medicines are  reviewed at length with the patient today.  Concerns regarding medicines are outlined above.  No orders of the defined types were placed in this encounter.  No orders of the defined types were placed in this encounter.   Patient Instructions  Medication Instructions:  HOLD ROSUVASTATIN FOR COUPLE OF WEEKS AND CALL WITH UPDATE   *If you need a refill on your cardiac medications before your next appointment, please call your pharmacy*  Lab Work: NONE   Testing/Procedures: NONE   Follow-Up: At [facility], you and your health needs are our priority.  As part of our continuing mission to provide you with exceptional heart care, we have created designated Provider Care Teams.  These Care Teams include your primary Cardiologist (physician) and Advanced Practice Providers (APPs -  Physician Assistants and Nurse Practitioners) who all work together to provide you with the care you need, when you need it.  We recommend signing up for the patient portal called "MyChart".  Sign up information is provided on this After Visit Summary.  MyChart is used to connect with patients for Virtual Visits (Telemedicine).  Patients are able to view lab/test results, encounter notes, upcoming appointments, etc.  Non-urgent messages can be sent to your provider as well.   To learn more about what you can do with MyChart, go to [email or web address].    Your next appointment:   6 month(s)  The format for your next appointment:   In Person  Provider:   DR [facility] OR NP AT [place] LOCATION         Disposition: Follow-up with Jaisen Wiltrout C. Duke Salvia, MD, [facility] in 6 months.  I,Mathew Stumpf,acting as a Neurosurgeon for Chilton Si, MD.,have documented all relevant documentation on the behalf of Chilton Si, MD,as directed by  Chilton Si, MD while in the presence of Chilton Si, MD.  I, Morganne Haile C. Duke Salvia, MD have reviewed all documentation for this visit.  The documentation of the exam,  diagnosis, procedures, and orders on 01/03/2021 are all accurate and complete.   Signed, Chilton Si, MD  01/03/2021 10:45 AM    [facility]

## 2021-01-03 NOTE — Patient Instructions (Signed)
Medication Instructions:  HOLD ROSUVASTATIN FOR COUPLE OF WEEKS AND CALL WITH UPDATE   *If you need a refill on your cardiac medications before your next appointment, please call your pharmacy*  Lab Work: NONE   Testing/Procedures: NONE   Follow-Up: At [facility], you and your health needs are our priority.  As part of our continuing mission to provide you with exceptional heart care, we have created designated Provider Care Teams.  These Care Teams include your primary Cardiologist (physician) and Advanced Practice Providers (APPs -  Physician Assistants and Nurse Practitioners) who all work together to provide you with the care you need, when you need it.  We recommend signing up for the patient portal called "MyChart".  Sign up information is provided on this After Visit Summary.  MyChart is used to connect with patients for Virtual Visits (Telemedicine).  Patients are able to view lab/test results, encounter notes, upcoming appointments, etc.  Non-urgent messages can be sent to your provider as well.   To learn more about what you can do with MyChart, go to [email or web address].    Your next appointment:   6 month(s)  The format for your next appointment:   In Person  Provider:   DR [facility] OR NP AT [facility] LOCATION

## 2021-01-27 ENCOUNTER — Telehealth: Payer: Self-pay | Admitting: Cardiovascular Disease

## 2021-01-27 DIAGNOSIS — E7800 Pure hypercholesterolemia, unspecified: Secondary | ICD-10-CM

## 2021-01-27 DIAGNOSIS — Z79899 Other long term (current) drug therapy: Secondary | ICD-10-CM

## 2021-01-27 NOTE — Telephone Encounter (Signed)
Returned call to patient who state that she stopped her Crestor on June the 4th and since then her leg pain has gotten better. Advised patient that I would forward message to Dr. Duke Salvia to make her aware. Patient verbalized understanding.

## 2021-01-27 NOTE — Telephone Encounter (Signed)
    Pt c/o medication issue:  1. Name of Medication: Crestor  2. How are you currently taking this medication (dosage and times per day)?   3. Are you having a reaction (difficulty breathing--STAT)?   4. What is your medication issue? Pt would like to let Dr. Wray Kearns know, when she stopped taking crestor her leg pain is gone

## 2021-01-28 ENCOUNTER — Encounter: Payer: Self-pay | Admitting: Cardiovascular Disease

## 2021-01-29 NOTE — Telephone Encounter (Signed)
Returned call to patient, made patient aware of the following recommendations from Dr. Duke Salvia.   OK.  Let's have her try atorvastatin 10mg .  Repeat lipids and CMP in 2-3 months.  If she has recurrent issues let me know.   TCR   Upon ordering Medication and labs- received hard stop that patient is enrolled in cholesterol research trial and that this could alter integrity of study. Patient states that she is still a part of the trial and goes back for injection in September.   Advised patient that I would forward message to Dr. October for review and advice prior to ordering. Patient verbalized understanding.

## 2021-01-31 NOTE — Telephone Encounter (Signed)
Returned call to patient, made patient aware of Dr. Leonides Sake recommendations as seen below.   Chilton Si, MD  You; Regis Bill B, LPN 41 minutes ago (2:25 PM)     OK.  She can just wait until she follows up with them.      Advised patient to call back to office with any issues, questions, or concerns. Patient verbalized understanding.

## 2021-02-12 ENCOUNTER — Ambulatory Visit: Payer: Medicare HMO | Admitting: Neurology

## 2021-02-12 ENCOUNTER — Encounter: Payer: Self-pay | Admitting: Neurology

## 2021-02-12 VITALS — BP 125/82 | HR 81 | Ht 62.0 in | Wt 138.0 lb

## 2021-02-12 DIAGNOSIS — I69354 Hemiplegia and hemiparesis following cerebral infarction affecting left non-dominant side: Secondary | ICD-10-CM

## 2021-02-12 MED ORDER — ABOBOTULINUMTOXINA 500 UNITS IM SOLR
1500.0000 [IU] | Freq: Once | INTRAMUSCULAR | Status: AC
  Administered 2021-02-12: 1500 [IU] via INTRAMUSCULAR

## 2021-02-12 NOTE — Progress Notes (Signed)
[facility]  CC:  Spastic hemiplegia  [name] is a 70  y.o. 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 Marya Fossa, received EMG guided Botox injection in 2010 for about 2 years, with good benefit, but has difficulty with insurance  She began to receive Botox injection through our clinic by Dr. Hosie Poisson since May 2015,150 units total to left upper extremity, which has been very helpful  She is return for continued EMG guided Botox injection, at baseline, she can ambulate with a left AFO, but without assistance,  Profound left upper extremity spasticity, achiness, chronic pain, persistent left wrist flexion, finger flexion,  She also has a history of coronary artery disease in 2007, taking Plavix, and Aggrenox, exercise regularly, has a personal trainer  UPDATE March 2nd 2016: She received 300 units in Jun 13 2014, to left upper extremity, which has been very helpful, she did not notice significant side effect. The injection help her open her left hand better, no significant side effect noticed.  UPDATE June 8th 2016: She received 300 units of Botox A in March second 2016, responded very well, she was able to relax her left hand better, she ambulate without assistance with spastic left hemiparesis, complains of left facial, left foot pain, wants the injection to emphasize on her left finger flexion wrist flexion today.  UPDATE Sep 28th 2016: She responded to previous Botox injection very well in June, she wants to emphasize on her left upper extremity at this time, complains forceful finger contraction, flexion, elbow flexion. Only mild left shoulder pain with passive  movement.  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 Patient is concerned about potential side effect of weakness.  Update May 21 2016: She responded very well to previous injection in July, only wants to receive injection for spastic left upper extremity, she had a pair of left wrist/finger splint,  Update September 09 2016: She is now using a left wrist splint, which has been helpful,  UPDATE Dec 10 2016: She responded well to previous injection no significant side effect noticed,   UPDATE Apr 14 2017: She wear left wrist pain, denies significant side effect, complains of left-sided neuropathic pain, despite Prozac 20 mg, Nucynta 50 mg, gabapentin 600 mg twice a day,  UPDATE Jul 14 2017: She responded very well to previous injection in September 2018,  UPDATE October 13 2017: She responded very well to previous Botox injection  UPDATE January 26 2018: She complains of left leg weakness was injection, wants to focus injection on left upper extremity,  UPDATE May 11 2018: She responded well to previous injection  Update August 11, 2018: She responded well to previous injection, no  significant side effect noted  UPDATE January 03 2019: She responded very well to previous injection  Update April 12, 2019: She responded well to previous injection  UPDATE Sep 20 2019: She suffered left wrist fracture in November 2020, required surgery, missed her previous appointment,  UPDATE Dec 20 2019: She did well with previous injection  UPDATE Sept 28 2021: She did well to previous injection  Update November 13, 2020 She missed scheduled injection appointment due to left wrist fracture, now recovering well,  Update February 12, 2021: She developed a muscle achy pain with previous injection, especially left shoulder, left calf, 1 to limit injection to left upper extremity only today  Social History   Socioeconomic History   Marital status: Married    Spouse name: Not on file   Number of children: Not on file   Years of education: Not on file   Highest education level: Not on file  Occupational History   Occupation: disabled  Tobacco Use   Smoking status: Former    Pack years: 0.00   Smokeless tobacco: Never   Tobacco comments:    Quit in 2005  Vaping Use   Vaping Use: Never used  Substance and Sexual Activity   Alcohol use: Yes    Comment: social   Drug use: No   Sexual activity: Not Currently  Other Topics Concern   Not on file  Social History Narrative   Married, no children   Right handed   12th grade   1 cup daily   Social Determinants of Health   Financial Resource Strain: Not on file  Food Insecurity: Not on file  Transportation Needs: Not on file  Physical Activity: Not on file  Stress: Not on file  Social Connections: Not on file  Intimate Partner Violence: Not on file    Family History  Problem Relation Age of Onset   Arrhythmia Father        PPM   Colon cancer Neg Hx     Past Medical History:  Diagnosis Date   AMI (acute myocardial infarction) (HCC) 2007   failed PCI to the distal LAD - managed medically   Chronic pain    Complication of anesthesia    CVA (cerebral vascular accident) (HCC) 1999   Degenerative disc disease, cervical    Hypothyroidism    PONV (postoperative nausea and vomiting)    Unstable  angina (HCC)     Past Surgical History:  Procedure Laterality Date   COLONOSCOPY WITH PROPOFOL N/A 05/24/2018   normal TI, significant looping of the colon, and external hemorrhoids.   ENDOMETRIAL ABLATION  3/06   left ankle surgery     OPEN REDUCTION INTERNAL FIXATION (ORIF) DISTAL RADIAL FRACTURE Left 07/23/2020   Procedure: 1.  Removal left distal radius volar plate 2.  Open reduction internal fixation left extra-articular radial shaft fracture 3.  Closed treatment distal ulnar shaft fracture ;  Surgeon: Betha Loa, MD;  Location: [facility] OR;  Service: Orthopedics;  Laterality: Left;    Current Outpatient Medications  Medication Sig Dispense Refill   AbobotulinumtoxinA (DYSPORT) 500 units SOLR injection Inject 1,500 Units into the muscle every 3 (three) months.     ALPRAZolam (XANAX) 0.5 MG tablet Take 0.5 mg by mouth at bedtime.     AMITIZA 24 MCG capsule TAKE 1 CAPSULE TWICE DAILY WITH MEALS 60 capsule 3   aspirin 81 MG tablet Take 81 mg by mouth daily.     cetirizine (ZYRTEC) 10 MG tablet Take  10 mg by mouth daily as needed for allergies.     Cholecalciferol (VITAMIN D3) 1000 units CAPS Take 1,000 Units by mouth daily.     clopidogrel (PLAVIX) 75 MG tablet Take 1 tablet (75 mg total) by mouth daily. 90 tablet 3   DENTA 5000 PLUS 1.1 % CREA dental cream Place 1 application onto teeth at bedtime.   4   diclofenac Sodium (VOLTAREN) 1 % GEL Apply 4 g topically 3 (three) times daily as needed (pain).     diphenhydrAMINE (BENADRYL) 12.5 MG/5ML liquid Take 12.5 mg by mouth 4 (four) times daily as needed for allergies.     DULoxetine (CYMBALTA) 60 MG capsule TAKE ONE CAPSULE BY MOUTH EVERY DAY (Patient taking differently: Take 60 mg by mouth at bedtime.) 90 capsule 3   gabapentin (NEURONTIN) 600 MG tablet Take 600 mg by mouth 2 (two) times daily.     guaiFENesin (MUCINEX) 600 MG 12 hr tablet Take 600 mg by mouth 2 (two) times daily as needed for cough or to loosen phlegm.     hydrocortisone  (ANUSOL-HC) 25 MG suppository Place 1 suppository (25 mg total) rectally 2 (two) times daily. 14 suppository 1   ibandronate (BONIVA) 150 MG tablet Take 150 mg by mouth every 30 (thirty) days. Take in the morning with a full glass of water, on an empty stomach, and do not take anything else by mouth or lie down for the next 30 min.     levothyroxine (SYNTHROID, LEVOTHROID) 75 MCG tablet Take 75 mcg by mouth daily before breakfast.     Menthol 5 % PTCH Apply 1 patch topically at bedtime.     nitroGLYCERIN (NITROSTAT) 0.4 MG SL tablet Place 0.4 mg under the tongue every 5 (five) minutes as needed for chest pain.     NUCYNTA 50 MG tablet Take 50 mg by mouth in the morning, at noon, and at bedtime.  0   NUCYNTA ER 50 MG 12 hr tablet Take 50 mg by mouth every 12 (twelve) hours.   0   pantoprazole (PROTONIX) 40 MG tablet Take 40 mg by mouth daily as needed (for acid reflux).     ranolazine (RANEXA) 500 MG 12 hr tablet TAKE 1 TABLET DAILY 90 tablet 0   rOPINIRole (REQUIP) 2 MG tablet Take 2 mg by mouth at bedtime.      Study - ORION 4 - inclisiran 300 mg/1.10mL or placebo SQ injection (PI-Stuckey) Inject 300 mg into the skin every 6 (six) months.     fluticasone (FLONASE) 50 MCG/ACT nasal spray Place 2 sprays into both nostrils daily as needed for allergies.      No current facility-administered medications for this visit.    Allergies as of 02/12/2021 - Review Complete 02/12/2021  Allergen Reaction Noted   Morphine Other (See Comments) 01/01/2009   Rosuvastatin Other (See Comments) 01/28/2021   Zolpidem tartrate Other (See Comments) 01/01/2009   Penicillins Rash and Other (See Comments) 01/01/2009    Vitals: BP 125/82   Pulse 81   Ht 5\' 2"  (1.575 m)   Wt 138 lb (62.6 kg)   BMI 25.24 kg/m  Last Weight:  Wt Readings from Last 1 Encounters:  02/12/21 138 lb (62.6 kg)   Last Height:   Ht Readings from Last 1 Encounters:  02/12/21 5\' 2"  (1.575 m)   PHYSICAL EXAMINATOINS:    Motor:  spastic left hemiparesis,    left lower extremity moderate spasticity, left hip flexion 4, knee flexion 4, extension 5,  Profound left upper extremity spasticity, left shoulder adduction, internal rotation.left elbow flexion, pronation, with passive movement, she has full range of motion of left elbow, left wrist forceful flexion, well-healed left forearm volar surface scar, left hand stayed in Position, finger flexion,thumb in position, ,limited range of motion of left finger extension  Gait: Rising up from seated position by pushing on chair arm,spastic left hemi-circumferential gait.  Assessment and plan:   70 year old right-handed Caucasian female, status post stroke, with spastic left hemiparesis since 1999, responded very well to previous EMG guided botulism toxin injection, return to clinic for repeat injection  Under electric stimulation, 500 units of Dysport A x 3  was injected into left upper extremity muscles (500 units dysport/2.5cc of NS)  Left pronator teres 0.5cc x2= 1.0 cc Left flexor digitorum profundus  0.5ccx2=1.0 cc Left palmaris longus 0.5cc Left brachialis  0.5 ccx2= 1.0 cc Left flexor digitorum superficialis 0.5 cc x2= 1.0 cc Left flexor carpi ulnar wrist 0.5 cc Left opponents 0.5 cc  Left pectoralis major 0.5ccx2=1.0cc Left latissimus dorsi 0.5x2 cc=1.0 cc    [name], M.D. Ph.D.  [facility] [address] [place], [place] [address] Phone: [phone] Fax:      [phone]

## 2021-02-12 NOTE — Progress Notes (Signed)
**  Dysport 500 units x 3 vials total, [id number], Lot [id number], Exp 07/02/2021 (for two of the vials), Lot [id number], Exp 05/02/2021 (for one of the vials), office supply.//mck,rn**

## 2021-03-19 ENCOUNTER — Ambulatory Visit (INDEPENDENT_AMBULATORY_CARE_PROVIDER_SITE_OTHER): Payer: Medicare HMO | Admitting: Physician Assistant

## 2021-03-19 ENCOUNTER — Ambulatory Visit (HOSPITAL_COMMUNITY)
Admission: RE | Admit: 2021-03-19 | Discharge: 2021-03-19 | Disposition: A | Discharge status: Home / Self Care | Payer: Medicare HMO | Source: Ambulatory Visit | Attending: Vascular Surgery | Admitting: Vascular Surgery

## 2021-03-19 ENCOUNTER — Other Ambulatory Visit: Payer: Self-pay

## 2021-03-19 ENCOUNTER — Encounter: Payer: Self-pay | Admitting: Physician Assistant

## 2021-03-19 ENCOUNTER — Ambulatory Visit (INDEPENDENT_AMBULATORY_CARE_PROVIDER_SITE_OTHER)
Admission: RE | Admit: 2021-03-19 | Discharge: 2021-03-19 | Disposition: A | Discharge status: Home / Self Care | Payer: Medicare HMO | Source: Ambulatory Visit | Attending: Vascular Surgery | Admitting: Vascular Surgery

## 2021-03-19 VITALS — BP 136/83 | HR 76 | Temp 98.2°F | Ht 62.0 in | Wt 139.8 lb

## 2021-03-19 DIAGNOSIS — I739 Peripheral vascular disease, unspecified: Secondary | ICD-10-CM

## 2021-03-19 NOTE — Progress Notes (Signed)
Office Note     CC:  follow up Requesting Provider:  Selinda Flavin, MD  HPI: [name] is a 70 y.o. ([birth year]) female who presents for follow up of peripheral artery disease and carotid artery stenosis. She was initially seen in December of 2021 after abnormal ABI's on health insurance screening evaluation. 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 At her initial visit she was not having any new or concerning neurological deficits. She was without any lower extremity rest pain, claudication or tissue loss.   Today she denies any amaurosis fugax, other visual changes, slurred speech, facial drooping, or new upper or lower extremity weakness or numbness. 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 She does not have any lower extremity symptoms during her exercise. She occasionally gets cramps in legs at night time. Very intermittently. Denies any claudication symptoms, rest pain or tissue loss. She is currently on Aspirin and Plavix per her Cardiologist.   Atherosclerotic risk factors and other medical problems include coronary artery disease with history of MI, CVA, hyperlipidemia, and she is a former smoker.    The pt is not on a statin for cholesterol management due to intolerance The pt is on a daily aspirin.   Other AC: Plavix The pt is not on medication for hypertension.   The pt is not diabetic Tobacco hx: former, quit 2005  Past Medical History:  Diagnosis Date   AMI (acute myocardial infarction) (HCC) 2007   failed PCI to the distal LAD - managed medically   Chronic pain    Complication of anesthesia    CVA (cerebral vascular accident) (HCC) 1999   Degenerative disc disease, cervical    Hypothyroidism    PONV (postoperative nausea and vomiting)    Unstable angina (HCC)     Past Surgical History:   Procedure Laterality Date   COLONOSCOPY WITH PROPOFOL N/A 05/24/2018   normal TI, significant looping of the colon, and external hemorrhoids.   ENDOMETRIAL ABLATION  3/06   left ankle surgery     OPEN REDUCTION INTERNAL FIXATION (ORIF) DISTAL RADIAL FRACTURE Left 07/23/2020   Procedure: 1.  Removal left distal radius volar plate 2.  Open reduction internal fixation left extra-articular radial shaft fracture 3.  Closed treatment distal ulnar shaft fracture ;  Surgeon: Betha Loa, MD;  Location: [facility] OR;  Service: Orthopedics;  Laterality: Left;    Social History   Socioeconomic History   Marital status: Married    Spouse name: Not on file   Number of children: Not on file   Years of education: Not on file   Highest education level: Not on file  Occupational History   Occupation: disabled  Tobacco Use   Smoking status: Former   Smokeless tobacco: Never   Tobacco comments:    Quit in 2005  Vaping Use   Vaping Use: Never used  Substance and Sexual Activity   Alcohol use: Yes    Comment: social   Drug use: No   Sexual activity: Not Currently  Other Topics Concern   Not on file  Social History Narrative   Married, no children   Right handed   12th grade   1 cup daily   Social Determinants of Health   Financial Resource Strain: Not on file  Food Insecurity: Not on  file  Transportation Needs: Not on file  Physical Activity: Not on file  Stress: Not on file  Social Connections: Not on file  Intimate Partner Violence: Not on file    Family History  Problem Relation Age of Onset   Arrhythmia Father        PPM   Colon cancer Neg Hx     Current Outpatient Medications  Medication Sig Dispense Refill   AbobotulinumtoxinA (DYSPORT) 500 units SOLR injection Inject 1,500 Units into the muscle every 3 (three) months.     ALPRAZolam (XANAX) 0.5 MG tablet Take 0.5 mg by mouth at bedtime.     AMITIZA 24 MCG capsule TAKE 1 CAPSULE TWICE DAILY WITH MEALS 60 capsule 3   aspirin  81 MG tablet Take 81 mg by mouth daily.     cetirizine (ZYRTEC) 10 MG tablet Take 10 mg by mouth daily as needed for allergies.     Cholecalciferol (VITAMIN D3) 1000 units CAPS Take 1,000 Units by mouth daily.     clopidogrel (PLAVIX) 75 MG tablet Take 1 tablet (75 mg total) by mouth daily. 90 tablet 3   DENTA 5000 PLUS 1.1 % CREA dental cream Place 1 application onto teeth at bedtime.   4   diclofenac Sodium (VOLTAREN) 1 % GEL Apply 4 g topically 3 (three) times daily as needed (pain).     diphenhydrAMINE (BENADRYL) 12.5 MG/5ML liquid Take 12.5 mg by mouth 4 (four) times daily as needed for allergies.     DULoxetine (CYMBALTA) 60 MG capsule TAKE ONE CAPSULE BY MOUTH EVERY DAY (Patient taking differently: Take 60 mg by mouth at bedtime.) 90 capsule 3   gabapentin (NEURONTIN) 600 MG tablet Take 600 mg by mouth 2 (two) times daily.     guaiFENesin (MUCINEX) 600 MG 12 hr tablet Take 600 mg by mouth 2 (two) times daily as needed for cough or to loosen phlegm.     hydrocortisone (ANUSOL-HC) 25 MG suppository Place 1 suppository (25 mg total) rectally 2 (two) times daily. 14 suppository 1   ibandronate (BONIVA) 150 MG tablet Take 150 mg by mouth every 30 (thirty) days. Take in the morning with a full glass of water, on an empty stomach, and do not take anything else by mouth or lie down for the next 30 min.     levothyroxine (SYNTHROID, LEVOTHROID) 75 MCG tablet Take 75 mcg by mouth daily before breakfast.     Menthol 5 % PTCH Apply 1 patch topically at bedtime.     nitroGLYCERIN (NITROSTAT) 0.4 MG SL tablet Place 0.4 mg under the tongue every 5 (five) minutes as needed for chest pain.     NUCYNTA 50 MG tablet Take 50 mg by mouth in the morning, at noon, and at bedtime.  0   NUCYNTA ER 50 MG 12 hr tablet Take 50 mg by mouth every 12 (twelve) hours.   0   pantoprazole (PROTONIX) 40 MG tablet Take 40 mg by mouth daily as needed (for acid reflux).     ranolazine (RANEXA) 500 MG 12 hr tablet TAKE 1 TABLET  DAILY 90 tablet 0   rOPINIRole (REQUIP) 2 MG tablet Take 2 mg by mouth at bedtime.      Study - ORION 4 - inclisiran 300 mg/1.40mL or placebo SQ injection (PI-Stuckey) Inject 300 mg into the skin every 6 (six) months.     fluticasone (FLONASE) 50 MCG/ACT nasal spray Place 2 sprays into both nostrils daily as needed for allergies.  No current facility-administered medications for this visit.    Allergies  Allergen Reactions   Morphine Other (See Comments)    Sick on stomach   Rosuvastatin Other (See Comments)    myalgias   Zolpidem Tartrate Other (See Comments)    Sleep walk   Penicillins Rash and Other (See Comments)          REVIEW OF SYSTEMS:  [X]  denotes positive finding, [ ]  denotes negative finding Cardiac  Comments:  Chest pain or chest pressure:    Shortness of breath upon exertion:    Short of breath when lying flat:    Irregular heart rhythm:        Vascular    Pain in calf, thigh, or hip brought on by ambulation:    Pain in feet at night that wakes you up from your sleep:     Blood clot in your veins:    Leg swelling:         Pulmonary    Oxygen at home:    Productive cough:     Wheezing:         Neurologic    Sudden weakness in arms or legs:     Sudden numbness in arms or legs:     Sudden onset of difficulty speaking or slurred speech:    Temporary loss of vision in one eye:     Problems with dizziness:         Gastrointestinal    Blood in stool:     Vomited blood:         Genitourinary    Burning when urinating:     Blood in urine:        Psychiatric    Major depression:         Hematologic    Bleeding problems:    Problems with blood clotting too easily:        Skin    Rashes or ulcers:        Constitutional    Fever or chills:      PHYSICAL EXAMINATION:  Vitals:   03/19/21 1322  BP: 136/83  Pulse: 76  Temp: 98.2 F (36.8 C)  TempSrc: Temporal  SpO2: 90%  Weight: 139 lb 12.8 oz (63.4 kg)  Height: 5\' 2"  (1.575 m)     General:  WDWN in NAD; vital signs documented above Gait: Normal, ambulates with LLE brace HENT: WNL, normocephalic Pulmonary: normal non-labored breathing , without wheezing Cardiac: regular HR, without  Murmurs without carotid bruit Vascular Exam/Pulses:  Right Left  Radial 2+ (normal) 2+ (normal)  Femoral 2+ (normal) 2+ (normal)  Popliteal 2+ (normal) 2+ (normal)  DP 1+ (weak) Not palpable  PT Not palpable Not palpable  Left upper > left lower hemiparesis Extremities: without ischemic changes, without Gangrene , without cellulitis; without open wounds; mild edema LLE. Left leg/ foot cooler than right.  Musculoskeletal: no muscle wasting or atrophy  Neurologic: A&O X 3;  No focal weakness or paresthesias are detected Psychiatric:  The pt has Normal affect.   Non-Invasive Vascular Imaging:   +-------+-----------+-----------+------------+------------+  ABI/TBIToday's ABIToday's TBIPrevious ABIPrevious TBI  +-------+-----------+-----------+------------+------------+  Right  1.15       0.84       1.15        .70           +-------+-----------+-----------+------------+------------+  Left   1.15       0.53       0.99  1.4           +-------+-----------+-----------+------------+------------+  VAS US Carotid: 03/19/21 Summary:  Right Carotid: Velocities in the right ICA are consistent with a 1-39% stenosis.   Left Carotid: Velocities in the left ICA are consistent with a 1-39% stenosis.   Vertebrals:  Bilateral vertebral arteries demonstrate antegrade flow.  Subclavians: Normal flow hemodynamics were seen in bilateral subclavian arteries.   ASSESSMENT/PLAN:: 70 y.o. female here for follow up for peripheral artery disease and evaluate of carotid artery stenosis. She has no lower extremity symptoms of claudication, rest pain or tissue loss. She history of CVA with residual LLE and LUE weakness. She has no new or concerning neurological symptoms.  Her duplex  today shows 1-39% ICA stenosis bilaterally, normal subclavian and vertebral artery flow dynamics.  - ABI's today are essentially unchanged from her prior study - continue Plavix and Aspirin - Her carotid Duplex will be repeated in 5 years - Will have her return in 1 year with repeat ABI's   Graceann Congress, PA-C [facility] [phone]  Clinic MD:   Edilia Bo

## 2021-03-22 ENCOUNTER — Other Ambulatory Visit: Payer: Self-pay | Admitting: Cardiovascular Disease

## 2021-03-24 NOTE — Telephone Encounter (Signed)
Rx request sent to pharmacy.  

## 2021-04-01 ENCOUNTER — Telehealth: Payer: Self-pay | Admitting: Cardiovascular Disease

## 2021-04-01 NOTE — Telephone Encounter (Signed)
Bullins, Cleotilde Neer, RN at 01/31/2021  3:08 PM  Status: Signed  Returned call to patient, made patient aware of Dr. Leonides Sake recommendations as seen below.    Chilton Si, MD  You; Regis Bill B, LPN 41 minutes ago (2:25 PM)        OK.  She can just wait until she follows up with them.         Advised patient to call back to office with any issues, questions, or concerns. Patient verbalized understanding.         Bullins, Cleotilde Neer, RN at 01/29/2021  4:12 PM  Status: Signed  Returned call to patient, made patient aware of the following recommendations from Dr. Duke Salvia.    OK.  Let's have her try atorvastatin 10mg .  Repeat lipids and CMP in 2-3 months.  If she has recurrent issues let me know.   TCR    Upon ordering Medication and labs- received hard stop that patient is enrolled in cholesterol research trial and that this could alter integrity of study. Patient states that she is still a part of the trial and goes back for injection in September.    Advised patient that I would forward message to Dr. October for review and advice prior to ordering. Patient verbalized understanding.       Bullins, Duke Salvia, RN at 01/27/2021  1:27 PM  Status: Signed  Returned call to patient who state that she stopped her Crestor on June the 4th and since then her leg pain has gotten better. Advised patient that I would forward message to Dr. 04-20-2001 to make her aware. Patient verbalized understanding.       Hammer, Angeline S at 01/27/2021  1:19 PM  Status: Signed        Pt c/o medication issue:        Reviewed above with patient and advised to discuss Atorvastatin when she goes for study follow up 9/20 Patient verbalized understanding

## 2021-04-01 NOTE — Telephone Encounter (Signed)
[name] was taken off of cholestrol medicine for two weeks and was told to call in after to report results to Dr. Duke Salvia.. Dr. Duke Salvia was out of the office at the time and [name] was told by the nurse that she would hear back and hasnt heard anything..please advise

## 2021-04-01 NOTE — Telephone Encounter (Signed)
Please advise. Thank you

## 2021-04-22 ENCOUNTER — Other Ambulatory Visit: Payer: Self-pay

## 2021-04-22 DIAGNOSIS — Z006 Encounter for examination for normal comparison and control in clinical research program: Secondary | ICD-10-CM

## 2021-04-22 NOTE — Research (Signed)
Patient seen in clinic for ORION 4 clinical trial. IP medication given in the left lower quadrant of abdomen. Patient tolerated without any complaints. Denies any medication changes nor any adverse events. Next follow up appointment will be October 21, 2021 @ 9am and fasting labs will be drawn at that time.

## 2021-04-24 ENCOUNTER — Other Ambulatory Visit: Payer: Self-pay | Admitting: Family Medicine

## 2021-04-24 DIAGNOSIS — Z1231 Encounter for screening mammogram for malignant neoplasm of breast: Secondary | ICD-10-CM

## 2021-04-29 ENCOUNTER — Ambulatory Visit: Payer: Medicare HMO | Admitting: Gastroenterology

## 2021-04-29 ENCOUNTER — Encounter: Payer: Self-pay | Admitting: Gastroenterology

## 2021-04-29 ENCOUNTER — Other Ambulatory Visit: Payer: Self-pay

## 2021-04-29 VITALS — BP 131/76 | HR 81 | Temp 97.3°F | Ht 63.0 in | Wt 137.6 lb

## 2021-04-29 DIAGNOSIS — K5909 Other constipation: Secondary | ICD-10-CM

## 2021-04-29 MED ORDER — HYDROCORTISONE (PERIANAL) 2.5 % EX CREA: 1.0000 "application " | TOPICAL_CREAM | Freq: Two times a day (BID) | CUTANEOUS | 1 refills | Status: DC

## 2021-04-29 NOTE — Patient Instructions (Signed)
Continue Amitiza twice a day with food.  You can take Senna daily as needed.   You can also use a suppository weekly to twice a week if needed to get things going.  I have sent in the Anusol cream to use per rectum if hemorrhoids flare.  We will see you back in 1 year!  I enjoyed seeing you again today! As you know, I value our relationship and want to provide genuine, compassionate, and quality care. I welcome your feedback. If you receive a survey regarding your visit,  I greatly appreciate you taking time to fill this out. See you next time!  Gelene Mink, PhD, ANP-BC [facility]

## 2021-04-29 NOTE — Progress Notes (Signed)
Referring Provider: Selinda Flavin, MD Primary Care Physician:  Selinda Flavin, MD Primary GI: Dr. Marletta Lor  Chief Complaint  Patient presents with   Constipation    Can go up[ to 4 days without BM. Since weather has changed she has had to increase in taking her pain meds   Hemorrhoids    HPI:   [name] is a 70 y.o. female presenting today with a history of  OIC, returning for follow-up. Previously failing Linzess and Movantik. On Amitiza 24 mcg BID.   She has not had a BM since Sunday. Taking Senokot S just as needed. If takes this, she will have a good BM. BM Fri, Sat, and Sun. No straining. Productive. With colder weather, she has to take more pain medication, which affects constipation. Scant rectal bleeding if straining. Hemorrhoids will flare occasionally but resolves with sitz baths. No rectal pain, itching, prolapsing.   Colonoscopy on file from 2019. Requesting Anusol cream.   Past Medical History:  Diagnosis Date   AMI (acute myocardial infarction) (HCC) 2007   failed PCI to the distal LAD - managed medically   Chronic pain    Complication of anesthesia    CVA (cerebral vascular accident) (HCC) 1999   Degenerative disc disease, cervical    Hypothyroidism    PONV (postoperative nausea and vomiting)    Unstable angina (HCC)     Past Surgical History:  Procedure Laterality Date   COLONOSCOPY WITH PROPOFOL N/A 05/24/2018   normal TI, significant looping of the colon, and external hemorrhoids.   ENDOMETRIAL ABLATION  3/06   left ankle surgery     OPEN REDUCTION INTERNAL FIXATION (ORIF) DISTAL RADIAL FRACTURE Left 07/23/2020   Procedure: 1.  Removal left distal radius volar plate 2.  Open reduction internal fixation left extra-articular radial shaft fracture 3.  Closed treatment distal ulnar shaft fracture ;  Surgeon: Betha Loa, MD;  Location: [facility] OR;  Service: Orthopedics;  Laterality: Left;    Current Outpatient Medications  Medication Sig Dispense Refill    AbobotulinumtoxinA (DYSPORT) 500 units SOLR injection Inject 1,500 Units into the muscle every 3 (three) months.     ALPRAZolam (XANAX) 0.5 MG tablet Take 0.5 mg by mouth at bedtime.     AMITIZA 24 MCG capsule TAKE 1 CAPSULE TWICE DAILY WITH MEALS 60 capsule 3   aspirin 81 MG tablet Take 81 mg by mouth daily.     cetirizine (ZYRTEC) 10 MG tablet Take 10 mg by mouth daily as needed for allergies.     Cholecalciferol (VITAMIN D3) 1000 units CAPS Take 1,000 Units by mouth daily.     clopidogrel (PLAVIX) 75 MG tablet Take 1 tablet (75 mg total) by mouth daily. 90 tablet 3   DENTA 5000 PLUS 1.1 % CREA dental cream Place 1 application onto teeth at bedtime.   4   diclofenac Sodium (VOLTAREN) 1 % GEL Apply 4 g topically 3 (three) times daily as needed (pain).     diphenhydrAMINE (BENADRYL) 12.5 MG/5ML liquid Take 12.5 mg by mouth 4 (four) times daily as needed for allergies.     DULoxetine (CYMBALTA) 60 MG capsule TAKE ONE CAPSULE BY MOUTH EVERY DAY (Patient taking differently: Take 60 mg by mouth at bedtime.) 90 capsule 3   fluticasone (FLONASE) 50 MCG/ACT nasal spray Place 2 sprays into both nostrils daily as needed for allergies.      gabapentin (NEURONTIN) 600 MG tablet Take 600 mg by mouth 2 (two) times daily.  guaiFENesin (MUCINEX) 600 MG 12 hr tablet Take 600 mg by mouth 2 (two) times daily as needed for cough or to loosen phlegm.     hydrocortisone (ANUSOL-HC) 2.5 % rectal cream Place 1 application rectally 2 (two) times daily. For rectal itching, burning, bleeding 30 g 1   ibandronate (BONIVA) 150 MG tablet Take 150 mg by mouth every 30 (thirty) days. Take in the morning with a full glass of water, on an empty stomach, and do not take anything else by mouth or lie down for the next 30 min.     levothyroxine (SYNTHROID, LEVOTHROID) 75 MCG tablet Take 75 mcg by mouth daily before breakfast.     Menthol 5 % PTCH Apply 1 patch topically at bedtime.     nitroGLYCERIN (NITROSTAT) 0.4 MG SL tablet  Place 0.4 mg under the tongue every 5 (five) minutes as needed for chest pain.     NUCYNTA 50 MG tablet Take 50 mg by mouth in the morning, at noon, and at bedtime.  0   NUCYNTA ER 50 MG 12 hr tablet Take 50 mg by mouth every 12 (twelve) hours.   0   pantoprazole (PROTONIX) 40 MG tablet Take 40 mg by mouth daily as needed (for acid reflux).     ranolazine (RANEXA) 500 MG 12 hr tablet Take 1 tablet (500 mg total) by mouth daily. 90 tablet 1   rOPINIRole (REQUIP) 2 MG tablet Take 2 mg by mouth at bedtime.      sennosides-docusate sodium (SENOKOT-S) 8.6-50 MG tablet Take 1 tablet by mouth daily. As needed     Study - ORION 4 - inclisiran 300 mg/1.33mL or placebo SQ injection (PI-Stuckey) Inject 300 mg into the skin every 6 (six) months.     No current facility-administered medications for this visit.    Allergies as of 04/29/2021 - Review Complete 04/29/2021  Allergen Reaction Noted   Morphine Other (See Comments) 01/01/2009   Rosuvastatin Other (See Comments) 01/28/2021   Zolpidem tartrate Other (See Comments) 01/01/2009   Penicillins Rash and Other (See Comments) 01/01/2009    Family History  Problem Relation Age of Onset   Arrhythmia Father        PPM   Colon cancer Neg Hx     Social History   Socioeconomic History   Marital status: Married    Spouse name: Not on file   Number of children: Not on file   Years of education: Not on file   Highest education level: Not on file  Occupational History   Occupation: disabled  Tobacco Use   Smoking status: Former   Smokeless tobacco: Never   Tobacco comments:    Quit in 2005  Vaping Use   Vaping Use: Never used  Substance and Sexual Activity   Alcohol use: Yes    Comment: social   Drug use: No   Sexual activity: Not Currently  Other Topics Concern   Not on file  Social History Narrative   Married, no children   Right handed   12th grade   1 cup daily   Social Determinants of Health   Financial Resource Strain: Not on  file  Food Insecurity: Not on file  Transportation Needs: Not on file  Physical Activity: Not on file  Stress: Not on file  Social Connections: Not on file    Review of Systems: Gen: Denies fever, chills, anorexia. Denies fatigue, weakness, weight loss.  CV: Denies chest pain, palpitations, syncope, peripheral edema, and claudication. Resp:  Denies dyspnea at rest, cough, wheezing, coughing up blood, and pleurisy. GI: see HPI Derm: Denies rash, itching, dry skin Psych: Denies depression, anxiety, memory loss, confusion. No homicidal or suicidal ideation.  Heme: Denies bruising, bleeding, and enlarged lymph nodes.  Physical Exam: BP 131/76   Pulse 81   Temp (!) 97.3 F (36.3 C)   Ht 5\' 3"  (1.6 m)   Wt 137 lb 9.6 oz (62.4 kg)   BMI 24.37 kg/m  General:   Alert and oriented. No distress noted. Pleasant and cooperative.  Head:  Normocephalic and atraumatic. Eyes:  Conjuctiva clear without scleral icterus. Mouth:  mask in place Abdomen:  +BS, soft, non-tender and non-distended. No rebound or guarding. No HSM or masses noted. Msk:  Symmetrical without gross deformities. Normal posture. Extremities:  Without edema. Neurologic:  Alert and  oriented x4 Psych:  Alert and cooperative. Normal mood and affect.  ASSESSMENT: [name] is a 70 y.o. female presenting today with a history of  OIC, returning for follow-up. Previously failing Linzess and Movantik. On Amitiza 24 mcg BID.   Not ideally managed. She has noted improvement if taking Senokot S, so we will have her take this daily. Can also use a suppository weekly to twice weekly if needed. Notes since stroke at age 46, she sometimes does not have the sensation to have a BM.   Symptomatic hemorrhoids at times but not severe. On Plavix so will avoid banding unless persistent bleeding or bothersome symptoms. Will focus on more aggressive bowel regimen.    PLAN:  Continue Amitiza 24 mcg po BID Senokot daily Suppository weekly  to twice weekly if needed Call if worsening hemorrhoids Anusol cream prn Return in 1 year at patient's request  50, PhD, ANP-BC [facility]

## 2021-05-01 ENCOUNTER — Telehealth: Payer: Self-pay | Admitting: Neurology

## 2021-05-01 NOTE — Telephone Encounter (Signed)
Patient's next Dysport injection is 10/19. PA for Dysport through [facility] expires 04/29/21. I filled out [employer] PA form and faxed with notes. Dx: I69.354 (1500 units Dysport).

## 2021-05-05 NOTE — Telephone Encounter (Signed)
Received approval from [facility]. PA #[id number] (05/01/21- 05/01/22).

## 2021-05-21 ENCOUNTER — Ambulatory Visit
Admission: RE | Admit: 2021-05-21 | Discharge: 2021-05-21 | Disposition: A | Discharge status: Home / Self Care | Payer: Medicare HMO | Source: Ambulatory Visit | Attending: Family Medicine | Admitting: Family Medicine

## 2021-05-21 ENCOUNTER — Other Ambulatory Visit: Payer: Self-pay

## 2021-05-21 ENCOUNTER — Ambulatory Visit: Payer: Medicare HMO | Admitting: Neurology

## 2021-05-21 VITALS — BP 142/84 | HR 76 | Ht 62.0 in | Wt 137.0 lb

## 2021-05-21 DIAGNOSIS — I69354 Hemiplegia and hemiparesis following cerebral infarction affecting left non-dominant side: Secondary | ICD-10-CM | POA: Diagnosis not present

## 2021-05-21 DIAGNOSIS — Z1231 Encounter for screening mammogram for malignant neoplasm of breast: Secondary | ICD-10-CM

## 2021-05-21 MED ORDER — ABOBOTULINUMTOXINA 500 UNITS IM SOLR
1500.0000 [IU] | Freq: Once | INTRAMUSCULAR | Status: AC
  Administered 2021-05-21: 1500 [IU] via INTRAMUSCULAR

## 2021-05-21 NOTE — Progress Notes (Signed)
[facility]  CC:  Spastic hemiplegia  [name] is a 70  y.o. 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 Marya Fossa, received EMG guided Botox injection in 2010 for about 2 years, with good benefit, but has difficulty with insurance  She began to receive Botox injection through our clinic by Dr. Hosie Poisson since May 2015,150 units total to left upper extremity, which has been very helpful  She is return for continued EMG guided Botox injection, at baseline, she can ambulate with a left AFO, but without assistance,  Profound left upper extremity spasticity, achiness, chronic pain, persistent left wrist flexion, finger flexion,  She also has a history of coronary artery disease in 2007, taking Plavix, and Aggrenox, exercise regularly, has a personal trainer  UPDATE March 2nd 2016: She received 300 units in Jun 13 2014, to left upper extremity, which has been very helpful, she did not notice significant side effect. The injection help her open her left hand better, no significant side effect noticed.  UPDATE June 8th 2016: She received 300 units of Botox A in March second 2016, responded very well, she was able to relax her left hand better, she ambulate without assistance with spastic left hemiparesis, complains of left facial, left foot pain, wants the injection to emphasize on her left finger flexion wrist flexion today.  UPDATE Sep 28th 2016: She responded to previous Botox injection very well in June, she wants to emphasize on her left upper extremity at this time, complains forceful finger contraction, flexion, elbow flexion. Only mild left shoulder pain with passive  movement.  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 Patient is concerned about potential side effect of weakness.  Update May 21 2016: She responded very well to previous injection in July, only wants to receive injection for spastic left upper extremity, she had a pair of left wrist/finger splint,  Update September 09 2016: She is now using a left wrist splint, which has been helpful,  UPDATE Dec 10 2016: She responded well to previous injection no significant side effect noticed,   UPDATE Apr 14 2017: She wear left wrist pain, denies significant side effect, complains of left-sided neuropathic pain, despite Prozac 20 mg, Nucynta 50 mg, gabapentin 600 mg twice a day,  UPDATE Jul 14 2017: She responded very well to previous injection in September 2018,  UPDATE October 13 2017: She responded very well to previous Botox injection  UPDATE January 26 2018: She complains of left leg weakness was injection, wants to focus injection on left upper extremity,  UPDATE May 11 2018: She responded well to previous injection  Update August 11, 2018: She responded well to previous injection, no  significant side effect noted  UPDATE January 03 2019: She responded very well to previous injection  Update April 12, 2019: She responded well to previous injection  UPDATE Sep 20 2019: She suffered left wrist fracture in November 2020, required surgery, missed her previous appointment,  UPDATE Dec 20 2019: She did well with previous injection  UPDATE Sept 28 2021: She did well to previous injection  Update November 13, 2020 She missed scheduled injection appointment due to left wrist fracture, now recovering well,  Update February 12, 2021: She developed a muscle achy pain with previous injection, especially left shoulder, left calf, 1 to limit injection to left upper extremity only today  Update May 21, 2021: She responded well to previous injection, complains of left ankle pain today  Past medical history   Social History   Socioeconomic History   Marital status: Married    Spouse name: Not on file   Number of children: Not on file   Years of education: Not on file   Highest education level: Not on file  Occupational History   Occupation: disabled  Tobacco Use   Smoking status: Former   Smokeless tobacco: Never   Tobacco comments:    Quit in 2005  Vaping Use   Vaping Use: Never used  Substance and Sexual Activity   Alcohol use: Yes    Comment: social   Drug use: No   Sexual activity: Not Currently  Other Topics Concern   Not on file  Social History Narrative   Married, no children   Right handed   12th grade   1 cup daily   Social Determinants of Health   Financial Resource Strain: Not on file  Food Insecurity: Not on file  Transportation Needs: Not on file  Physical Activity: Not on file  Stress: Not on file  Social Connections: Not on file  Intimate Partner Violence: Not on file    Family History  Problem Relation Age of Onset   Arrhythmia Father        PPM   Colon cancer Neg Hx     Past Medical History:  Diagnosis Date   AMI (acute myocardial infarction) (HCC) 2007   failed PCI to the distal LAD - managed medically   Chronic pain    Complication of anesthesia    CVA (cerebral vascular accident) (HCC) 1999    Degenerative disc disease, cervical    Hypothyroidism    PONV (postoperative nausea and vomiting)    Unstable angina (HCC)     Past Surgical History:  Procedure Laterality Date   COLONOSCOPY WITH PROPOFOL N/A 05/24/2018   normal TI, significant looping of the colon, and external hemorrhoids.   ENDOMETRIAL ABLATION  3/06   left ankle surgery     OPEN REDUCTION INTERNAL FIXATION (ORIF) DISTAL RADIAL FRACTURE Left 07/23/2020   Procedure: 1.  Removal left distal radius volar plate 2.  Open reduction internal fixation left extra-articular radial shaft fracture 3.  Closed treatment distal ulnar shaft fracture ;  Surgeon: Betha Loa, MD;  Location: [facility] OR;  Service: Orthopedics;  Laterality: Left;    Current Outpatient Medications  Medication Sig Dispense Refill   AbobotulinumtoxinA (DYSPORT) 500 units SOLR injection Inject 1,500 Units into the muscle every 3 (three) months.     ALPRAZolam (XANAX) 0.5 MG tablet Take 0.5 mg by mouth at bedtime.     AMITIZA 24 MCG capsule TAKE 1 CAPSULE TWICE DAILY WITH MEALS 60 capsule 3   aspirin 81 MG tablet  Take 81 mg by mouth daily.     cetirizine (ZYRTEC) 10 MG tablet Take 10 mg by mouth daily as needed for allergies.     Cholecalciferol (VITAMIN D3) 1000 units CAPS Take 1,000 Units by mouth daily.     clopidogrel (PLAVIX) 75 MG tablet Take 1 tablet (75 mg total) by mouth daily. 90 tablet 3   DENTA 5000 PLUS 1.1 % CREA dental cream Place 1 application onto teeth at bedtime.   4   diclofenac Sodium (VOLTAREN) 1 % GEL Apply 4 g topically 3 (three) times daily as needed (pain).     diphenhydrAMINE (BENADRYL) 12.5 MG/5ML liquid Take 12.5 mg by mouth 4 (four) times daily as needed for allergies.     DULoxetine (CYMBALTA) 60 MG capsule TAKE ONE CAPSULE BY MOUTH EVERY DAY (Patient taking differently: Take 60 mg by mouth at bedtime.) 90 capsule 3   fluticasone (FLONASE) 50 MCG/ACT nasal spray Place 2 sprays into both nostrils daily as needed for allergies.       gabapentin (NEURONTIN) 600 MG tablet Take 600 mg by mouth 2 (two) times daily.     guaiFENesin (MUCINEX) 600 MG 12 hr tablet Take 600 mg by mouth 2 (two) times daily as needed for cough or to loosen phlegm.     hydrocortisone (ANUSOL-HC) 2.5 % rectal cream Place 1 application rectally 2 (two) times daily. For rectal itching, burning, bleeding 30 g 1   ibandronate (BONIVA) 150 MG tablet Take 150 mg by mouth every 30 (thirty) days. Take in the morning with a full glass of water, on an empty stomach, and do not take anything else by mouth or lie down for the next 30 min.     levothyroxine (SYNTHROID, LEVOTHROID) 75 MCG tablet Take 75 mcg by mouth daily before breakfast.     Menthol 5 % PTCH Apply 1 patch topically at bedtime.     nitroGLYCERIN (NITROSTAT) 0.4 MG SL tablet Place 0.4 mg under the tongue every 5 (five) minutes as needed for chest pain.     NUCYNTA 50 MG tablet Take 50 mg by mouth in the morning, at noon, and at bedtime.  0   NUCYNTA ER 50 MG 12 hr tablet Take 50 mg by mouth every 12 (twelve) hours.   0   pantoprazole (PROTONIX) 40 MG tablet Take 40 mg by mouth daily as needed (for acid reflux).     ranolazine (RANEXA) 500 MG 12 hr tablet Take 1 tablet (500 mg total) by mouth daily. 90 tablet 1   rOPINIRole (REQUIP) 2 MG tablet Take 2 mg by mouth at bedtime.      sennosides-docusate sodium (SENOKOT-S) 8.6-50 MG tablet Take 2 tablets by mouth daily. As needed     Study - ORION 4 - inclisiran 300 mg/1.38mL or placebo SQ injection (PI-Stuckey) Inject 300 mg into the skin every 6 (six) months.     No current facility-administered medications for this visit.    Allergies as of 05/21/2021 - Review Complete 05/21/2021  Allergen Reaction Noted   Morphine Other (See Comments) 01/01/2009   Rosuvastatin Other (See Comments) 01/28/2021   Zolpidem tartrate Other (See Comments) 01/01/2009   Penicillins Rash and Other (See Comments) 01/01/2009    Vitals: BP (!) 142/84 (BP Location: Right Arm,  Patient Position: Sitting)   Pulse 76   Ht 5\' 2"  (1.575 m)   Wt 137 lb (62.1 kg)   BMI 25.06 kg/m  Last Weight:  Wt Readings from Last 1 Encounters:  05/21/21  137 lb (62.1 kg)   Last Height:   Ht Readings from Last 1 Encounters:  05/21/21 5\' 2"  (1.575 m)   PHYSICAL EXAMINATOINS:    Motor: spastic left hemiparesis,    left lower extremity moderate spasticity, left hip flexion 4, knee flexion 4, extension 5,  Profound left upper extremity spasticity, left shoulder adduction, internal rotation.left elbow flexion, pronation, with passive movement, she has full range of motion of left elbow, left wrist forceful flexion, well-healed left forearm volar surface scar, left hand stayed in Position, finger flexion,thumb in position, ,limited range of motion of left finger extension  Gait: Rising up from seated position by pushing on chair arm,spastic left hemi-circumferential gait.  Assessment and plan:   70 year old right-handed Caucasian female, status post stroke, with spastic left hemiparesis since 1999, responded very well to previous EMG guided botulism toxin injection, return to clinic for repeat injection  Under electric stimulation, 500 units of Dysport A x 3  was injected into left upper extremity muscles (500 units dysport/2.5cc of NS)  Left pronator teres 0.5cc x2= 1.0 cc Left flexor digitorum profundus  0.5ccx2=1.0 cc Left palmaris longus 0.5cc Left brachialis  0.5 ccx2= 1.0 cc Left flexor digitorum superficialis 0.5 cc x2= 1.0 cc Left flexor carpi ulnar wrist 0.5 cc Left palmaris longus 0.5 cc  Left pectoralis major 0.5ccx2=1.0cc Left latissimus dorsi 0.5x2 cc=1.0 cc    2000, M.D. Ph.D.  [facility] [address] [place], [place] Phone: [phone] Fax:      [phone]

## 2021-05-21 NOTE — Progress Notes (Signed)
**  Dysport 500 units x 3 vials, NDC [address]-9, Lot [id number], Exp 12/31/2021, office supply.//mck,rn**

## 2021-07-01 ENCOUNTER — Other Ambulatory Visit: Payer: Self-pay | Admitting: Nurse Practitioner

## 2021-07-01 DIAGNOSIS — K5909 Other constipation: Secondary | ICD-10-CM

## 2021-08-05 NOTE — Progress Notes (Signed)
Office Visit    Patient Name: [name] Date of Encounter: 08/06/2021  PCP:  Lianne Moris, PA-C   [facility]  Cardiologist:  Chilton Si, MD  Advanced Practice Provider:  No care team member to display Electrophysiologist:  None      Chief Complaint    [name] is a 71 y.o. female with a hx of CAD s/p MI 2007, CVA, HLD, hypertension, hypothyroidism presents today for follow-up of CAD  Past Medical History    Past Medical History:  Diagnosis Date   AMI (acute myocardial infarction) (HCC) 2007   failed PCI to the distal LAD - managed medically   Chronic pain    Complication of anesthesia    CVA (cerebral vascular accident) (HCC) 1999   Degenerative disc disease, cervical    Hypothyroidism    PONV (postoperative nausea and vomiting)    Unstable angina (HCC)    Past Surgical History:  Procedure Laterality Date   COLONOSCOPY WITH PROPOFOL N/A 05/24/2018   normal TI, significant looping of the colon, and external hemorrhoids.   ENDOMETRIAL ABLATION  3/06   left ankle surgery     OPEN REDUCTION INTERNAL FIXATION (ORIF) DISTAL RADIAL FRACTURE Left 07/23/2020   Procedure: 1.  Removal left distal radius volar plate 2.  Open reduction internal fixation left extra-articular radial shaft fracture 3.  Closed treatment distal ulnar shaft fracture ;  Surgeon: Betha Loa, MD;  Location: [facility] OR;  Service: Orthopedics;  Laterality: Left;    Allergies  Allergies  Allergen Reactions   Morphine Other (See Comments)    Sick on stomach   Rosuvastatin Other (See Comments)    myalgias   Zolpidem Tartrate Other (See Comments)    Sleep walk   Penicillins Rash and Other (See Comments)         History of Present Illness    [name] is a 71 y.o. female with a hx of  CAD s/p MI 2007, CVA, HLD, hypertension, hypothyroidism last seen 01/30/2021 by Dr. Duke Salvia.  Previous CVA in 1989 has remained on DAPT aspirin and Plavix since that time.   She had acute MI in 2007 with failed attempted PCI of the distal LAD.  She has been medically managed since that time.  Nuclear stress testing 2017 with LVEF 74% and small region of apical lateral ischemia.  She was seen in 01/2018 for fatigue and chest pain and started on Ranexa.  Her anginal equivalent is jaw pain.  She was last seen 01/03/2021 by Dr. Duke Salvia.  She previously followed with Norma Fredrickson, NP.  She was doing well.  She had 1 episode of chest pain that was atypical for angina.  She was recommended to continue Ranexa once per day and if had recurrent symptoms consider increasing to twice per day.  She was having some myalgias and was recommended to hold her Crestor.  She is part of the [place] trial.  She presents today for follow-up with her husband. Reports doing well since last seen. Works out at [employer] regularly with a Systems analyst. Reports no shortness of breath nor dyspnea on exertion. Reports no chest pain, pressure, or tightness. No edema, orthopnea, PND. Reports no palpitations. She is concerned regarding cholesterol numbers as cholesterol was checked 03/2021 by primary care and was elevated, reassured that [facility] trial is monitoring.   EKGs/Labs/Other Studies Reviewed:   The following studies were reviewed today:  ABI 07/16/2020: Right: Resting right ankle-brachial index is within  normal range. No  evidence of significant right lower extremity arterial disease. The right  toe-brachial index is normal.   Left: Resting left ankle-brachial index is within normal range. No  evidence of significant left lower extremity arterial disease. TBIs are  unreliable.     EKG:  EKG is  ordered today.  The ekg ordered today demonstrates   Recent Labs: No results found for requested labs within last 8760 hours.  Recent Lipid Panel    Component Value Date/Time   CHOL 155 04/23/2020 1105   TRIG 97 04/23/2020 1105   HDL 61 04/23/2020 1105   CHOLHDL 2.5 04/23/2020 1105   CHOLHDL 3  09/10/2009 0000   VLDL 22.2 09/10/2009 0000   LDLCALC 76 04/23/2020 1105   Home Medications   Current Meds  Medication Sig   AbobotulinumtoxinA (DYSPORT) 500 units SOLR injection Inject 1,500 Units into the muscle every 3 (three) months.   ALPRAZolam (XANAX) 0.5 MG tablet Take 0.5 mg by mouth at bedtime.   AMITIZA 24 MCG capsule TAKE 1 CAPSULE TWICE DAILY WITH MEALS   aspirin 81 MG tablet Take 81 mg by mouth daily.   cetirizine (ZYRTEC) 10 MG tablet Take 10 mg by mouth daily as needed for allergies.   Cholecalciferol (VITAMIN D3) 1000 units CAPS Take 1,000 Units by mouth daily.   clopidogrel (PLAVIX) 75 MG tablet Take 1 tablet (75 mg total) by mouth daily.   DENTA 5000 PLUS 1.1 % CREA dental cream Place 1 application onto teeth at bedtime.    diclofenac Sodium (VOLTAREN) 1 % GEL Apply 4 g topically 3 (three) times daily as needed (pain).   diphenhydrAMINE (BENADRYL) 12.5 MG/5ML liquid Take 12.5 mg by mouth 4 (four) times daily as needed for allergies.   DULoxetine (CYMBALTA) 60 MG capsule TAKE ONE CAPSULE BY MOUTH EVERY DAY (Patient taking differently: Take 60 mg by mouth at bedtime.)   gabapentin (NEURONTIN) 600 MG tablet Take 600 mg by mouth 2 (two) times daily.   guaiFENesin (MUCINEX) 600 MG 12 hr tablet Take 600 mg by mouth 2 (two) times daily as needed for cough or to loosen phlegm.   hydrocortisone (ANUSOL-HC) 2.5 % rectal cream Place 1 application rectally 2 (two) times daily. For rectal itching, burning, bleeding   ibandronate (BONIVA) 150 MG tablet Take 150 mg by mouth every 30 (thirty) days. Take in the morning with a full glass of water, on an empty stomach, and do not take anything else by mouth or lie down for the next 30 min.   levothyroxine (SYNTHROID, LEVOTHROID) 75 MCG tablet Take 75 mcg by mouth daily before breakfast.   Menthol 5 % PTCH Apply 1 patch topically at bedtime.   nitroGLYCERIN (NITROSTAT) 0.4 MG SL tablet Place 0.4 mg under the tongue every 5 (five) minutes as  needed for chest pain.   NUCYNTA 50 MG tablet Take 50 mg by mouth in the morning, at noon, and at bedtime.   NUCYNTA ER 50 MG 12 hr tablet Take 50 mg by mouth every 12 (twelve) hours.    pantoprazole (PROTONIX) 40 MG tablet Take 40 mg by mouth daily as needed (for acid reflux).   ranolazine (RANEXA) 500 MG 12 hr tablet Take 1 tablet (500 mg total) by mouth daily.   rOPINIRole (REQUIP) 2 MG tablet Take 2 mg by mouth at bedtime.    sennosides-docusate sodium (SENOKOT-S) 8.6-50 MG tablet Take 2 tablets by mouth daily. As needed   Study - ORION 4 - inclisiran 300 mg/1.375mL  or placebo SQ injection (PI-Stuckey) Inject 300 mg into the skin every 6 (six) months.     Review of Systems      All other systems reviewed and are otherwise negative except as noted above.  Physical Exam    VS:  BP 108/74    Pulse 69    Ht 5\' 2"  (1.575 m)    Wt 138 lb (62.6 kg)    BMI 25.24 kg/m  , BMI Body mass index is 25.24 kg/m.  Wt Readings from Last 3 Encounters:  08/06/21 138 lb (62.6 kg)  05/21/21 137 lb (62.1 kg)  04/29/21 137 lb 9.6 oz (62.4 kg)     GEN: Well nourished, well developed, in no acute distress. HEENT: normal. Neck: Supple, no JVD, carotid bruits, or masses. Cardiac: RRR, no murmurs, rubs, or gallops. No clubbing, cyanosis, edema.  Radials/PT 2+ and equal bilaterally.  Respiratory:  Respirations regular and unlabored, clear to auscultation bilaterally. GI: Soft, nontender, nondistended. MS: No deformity or atrophy. Skin: Warm and dry, no rash. Neuro:  Strength and sensation are intact. Psych: Normal affect.  Assessment & Plan    CAD-medically managed MI in 2007 with failed attempted LAD PCI. Stable with no anginal symptoms. No indication for ischemic evaluation.  GDMT includes aspirin, plavix, PRN nitroglycerin. Heart healthy diet and regular cardiovascular exercise encouraged.  Exercising regularly at the [facility].  HLD, LDL goal less than 70 -intolerant to statins with myalgias. 04/2020 LDL  76. Presently participating in [place] trial who are monitoring LDL levels.   History of CVA - Continue aspirin, plavix, optimal lipid control as detailed above.   Disposition: Follow up in 6 month(s) with Zgornji Leskovec, MD or APP.  Signed, Chilton Si, NP 08/06/2021, 9:36 AM [facility]

## 2021-08-06 ENCOUNTER — Ambulatory Visit (HOSPITAL_BASED_OUTPATIENT_CLINIC_OR_DEPARTMENT_OTHER): Payer: Medicare HMO | Admitting: Family

## 2021-08-06 ENCOUNTER — Other Ambulatory Visit: Payer: Self-pay

## 2021-08-06 ENCOUNTER — Encounter (HOSPITAL_BASED_OUTPATIENT_CLINIC_OR_DEPARTMENT_OTHER): Payer: Self-pay | Admitting: Family

## 2021-08-06 VITALS — BP 108/74 | HR 69 | Ht 62.0 in | Wt 138.0 lb

## 2021-08-06 DIAGNOSIS — I10 Essential (primary) hypertension: Secondary | ICD-10-CM

## 2021-08-06 DIAGNOSIS — E785 Hyperlipidemia, unspecified: Secondary | ICD-10-CM | POA: Diagnosis not present

## 2021-08-06 DIAGNOSIS — Z8673 Personal history of transient ischemic attack (TIA), and cerebral infarction without residual deficits: Secondary | ICD-10-CM | POA: Diagnosis not present

## 2021-08-06 DIAGNOSIS — I25118 Atherosclerotic heart disease of native coronary artery with other forms of angina pectoris: Secondary | ICD-10-CM | POA: Diagnosis not present

## 2021-08-06 NOTE — Patient Instructions (Addendum)
Medication Instructions:  Your Physician recommend you continue on your current medication as directed.    *If you need a refill on your cardiac medications before your next appointment, please call your pharmacy*   Lab Work: None ordered today   Testing/Procedures: None ordered today   Follow-Up: At [facility], you and your health needs are our priority.  As part of our continuing mission to provide you with exceptional heart care, we have created designated Provider Care Teams.  These Care Teams include your primary Cardiologist (physician) and Advanced Practice Providers (APPs -  Physician Assistants and Nurse Practitioners) who all work together to provide you with the care you need, when you need it.  We recommend signing up for the patient portal called "MyChart".  Sign up information is provided on this After Visit Summary.  MyChart is used to connect with patients for Virtual Visits (Telemedicine).  Patients are able to view lab/test results, encounter notes, upcoming appointments, etc.  Non-urgent messages can be sent to your provider as well.   To learn more about what you can do with MyChart, go to [email or web address].    Your next appointment:   6 month(s)  The format for your next appointment:   In Person  Provider:   Skeet Latch, MD    Other Instructions None today

## 2021-08-20 ENCOUNTER — Ambulatory Visit: Payer: Medicare HMO | Admitting: Neurology

## 2021-08-21 ENCOUNTER — Ambulatory Visit: Payer: Medicare HMO | Admitting: Neurology

## 2021-08-21 ENCOUNTER — Other Ambulatory Visit: Payer: Self-pay

## 2021-08-21 ENCOUNTER — Encounter: Payer: Self-pay | Admitting: Neurology

## 2021-08-21 VITALS — BP 111/75 | HR 83 | Ht 62.0 in

## 2021-08-21 DIAGNOSIS — I69354 Hemiplegia and hemiparesis following cerebral infarction affecting left non-dominant side: Secondary | ICD-10-CM | POA: Diagnosis not present

## 2021-08-21 MED ORDER — ABOBOTULINUMTOXINA 500 UNITS IM SOLR
1500.0000 [IU] | Freq: Once | INTRAMUSCULAR | Status: AC
  Administered 2021-08-21: 1500 [IU] via INTRAMUSCULAR

## 2021-08-21 NOTE — Progress Notes (Signed)
[facility]  CC:  Spastic hemiplegia  [name] is a 71  y.o. Right-handed Caucasian female, return for EMG guided Botox injection for her left spastic hemiparesis,  She suffered stroke in 1999, with residual left spastic hemiparesis, upper extremity more than leg, left visual field cut  She previously was enrolled in Botox research study for spastic upper extremity at [facility] by Dr. Maudry Mayhew, received EMG guided Botox injection in 2010 for about 2 years, with good benefit, but has difficulty with insurance  She began to receive Botox injection through our clinic by Dr. Janann Colonel since May 2015,150 units total to left upper extremity, which has been very helpful  She is return for continued EMG guided Botox injection, at baseline, she can ambulate with a left AFO, but without assistance,  Profound left upper extremity spasticity, achiness, chronic pain, persistent left wrist flexion, finger flexion,  She also has a history of coronary artery disease in 2007, taking Plavix, and Aggrenox, exercise regularly, has a personal trainer  UPDATE March 2nd 2016: She received 300 units in Jun 13 2014, to left upper extremity, which has been very helpful, she did not notice significant side effect. The injection help her open her left hand better, no significant side effect noticed.  UPDATE June 8th 2016: She received 300 units of Botox A in March second 2016, responded very well, she was able to relax her left hand better, she ambulate without assistance with spastic left hemiparesis, complains of left facial, left foot pain, wants the injection to emphasize on her left finger flexion wrist flexion today.  UPDATE Sep 28th 2016: She responded to previous Botox injection very well in June, she wants to emphasize on her left upper extremity at this time, complains forceful finger contraction, flexion, elbow flexion. Only mild left shoulder pain with passive  movement.  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 Patient is concerned about potential side effect of weakness.  Update May 21 2016: She responded very well to previous injection in July, only wants to receive injection for spastic left upper extremity, she had a pair of left wrist/finger splint,  Update September 09 2016: She is now using a left wrist splint, which has been helpful,  UPDATE Dec 10 2016: She responded well to previous injection no significant side effect noticed,   UPDATE Apr 14 2017: She wear left wrist pain, denies significant side effect, complains of left-sided neuropathic pain, despite Prozac 20 mg, Nucynta 50 mg, gabapentin 600 mg twice a day,  UPDATE Jul 14 2017: She responded very well to previous injection in September 2018,  UPDATE October 13 2017: She responded very well to previous Botox injection  UPDATE January 26 2018: She complains of left leg weakness was injection, wants to focus injection on left upper extremity,  UPDATE May 11 2018: She responded well to previous injection  Update August 11, 2018: She responded well to previous injection, no  significant side effect noted  UPDATE January 03 2019: She responded very well to previous injection  Update April 12, 2019: She responded well to previous injection  UPDATE Sep 20 2019: She suffered left wrist fracture in November 2020, required surgery, missed her previous appointment,  UPDATE Dec 20 2019: She did well with previous injection  UPDATE Sept 28 2021: She did well to previous injection  Update November 13, 2020 She missed scheduled injection appointment due to left wrist fracture, now recovering well,  Update February 12, 2021: She developed a muscle achy pain with previous injection, especially left shoulder, left calf, 1 to limit injection to left upper extremity only today  Update May 21, 2021: She responded well to previous injection, complains of left ankle pain today  Update August 21, 2021: She is doing well from previous injection, left upper extremity, continue the same injection pattern   Physical exam  Vitals: BP 111/75    Pulse 83    Ht 5\' 2"  (1.575 m)    BMI 25.24 kg/m  Last Weight:  Wt Readings from Last 1 Encounters:  08/06/21 138 lb (62.6 kg)   Last Height:   Ht Readings from Last 1 Encounters:  08/21/21 5\' 2"  (1.575 m)   PHYSICAL EXAMINATOINS:    Motor: spastic left hemiparesis,    left lower extremity moderate spasticity, left hip flexion 4, knee flexion 4, extension 5,  Profound left upper extremity spasticity, left shoulder adduction, internal rotation.left elbow flexion, pronation, with passive movement, she has full range of motion of left elbow, left wrist forceful flexion, well-healed left forearm volar surface scar, left hand stayed in Position, finger flexion,thumb in position, ,limited range of motion of left finger extension  Gait: Rising up from seated position by pushing on chair arm,spastic left hemi-circumferential gait.  Assessment and plan:   71 year old right-handed Caucasian female, status post stroke, with spastic left hemiparesis since 1999, responded very well to previous EMG guided botulism toxin injection, return to clinic for repeat injection  Under electric stimulation, 500 units of Dysport A  x 3  was injected into left upper extremity muscles (500 units dysport/2.5cc of NS)  Left pronator teres 0.5cc x2= 1.0 cc Left flexor digitorum profundus  0.5ccx2=1.0 cc Left palmaris longus 0.5cc Left brachialis  0.5 ccx2= 1.0 cc Left flexor digitorum superficialis 0.5 cc x2= 1.0 cc Left flexor carpi ulnar wrist 0.5 cc Left palmaris longus 0.5 cc  Left pectoralis major 0.5ccx2=1.0cc Left latissimus dorsi 0.5x2 cc=1.0 cc    Marcial Pacas, M.D. Ph.D.  [facility] [place], [place] [address] Phone: [phone] Fax:      [phone]

## 2021-08-21 NOTE — Progress Notes (Signed)
Dysport 500 units x 3 vials NDC [address]-1 LOT [id number] Exp 03/02/2022 B/B

## 2021-09-20 ENCOUNTER — Other Ambulatory Visit: Payer: Self-pay | Admitting: Cardiovascular Disease

## 2021-09-22 NOTE — Telephone Encounter (Signed)
Rx(s) sent to pharmacy electronically.  

## 2021-10-21 ENCOUNTER — Other Ambulatory Visit: Payer: Self-pay

## 2021-10-21 DIAGNOSIS — Z006 Encounter for examination for normal comparison and control in clinical research program: Secondary | ICD-10-CM

## 2021-10-21 NOTE — Research (Signed)
Patient was seen in the clinic today for ORION 4 trial month 39 visit. All concomitant medications were reviewed and updated as appropriate. She denies any adverse events since her last visit to the research clinic. Labs were drawn @ 0925 in the right [facility] per protocol. IP was then given @ 0930 in the RLQ. Patient tolerated both procedures without complications. Her next appointment is scheduled on Sept 19 @ 0930 for Month 45. No labs will be due at this visit. ?

## 2021-10-30 DIAGNOSIS — Z006 Encounter for examination for normal comparison and control in clinical research program: Secondary | ICD-10-CM

## 2021-10-30 MED ORDER — STUDY - ORION 4 - INCLISIRAN 300 MG/1.5 ML OR PLACEBO SQ INJECTION (PI-STUCKEY): 300.0000 mg | INJECTION | SUBCUTANEOUS | 1 refills | Status: DC

## 2021-11-19 ENCOUNTER — Ambulatory Visit: Payer: Medicare HMO | Admitting: Neurology

## 2021-12-03 ENCOUNTER — Ambulatory Visit: Payer: Medicare HMO | Admitting: Neurology

## 2021-12-03 VITALS — BP 127/80 | HR 67 | Ht 62.0 in | Wt 138.0 lb

## 2021-12-03 DIAGNOSIS — I69354 Hemiplegia and hemiparesis following cerebral infarction affecting left non-dominant side: Secondary | ICD-10-CM

## 2021-12-03 MED ORDER — ABOBOTULINUMTOXINA 500 UNITS IM SOLR
500.0000 [IU] | Freq: Once | INTRAMUSCULAR | Status: AC
  Administered 2021-12-03: 500 [IU] via INTRAMUSCULAR

## 2021-12-03 NOTE — Progress Notes (Signed)
?  [facility] ? ?CC:  Spastic hemiplegia ? ?[name] is a 71  y.o. Right-handed Caucasian female, return for EMG guided Botox injection for her left spastic hemiparesis, ? ?She suffered stroke in 1999, with residual left spastic hemiparesis, upper extremity more than leg, left visual field cut ? ?She previously was enrolled in Botox research study for spastic upper extremity at [facility] by Dr. Marya Fossa, received EMG guided Botox injection in 2010 for about 2 years, with good benefit, but has difficulty with insurance ? ?She began to receive Botox injection through our clinic by Dr. Hosie Poisson since May 2015,150 units total to left upper extremity, which has been very helpful ? ?She is return for continued EMG guided Botox injection, at baseline, she can ambulate with a left AFO, but without assistance,  Profound left upper extremity spasticity, achiness, chronic pain, persistent left wrist flexion, finger flexion, ? ?She also has a history of coronary artery disease in 2007, taking Plavix, and Aggrenox, exercise regularly, has a Systems analyst ? ?She began to receive botulism toxin injection for spastic left hemiparesis since November 2015, initially uses only 300 units, for both left upper and lower extremity, later with the exercise on left upper extremity only, Botox a was changed to Dysport 500 units x 3, per insurance requirement ? 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 ?Physical exam ? ?Vitals: ?BP 127/80   Pulse 67   Ht 5\' 2"  (1.575 m)   Wt 138 lb (62.6 kg)   BMI 25.24 kg/m?  ?Last Weight:  ?Wt Readings from Last 1 Encounters:  ?12/03/21 138 lb (62.6 kg)  ? ?Last Height:   ?Ht Readings from Last 1 Encounters:  ?12/03/21 5\' 2"  (1.575 m)  ? ?PHYSICAL EXAMINATOINS: ?   ?Motor: spastic left hemiparesis,    left lower extremity moderate spasticity, left hip flexion 4, knee flexion 4,  extension 5, ? ?Profound left upper extremity spasticity, left shoulder adduction, internal rotation.left elbow flexion, pronation, with passive movement, she has full range of motion of left elbow, left wrist forceful flexion, well-healed left forearm volar surface scar, left hand stayed in Position, finger flexion,thumb in position, ,limited range of motion of left finger extension ? ?Gait: Rising up from seated position by pushing on chair arm,spastic left hemi-circumferential gait. ? ?Assessment and plan:  ? ?71 year old right-handed Caucasian female, status post stroke, with spastic left hemiparesis since 1999, responded very well to previous EMG guided botulism toxin injection, return to clinic for repeat injection ? ?Under electric stimulation, 500 units of Dysport A x 3  =1500 units total ? (500 units dysport was dissolved into 2.5cc of NS) ? ?Left pronator teres 0.5cc x2= 1.0 cc ?Left flexor digitorum profundus  0.5ccx2=1.0 cc ?Left palmaris longus 0.5cc ?Left brachialis  0.5 ccx2= 1.0 cc ?Left flexor digitorum superficialis 0.5 cc x2= 1.0 cc ?Left flexor carpi ulnar wrist 0.5 cc ?Left palmaris longus 0.5 cc ? ?Left pectoralis major 0.5ccx2=1.0cc ?Left latissimus dorsi 0.5x2 cc=1.0 cc ? ? ? ?78, M.D. Ph.D. ? ?[facility] ?[address] ?[place], [name] ?Phone: [phone] ?Fax:      [phone] ?

## 2021-12-03 NOTE — Progress Notes (Signed)
Dysport 500 units x 3 vials ?[phone] ?Exp 07-02-2022 ?[id number] ? ?B/B ? ?

## 2021-12-25 ENCOUNTER — Telehealth: Payer: Self-pay

## 2021-12-25 MED ORDER — HYDROCORTISONE (PERIANAL) 2.5 % EX CREA: 1.0000 "application " | TOPICAL_CREAM | Freq: Two times a day (BID) | CUTANEOUS | 1 refills | Status: DC

## 2021-12-25 NOTE — Telephone Encounter (Signed)
I sent in Anusol cream to use BID. Please have her call if any issues!

## 2021-12-25 NOTE — Addendum Note (Signed)
Addended by: Gelene Mink on: 12/25/2021 12:38 PM   Modules accepted: Orders

## 2021-12-25 NOTE — Telephone Encounter (Signed)
Phoned and advised the pt of Rx being phoned in and to call us if she has any issues. Pt expressed understanding

## 2021-12-25 NOTE — Telephone Encounter (Signed)
Pt phoned and LMOVM for something to be phoned in for her hemorrhoids to [facility]. Her last ov was 04/29/2021

## 2022-01-29 ENCOUNTER — Telehealth: Payer: Self-pay | Admitting: Neurology

## 2022-01-29 NOTE — Telephone Encounter (Signed)
I called pt got Botox appointment rescheduled due to provider being out.

## 2022-02-18 ENCOUNTER — Ambulatory Visit: Payer: Medicare HMO | Admitting: Neurology

## 2022-03-04 ENCOUNTER — Ambulatory Visit: Payer: Medicare HMO | Admitting: Neurology

## 2022-03-23 ENCOUNTER — Ambulatory Visit: Payer: Medicare HMO | Admitting: Neurology

## 2022-03-23 VITALS — BP 129/82 | HR 73 | Ht 62.0 in | Wt 138.0 lb

## 2022-03-23 DIAGNOSIS — I69354 Hemiplegia and hemiparesis following cerebral infarction affecting left non-dominant side: Secondary | ICD-10-CM

## 2022-03-23 MED ORDER — ABOBOTULINUMTOXINA 500 UNITS IM SOLR: 500.0000 [IU] | Freq: Once | INTRAMUSCULAR | Status: DC

## 2022-03-23 MED ORDER — ABOBOTULINUMTOXINA 500 UNITS IM SOLR
1500.0000 [IU] | Freq: Once | INTRAMUSCULAR | Status: AC
  Administered 2022-03-23: 1500 [IU] via INTRAMUSCULAR

## 2022-03-23 NOTE — Progress Notes (Signed)
  [facility]  CC:  Spastic hemiplegia  [name] is a 71  y.o. Right-handed Caucasian female, return for EMG guided Botox injection for her left spastic hemiparesis,  She suffered stroke in 1999, with residual left spastic hemiparesis, upper extremity more than leg, left visual field cut  She previously was enrolled in Botox research study for spastic upper extremity at [facility] by Dr. Marya Fossa, received EMG guided Botox injection in 2010 for about 2 years, with good benefit, but has difficulty with insurance  She began to receive Botox injection through our clinic by Dr. Hosie Poisson since May 2015,150 units total to left upper extremity, which has been very helpful  She is return for continued EMG guided Botox injection, at baseline, she can ambulate with a left AFO, but without assistance,  Profound left upper extremity spasticity, achiness, chronic pain, persistent left wrist flexion, finger flexion,  She also has a history of coronary artery disease in 2007, taking Plavix, and Aggrenox, exercise regularly, has a Systems analyst  She began to receive botulism toxin injection for spastic left hemiparesis since November 2015, initially uses only 300 units, for both left upper and lower extremity, later with the exercise on left upper extremity only, Botox a was changed to Dysport 500 units x 3, per insurance requirement  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, especially left shoulder, elbow, wrist, injection does help her  UPDATE March 23, 2022: She did well with previous injection, helped her shoulder elbow wrist deep achy pain, no significant side effect,  Physical exam  Vitals: BP 129/82   Pulse 73   Ht 5\' 2"  (1.575 m)   Wt 138 lb (62.6 kg)   BMI 25.24 kg/m  Last Weight:  Wt Readings from Last 1 Encounters:  03/23/22 138 lb (62.6 kg)   Last Height:   Ht Readings from Last 1 Encounters:  03/23/22 5\' 2"  (1.575 m)    PHYSICAL EXAMINATOINS:    Motor: spastic left hemiparesis,    left lower extremity moderate spasticity, left hip flexion 4, knee flexion 4, extension 5,  Profound left upper extremity spasticity, left shoulder adduction, internal rotation.left elbow flexion, pronation, with passive movement, she has full range of motion of left elbow, left wrist forceful flexion, well-healed left forearm volar surface scar, left hand stayed in Position, finger flexion,thumb in position, ,limited range of motion of left finger extension  Gait: Rising up from seated position by pushing on chair arm,spastic left hemi-circumferential gait.  Assessment and plan:   70 year old right-handed Caucasian female, status post stroke, with spastic left hemiparesis since 1999, responded very well to previous EMG guided botulism toxin injection, return to clinic for repeat injection  Under electric stimulation, 500 units of Dysport A x 3  =1500 units total  (500 units dysport was dissolved into 2.5cc of NS)  Left pronator teres 0.5cc x2= 1.0 cc Left flexor digitorum profundus  0.5ccx2=1.0 cc Left palmaris longus 0.5cc Left brachialis  0.5 ccx2= 1.0 cc Left flexor digitorum superficialis 0.5 cc x2= 1.0 cc Left flexor carpi ulnar wrist 0.5 cc  Left pectoralis major 0.5ccx2=1.0 cc Left latissimus dorsi 0.5x3 cc=1.5 cc    79, M.D. Ph.D.  [facility] [address] [place], [address] [place] Phone: [phone] Fax:      [phone]

## 2022-03-23 NOTE — Progress Notes (Signed)
Dysport 500 units x 3 vials [phone] [id number] Exp-10/01/2023 B/B

## 2022-04-08 ENCOUNTER — Other Ambulatory Visit: Payer: Self-pay | Admitting: Gastroenterology

## 2022-04-08 DIAGNOSIS — K5909 Other constipation: Secondary | ICD-10-CM

## 2022-04-14 NOTE — Telephone Encounter (Signed)
[name], it looks like Amitiza is on backorder. Is there another place she wants me to send this to?

## 2022-04-15 ENCOUNTER — Other Ambulatory Visit: Payer: Self-pay

## 2022-04-15 MED ORDER — LUBIPROSTONE 24 MCG PO CAPS: 24.0000 ug | ORAL_CAPSULE | Freq: Two times a day (BID) | ORAL | 3 refills | Status: DC

## 2022-04-16 ENCOUNTER — Telehealth: Payer: Self-pay

## 2022-04-16 NOTE — Telephone Encounter (Signed)
The generic was available and it was sent in. Pt is aware of this.

## 2022-04-16 NOTE — Telephone Encounter (Signed)
It's up to the patient. I don't know if a local pharmacy could have this filled for her (month supply) as needed. The patient just needs to let us know what she would like to do.

## 2022-04-16 NOTE — Telephone Encounter (Signed)
Aundra Millet from [facility] [phone] phoned and advised the pt's Amitiza 24 mcg was on back order and they need to know whether you want another Rx sent in or wait on this one for the time being. Order number regarding this medication is [id number].

## 2022-04-20 ENCOUNTER — Encounter: Payer: Self-pay | Admitting: *Deleted

## 2022-04-21 DIAGNOSIS — Z006 Encounter for examination for normal comparison and control in clinical research program: Secondary | ICD-10-CM

## 2022-04-21 NOTE — Research (Signed)
Patient was seen in the research clinic today for Month 45 visit of the ORION 4 trial. She denies any recent hospitalizations or adverse events since her last research visit. All concomitant medications reviewed and no changes noted at this time. No lab work due at this visit. IP was given in the RLQ. Patient tolerated well without any complaints. Next visit made for March 19 @ 9am.   Current Outpatient Medications:    AbobotulinumtoxinA (DYSPORT) 500 units SOLR injection, Inject 1,500 Units into the muscle every 3 (three) months., Disp: , Rfl:    ALPRAZolam (XANAX) 0.5 MG tablet, Take 0.5 mg by mouth at bedtime., Disp: , Rfl:    aspirin 81 MG tablet, Take 81 mg by mouth daily., Disp: , Rfl:    cetirizine (ZYRTEC) 10 MG tablet, Take 10 mg by mouth daily as needed for allergies., Disp: , Rfl:    Cholecalciferol (VITAMIN D3) 1000 units CAPS, Take 1,000 Units by mouth daily., Disp: , Rfl:    clopidogrel (PLAVIX) 75 MG tablet, Take 1 tablet (75 mg total) by mouth daily., Disp: 90 tablet, Rfl: 3   diclofenac Sodium (VOLTAREN) 1 % GEL, Apply 4 g topically 3 (three) times daily as needed (pain)., Disp: , Rfl:    diphenhydrAMINE (BENADRYL) 12.5 MG/5ML liquid, Take 12.5 mg by mouth 4 (four) times daily as needed for allergies., Disp: , Rfl:    DULoxetine (CYMBALTA) 60 MG capsule, TAKE ONE CAPSULE BY MOUTH EVERY DAY (Patient taking differently: Take 60 mg by mouth at bedtime.), Disp: 90 capsule, Rfl: 3   gabapentin (NEURONTIN) 600 MG tablet, Take 600 mg by mouth 2 (two) times daily., Disp: , Rfl:    guaiFENesin (MUCINEX) 600 MG 12 hr tablet, Take 600 mg by mouth 2 (two) times daily as needed for cough or to loosen phlegm., Disp: , Rfl:    ibandronate (BONIVA) 150 MG tablet, Take 150 mg by mouth every 30 (thirty) days. Take in the morning with a full glass of water, on an empty stomach, and do not take anything else by mouth or lie down for the next 30 min., Disp: , Rfl:    levothyroxine (SYNTHROID, LEVOTHROID)  75 MCG tablet, Take 75 mcg by mouth daily before breakfast., Disp: , Rfl:    lubiprostone (AMITIZA) 24 MCG capsule, Take 1 capsule (24 mcg total) by mouth 2 (two) times daily with a meal., Disp: 60 capsule, Rfl: 3   nitroGLYCERIN (NITROSTAT) 0.4 MG SL tablet, Place 0.4 mg under the tongue every 5 (five) minutes as needed for chest pain., Disp: , Rfl:    NUCYNTA 50 MG tablet, Take 50 mg by mouth in the morning, at noon, and at bedtime., Disp: , Rfl: 0   NUCYNTA ER 50 MG 12 hr tablet, Take 50 mg by mouth every 12 (twelve) hours. , Disp: , Rfl: 0   pantoprazole (PROTONIX) 40 MG tablet, Take 40 mg by mouth daily as needed (for acid reflux)., Disp: , Rfl:    ranolazine (RANEXA) 500 MG 12 hr tablet, TAKE 1 TABLET DAILY, Disp: 90 tablet, Rfl: 2   rOPINIRole (REQUIP) 2 MG tablet, Take 2 mg by mouth at bedtime., Disp: , Rfl:    sennosides-docusate sodium (SENOKOT-S) 8.6-50 MG tablet, Take 2 tablets by mouth daily. As needed, Disp: , Rfl:    Study - ORION 4 - inclisiran 300 mg/1.38mL or placebo SQ injection (PI-Stuckey), Inject 300 mg into the skin every 6 (six) months., Disp: , Rfl:    DENTA 5000 PLUS 1.1 %  CREA dental cream, Place 1 application onto teeth at bedtime. , Disp: , Rfl: 4   fluticasone (FLONASE) 50 MCG/ACT nasal spray, Place 2 sprays into both nostrils daily as needed for allergies. , Disp: , Rfl:    hydrocortisone (ANUSOL-HC) 2.5 % rectal cream, Place 1 application. rectally 2 (two) times daily. For rectal itching, burning, bleeding, Disp: 30 g, Rfl: 1   Menthol 5 % PTCH, Apply 1 patch topically at bedtime., Disp: , Rfl:    Study - ORION 4 - inclisiran 300 mg/1.69mL or placebo SQ injection (PI-Stuckey), Inject 1.5 mLs (300 mg total) into the skin every 6 (six) months., Disp: 1 mL, Rfl: 1  Current Facility-Administered Medications:    AbobotulinumtoxinA (DYSPORT) 500 units injection 500 Units, 500 Units, Intramuscular, Once, Marcial Pacas, MD

## 2022-05-11 ENCOUNTER — Other Ambulatory Visit: Payer: Self-pay | Admitting: Cardiovascular Disease

## 2022-05-11 NOTE — Telephone Encounter (Signed)
Rx(s) sent to pharmacy electronically.  

## 2022-05-13 ENCOUNTER — Encounter (HOSPITAL_BASED_OUTPATIENT_CLINIC_OR_DEPARTMENT_OTHER): Payer: Self-pay | Admitting: Cardiovascular Disease

## 2022-05-13 ENCOUNTER — Encounter (HOSPITAL_BASED_OUTPATIENT_CLINIC_OR_DEPARTMENT_OTHER): Payer: Self-pay

## 2022-05-13 ENCOUNTER — Ambulatory Visit (HOSPITAL_BASED_OUTPATIENT_CLINIC_OR_DEPARTMENT_OTHER): Payer: Medicare HMO | Admitting: Cardiovascular Disease

## 2022-05-13 DIAGNOSIS — I2510 Atherosclerotic heart disease of native coronary artery without angina pectoris: Secondary | ICD-10-CM | POA: Diagnosis not present

## 2022-05-13 DIAGNOSIS — I6350 Cerebral infarction due to unspecified occlusion or stenosis of unspecified cerebral artery: Secondary | ICD-10-CM

## 2022-05-13 DIAGNOSIS — E7800 Pure hypercholesterolemia, unspecified: Secondary | ICD-10-CM

## 2022-05-13 NOTE — Patient Instructions (Signed)
Medication Instructions:  Your Physician recommend you continue on your current medication as directed.    *If you need a refill on your cardiac medications before your next appointment, please call your pharmacy*  Follow-Up: At [facility], you and your health needs are our priority.  As part of our continuing mission to provide you with exceptional heart care, we have created designated Provider Care Teams.  These Care Teams include your primary Cardiologist (physician) and Advanced Practice Providers (APPs -  Physician Assistants and Nurse Practitioners) who all work together to provide you with the care you need, when you need it.  We recommend signing up for the patient portal called "MyChart".  Sign up information is provided on this After Visit Summary.  MyChart is used to connect with patients for Virtual Visits (Telemedicine).  Patients are able to view lab/test results, encounter notes, upcoming appointments, etc.  Non-urgent messages can be sent to your provider as well.   To learn more about what you can do with MyChart, go to [email or web address].    Your next appointment:   Follow up in 6 months with Dr. Oval Linsey- We will mail you a letter when it is time to schedule.

## 2022-05-13 NOTE — Assessment & Plan Note (Signed)
She recently had lab work with her PCP.  She thinks that lipids were checked at that time.  We will get a copy.  She remains in the [place] 4 study as above.

## 2022-05-13 NOTE — Assessment & Plan Note (Signed)
Residual left hemiplegia.  Blood pressures well controlled.  Continue aspirin, clopidogrel, and study lipid meds.

## 2022-05-13 NOTE — Progress Notes (Signed)
Cardiology Office Note:    Date:  05/13/2022   ID:  [name], DOB 1950/11/19, MRN [id number]  PCP:  Lanelle Bal, PA-C   [facility] Providers Cardiologist:  Skeet Latch, MD     Referring MD: Lanelle Bal, PA-C   No chief complaint on file.   History of Present Illness:    [name] is a 71 y.o. female with a hx of CAD s/p MI (2007), stroke, hypertension, and Hypothyroidism here for follow up. She was previously a patient of Denice Bors, NP. She had an acute MI in 2007 with a failed attempt at PCI of the distal LAD. She has been medically managed since that time. She also had a stroke in 1989 and has remained on DAPT, aspirin, and plavix. She had a nuclear stress test in 2017 with LVEF 74% and a small region of apical lateral ischemia. She was seen 01/2018 for fatigue and chest pain and was started on renexa. Her anginal equivalent is jaw pain.  At the last visit she was doing well and had rare episodes of angina. She followed up with Laurann Montana, NP, 08/2021 and was stable.   Today, she says she has been doing well. She reports that the other night in bed she was awoken by chest discomfort. She took her Protonix and the feeling resolved. She has been exercising on Mondays and Wednesdays at the [facility] and goes for bike rides Sundays and Tuesdays. She has been feeling good throughout her exercise. She still goes to [employer] and has been working hard to maintain her weight. She denies any palpitations, shortness of breath, or peripheral edema. No lightheadedness, headaches, syncope, orthopnea, or PND.  Past Medical History:  Diagnosis Date   AMI (acute myocardial infarction) ([place]) 2007   failed PCI to the distal LAD - managed medically   Chronic pain    Complication of anesthesia    CVA (cerebral vascular accident) ([place]) 1999   Degenerative disc disease, cervical    Hypothyroidism    PONV (postoperative nausea and vomiting)    Unstable angina ([place])      Past Surgical History:  Procedure Laterality Date   COLONOSCOPY WITH PROPOFOL N/A 05/24/2018   normal TI, significant looping of the colon, and external hemorrhoids.   ENDOMETRIAL ABLATION  3/06   left ankle surgery     OPEN REDUCTION INTERNAL FIXATION (ORIF) DISTAL RADIAL FRACTURE Left 07/23/2020   Procedure: 1.  Removal left distal radius volar plate 2.  Open reduction internal fixation left extra-articular radial shaft fracture 3.  Closed treatment distal ulnar shaft fracture ;  Surgeon: Leanora Cover, MD;  Location: [facility];  Service: Orthopedics;  Laterality: Left;    Current Medications: Current Meds  Medication Sig   AbobotulinumtoxinA (DYSPORT) 500 units SOLR injection Inject 1,500 Units into the muscle every 3 (three) months.   ALPRAZolam (XANAX) 0.5 MG tablet Take 0.5 mg by mouth at bedtime.   aspirin 81 MG tablet Take 81 mg by mouth daily.   cetirizine (ZYRTEC) 10 MG tablet Take 10 mg by mouth daily as needed for allergies.   Cholecalciferol (VITAMIN D3) 1000 units CAPS Take 1,000 Units by mouth daily.   clopidogrel (PLAVIX) 75 MG tablet Take 1 tablet (75 mg total) by mouth daily.   DENTA 5000 PLUS 1.1 % CREA dental cream Place 1 application onto teeth at bedtime.    diclofenac Sodium (VOLTAREN) 1 % GEL Apply 4 g topically 3 (three) times daily as needed (pain).  diphenhydrAMINE (BENADRYL) 12.5 MG/5ML liquid Take 12.5 mg by mouth 4 (four) times daily as needed for allergies.   DULoxetine (CYMBALTA) 60 MG capsule TAKE ONE CAPSULE BY MOUTH EVERY DAY (Patient taking differently: Take 60 mg by mouth at bedtime.)   gabapentin (NEURONTIN) 600 MG tablet Take 600 mg by mouth 2 (two) times daily.   guaiFENesin (MUCINEX) 600 MG 12 hr tablet Take 600 mg by mouth 2 (two) times daily as needed for cough or to loosen phlegm.   hydrocortisone (ANUSOL-HC) 2.5 % rectal cream Place 1 application. rectally 2 (two) times daily. For rectal itching, burning, bleeding   ibandronate (BONIVA) 150 MG  tablet Take 150 mg by mouth every 30 (thirty) days. Take in the morning with a full glass of water, on an empty stomach, and do not take anything else by mouth or lie down for the next 30 min.   levothyroxine (SYNTHROID, LEVOTHROID) 75 MCG tablet Take 75 mcg by mouth daily before breakfast.   lubiprostone (AMITIZA) 24 MCG capsule Take 1 capsule (24 mcg total) by mouth 2 (two) times daily with a meal.   Menthol 5 % PTCH Apply 1 patch topically at bedtime.   nitroGLYCERIN (NITROSTAT) 0.4 MG SL tablet Place 0.4 mg under the tongue every 5 (five) minutes as needed for chest pain.   NUCYNTA 50 MG tablet Take 50 mg by mouth in the morning, at noon, and at bedtime.   NUCYNTA ER 50 MG 12 hr tablet Take 50 mg by mouth every 12 (twelve) hours.    pantoprazole (PROTONIX) 40 MG tablet Take 40 mg by mouth daily as needed (for acid reflux).   ranolazine (RANEXA) 500 MG 12 hr tablet TAKE 1 TABLET DAILY   rOPINIRole (REQUIP) 2 MG tablet Take 2 mg by mouth at bedtime.   sennosides-docusate sodium (SENOKOT-S) 8.6-50 MG tablet Take 2 tablets by mouth daily. As needed   Study - ORION 4 - inclisiran 300 mg/1.51mL or placebo SQ injection (PI-Stuckey) Inject 300 mg into the skin every 6 (six) months.   Study - ORION 4 - inclisiran 300 mg/1.20mL or placebo SQ injection (PI-Stuckey) Inject 1.5 mLs (300 mg total) into the skin every 6 (six) months.   Current Facility-Administered Medications for the 05/13/22 encounter (Office Visit) with Skeet Latch, MD  Medication   AbobotulinumtoxinA (DYSPORT) 500 units injection 500 Units     Allergies:   Morphine, Rosuvastatin, Zolpidem tartrate, and Penicillins   Social History   Socioeconomic History   Marital status: Married    Spouse name: Not on file   Number of children: Not on file   Years of education: Not on file   Highest education level: Not on file  Occupational History   Occupation: disabled  Tobacco Use   Smoking status: Former   Smokeless tobacco: Never    Tobacco comments:    Quit in 2005  Vaping Use   Vaping Use: Never used  Substance and Sexual Activity   Alcohol use: Yes    Comment: social   Drug use: No   Sexual activity: Not Currently  Other Topics Concern   Not on file  Social History Narrative   Married, no children   Right handed   12th grade   1 cup daily   Social Determinants of Health   Financial Resource Strain: Not on file  Food Insecurity: Not on file  Transportation Needs: Not on file  Physical Activity: Not on file  Stress: Not on file  Social Connections: Not on  file     Family History: The patient's family history includes Arrhythmia in her father. There is no history of Colon cancer.  ROS:   Please see the history of present illness.    (+) Occasional chest discomfort   All other systems reviewed and are negative.  EKGs/Labs/Other Studies Reviewed:    The following studies were reviewed today:  Bilateral Carotid Doppler 03/19/2021: Summary:  Right Carotid: Velocities in the right ICA are consistent with a 1-39%  stenosis.   Left Carotid: Velocities in the left ICA are consistent with a 1-39%  stenosis.   Vertebrals:  Bilateral vertebral arteries demonstrate antegrade flow.  Subclavians: Normal flow hemodynamics were seen in bilateral subclavian arteries.    ABI 07/16/2020: Right: Resting right ankle-brachial index is within normal range. No  evidence of significant right lower extremity arterial disease. The right  toe-brachial index is normal.   Left: Resting left ankle-brachial index is within normal range. No  evidence of significant left lower extremity arterial disease. TBIs are  unreliable.   EKG:  EKG is personally reviewed. 05/13/22: EKG was not ordered. 01/03/2021: NSR. LAFB. rate 69 bpm, borderline LVH.  Recent Labs: No results found for requested labs within last 365 days.  Recent Lipid Panel    Component Value Date/Time   CHOL 155 04/23/2020 1105   TRIG 97 04/23/2020  1105   HDL 61 04/23/2020 1105   CHOLHDL 2.5 04/23/2020 1105   CHOLHDL 3 09/10/2009 0000   VLDL 22.2 09/10/2009 0000   LDLCALC 76 04/23/2020 1105     Physical Exam:    VS:  BP 122/74   Pulse 85   Ht 5\' 2"  (1.575 m)   Wt 142 lb (64.4 kg)   BMI 25.97 kg/m     Wt Readings from Last 3 Encounters:  05/13/22 142 lb (64.4 kg)  03/23/22 138 lb (62.6 kg)  12/03/21 138 lb (62.6 kg)     GEN: Well nourished, well developed in no acute distress HEENT: Normal NECK: No JVD; No carotid bruits LYMPHATICS: No lymphadenopathy CARDIAC: RRR, no murmurs, rubs, gallops RESPIRATORY:  Clear to auscultation without rales, wheezing or rhonchi  ABDOMEN: Soft, non-tender, non-distended MUSCULOSKELETAL:  No LE edema; No deformity  SKIN: Warm and dry NEUROLOGIC:  Alert and oriented x 3.  L hemiplegia PSYCHIATRIC:  Normal affect   ASSESSMENT:    1. Atherosclerosis of native coronary artery of native heart without angina pectoris   2. Cerebral artery occlusion with cerebral infarction ([place])   3. Pure hypercholesterolemia     PLAN:    CAD, NATIVE VESSEL Status post MI.  Medically managed as she was unable to have PCI.  She is doing well and has no angina.  She is congratulated on her regular exercise.  Continue aspirin, clopidogrel, and ranolazine.  She is in the [place] 4 study of [place].  Her study ends in March.  After that we will need to reassess her lipids and start her on lipid therapy.  She does not tolerate statins.  Cerebral artery occlusion with cerebral infarction Residual left hemiplegia.  Blood pressures well controlled.  Continue aspirin, clopidogrel, and study lipid meds.  Pure hypercholesterolemia She recently had lab work with her PCP.  She thinks that lipids were checked at that time.  We will get a copy.  She remains in the [place] 4 study as above.   Medication Adjustments/Labs and Tests Ordered: Current medicines are reviewed at length with the patient today.  Concerns  regarding medicines are  outlined above.  No orders of the defined types were placed in this encounter.  No orders of the defined types were placed in this encounter.  Patient Instructions  Medication Instructions:  Your Physician recommend you continue on your current medication as directed.    *If you need a refill on your cardiac medications before your next appointment, please call your pharmacy*  Follow-Up: At [facility], you and your health needs are our priority.  As part of our continuing mission to provide you with exceptional heart care, we have created designated Provider Care Teams.  These Care Teams include your primary Cardiologist (physician) and Advanced Practice Providers (APPs -  Physician Assistants and Nurse Practitioners) who all work together to provide you with the care you need, when you need it.  We recommend signing up for the patient portal called "MyChart".  Sign up information is provided on this After Visit Summary.  MyChart is used to connect with patients for Virtual Visits (Telemedicine).  Patients are able to view lab/test results, encounter notes, upcoming appointments, etc.  Non-urgent messages can be sent to your provider as well.   To learn more about what you can do with MyChart, go to [email or web address].    Your next appointment:   Follow up in 6 months with Dr. Oval Linsey- We will mail you a letter when it is time to schedule.         Disposition: Follow-up with Lakresha Stifter C. Oval Linsey, MD, [facility] in 6 months.  I,Breanna Adamick,acting as a scribe for Skeet Latch, MD.,have documented all relevant documentation on the behalf of Skeet Latch, MD,as directed by  Skeet Latch, MD while in the presence of Skeet Latch, MD.   I, [place] Oval Linsey, MD have reviewed all documentation for this visit.  The documentation of the exam, diagnosis, procedures, and orders on 05/13/2022 are all accurate and complete.   Signed, Skeet Latch, MD  05/13/2022 8:28 AM    [place]

## 2022-05-13 NOTE — Assessment & Plan Note (Signed)
Status post MI.  Medically managed as she was unable to have PCI.  She is doing well and has no angina.  She is congratulated on her regular exercise.  Continue aspirin, clopidogrel, and ranolazine.  She is in the [place] 4 study of [place].  Her study ends in March.  After that we will need to reassess her lipids and start her on lipid therapy.  She does not tolerate statins.

## 2022-06-02 ENCOUNTER — Telehealth: Payer: Self-pay

## 2022-06-02 ENCOUNTER — Telehealth: Payer: Self-pay | Admitting: Gastroenterology

## 2022-06-02 ENCOUNTER — Ambulatory Visit (INDEPENDENT_AMBULATORY_CARE_PROVIDER_SITE_OTHER): Payer: Medicare HMO | Admitting: Gastroenterology

## 2022-06-02 ENCOUNTER — Encounter: Payer: Self-pay | Admitting: Gastroenterology

## 2022-06-02 VITALS — Ht 62.0 in | Wt 136.0 lb

## 2022-06-02 DIAGNOSIS — K5909 Other constipation: Secondary | ICD-10-CM

## 2022-06-02 MED ORDER — LUBIPROSTONE 24 MCG PO CAPS: 24.0000 ug | ORAL_CAPSULE | Freq: Two times a day (BID) | ORAL | 3 refills | Status: DC

## 2022-06-02 NOTE — Telephone Encounter (Signed)
Dena:  Can we check with the pharmacy and see what is going on with Amitiza? Letter is on your desk. Thank you!

## 2022-06-02 NOTE — Patient Instructions (Signed)
I will see you back in 1 year!!   Continue Amitiza as you are doing.   Please call with any concerns!  I enjoyed talking with you again today! As you know, I value our relationship and want to provide genuine, compassionate, and quality care. I welcome your feedback. If you receive a survey regarding your visit,  I greatly appreciate you taking time to fill this out. See you next time!  Annitta Needs, PhD, ANP-BC [facility]

## 2022-06-02 NOTE — Telephone Encounter (Signed)
error 

## 2022-06-02 NOTE — Progress Notes (Signed)
Primary Care Physician:  Lianne Moris, PA-C  Primary GI: Dr. Marletta Lor  Patient Location: Home   Provider Location: [facility] office   Reason for Visit: Follow-up   Persons present on the virtual encounter, with roles: Patient, patient's husband, and NP   Total time (minutes) spent on medical discussion: 8 minutes   Due to COVID-19, visit was conducted using virtual method.  Visit was requested by patient.  Virtual Visit via Telephone Note Due to COVID-19, visit is conducted virtually and was requested by patient.   I connected with [name] on 06/02/22 at 11:30 AM EDT by video and verified that I am speaking with the correct person using two identifiers.   I discussed the limitations, risks, security and privacy concerns of performing an evaluation and management service by telephone and the availability of in person appointments. I also discussed with the patient that there may be a patient responsible charge related to this service. The patient expressed understanding and agreed to proceed.  Chief Complaint  Patient presents with   Follow-up    Pt needs refills on her medications     History of Present Illness: Very pleasant 71 year old female presenting today via telephone visit in follow-up for OIC. Previously failing Linzess and Movantik. On Amitiza 24 mcg BID.   Occasional constipation but overall doing well on Amitiza.  Takes Senokot 2 tablets daily. She has concerns about Amitiza being covered by insurance. She has sent via email documentation from insurance stating something regarding other alternatives. We will need to look into this.   Pantoprazole just as needed. No dysphagia.   Sometimes small volume rectal bleeding if hard stool. Overall, she feels well.    Last colonoscopy Oct 2019: normal TI, significant looping of colon, external hemorrhoids.     Past Medical History:  Diagnosis Date   AMI (acute myocardial infarction) (HCC) 2007   failed PCI to the  distal LAD - managed medically   Chronic pain    Complication of anesthesia    CVA (cerebral vascular accident) (HCC) 1999   Degenerative disc disease, cervical    Hypothyroidism    PONV (postoperative nausea and vomiting)    Unstable angina (HCC)      Past Surgical History:  Procedure Laterality Date   COLONOSCOPY WITH PROPOFOL N/A 05/24/2018   normal TI, significant looping of the colon, and external hemorrhoids.   ENDOMETRIAL ABLATION  3/06   left ankle surgery     OPEN REDUCTION INTERNAL FIXATION (ORIF) DISTAL RADIAL FRACTURE Left 07/23/2020   Procedure: 1.  Removal left distal radius volar plate 2.  Open reduction internal fixation left extra-articular radial shaft fracture 3.  Closed treatment distal ulnar shaft fracture ;  Surgeon: Betha Loa, MD;  Location: [facility] OR;  Service: Orthopedics;  Laterality: Left;     Current Meds  Medication Sig   AbobotulinumtoxinA (DYSPORT) 500 units SOLR injection Inject 1,500 Units into the muscle every 3 (three) months.   ALPRAZolam (XANAX) 0.5 MG tablet Take 0.5 mg by mouth at bedtime.   aspirin 81 MG tablet Take 81 mg by mouth daily.   cetirizine (ZYRTEC) 10 MG tablet Take 10 mg by mouth daily as needed for allergies.   Cholecalciferol (VITAMIN D3) 1000 units CAPS Take 1,000 Units by mouth daily.   clopidogrel (PLAVIX) 75 MG tablet Take 1 tablet (75 mg total) by mouth daily.   DENTA 5000 PLUS 1.1 % CREA dental cream Place 1 application onto teeth at bedtime.  diclofenac Sodium (VOLTAREN) 1 % GEL Apply 4 g topically 3 (three) times daily as needed (pain).   diphenhydrAMINE (BENADRYL) 12.5 MG/5ML liquid Take 12.5 mg by mouth 4 (four) times daily as needed for allergies.   DULoxetine (CYMBALTA) 60 MG capsule TAKE ONE CAPSULE BY MOUTH EVERY DAY (Patient taking differently: Take 60 mg by mouth at bedtime.)   gabapentin (NEURONTIN) 600 MG tablet Take 600 mg by mouth 2 (two) times daily.   hydrocortisone (ANUSOL-HC) 2.5 % rectal cream Place 1  application. rectally 2 (two) times daily. For rectal itching, burning, bleeding   ibandronate (BONIVA) 150 MG tablet Take 150 mg by mouth every 30 (thirty) days. Take in the morning with a full glass of water, on an empty stomach, and do not take anything else by mouth or lie down for the next 30 min.   levothyroxine (SYNTHROID, LEVOTHROID) 75 MCG tablet Take 75 mcg by mouth daily before breakfast.   nitroGLYCERIN (NITROSTAT) 0.4 MG SL tablet Place 0.4 mg under the tongue every 5 (five) minutes as needed for chest pain.   NUCYNTA 50 MG tablet Take 50 mg by mouth in the morning, at noon, and at bedtime.   NUCYNTA ER 50 MG 12 hr tablet Take 50 mg by mouth every 12 (twelve) hours.    pantoprazole (PROTONIX) 40 MG tablet Take 40 mg by mouth daily as needed (for acid reflux).   ranolazine (RANEXA) 500 MG 12 hr tablet TAKE 1 TABLET DAILY   rOPINIRole (REQUIP) 2 MG tablet Take 2 mg by mouth at bedtime.   sennosides-docusate sodium (SENOKOT-S) 8.6-50 MG tablet Take 2 tablets by mouth daily. As needed   Study - ORION 4 - inclisiran 300 mg/1.47mL or placebo SQ injection (PI-Stuckey) Inject 300 mg into the skin every 6 (six) months.   Study - ORION 4 - inclisiran 300 mg/1.93mL or placebo SQ injection (PI-Stuckey) Inject 1.5 mLs (300 mg total) into the skin every 6 (six) months.   [DISCONTINUED] lubiprostone (AMITIZA) 24 MCG capsule Take 1 capsule (24 mcg total) by mouth 2 (two) times daily with a meal.   Current Facility-Administered Medications for the 06/02/22 encounter (Office Visit) with Gelene Mink, NP  Medication   AbobotulinumtoxinA (DYSPORT) 500 units injection 500 Units     Family History  Problem Relation Age of Onset   Arrhythmia Father        PPM   Colon cancer Neg Hx     Social History   Socioeconomic History   Marital status: Married    Spouse name: Not on file   Number of children: Not on file   Years of education: Not on file   Highest education level: Not on file   Occupational History   Occupation: disabled  Tobacco Use   Smoking status: Former   Smokeless tobacco: Never   Tobacco comments:    Quit in 2005  Vaping Use   Vaping Use: Never used  Substance and Sexual Activity   Alcohol use: Yes    Comment: social   Drug use: No   Sexual activity: Not Currently  Other Topics Concern   Not on file  Social History Narrative   Married, no children   Right handed   12th grade   1 cup daily   Social Determinants of Health   Financial Resource Strain: Not on file  Food Insecurity: Not on file  Transportation Needs: Not on file  Physical Activity: Not on file  Stress: Not on file  Social Connections:  Not on file       Review of Systems: Gen: Denies fever, chills, anorexia. Denies fatigue, weakness, weight loss.  CV: Denies chest pain, palpitations, syncope, peripheral edema, and claudication. Resp: Denies dyspnea at rest, cough, wheezing, coughing up blood, and pleurisy. GI: see HPI Derm: Denies rash, itching, dry skin Psych: Denies depression, anxiety, memory loss, confusion. No homicidal or suicidal ideation.  Heme: Denies bruising, bleeding, and enlarged lymph nodes.  Observations/Objective: No distress. Unable to perform physical exam due to telephone encounter.   Assessment and Plan: 71 year old female presenting today via telephone visit in follow-up for OIC and GERD.  OIC: doing well on Amitiza BID.  Previously failing Linzess and Movantik. Senokot as well. No concerns. Scant volume bleeding noted if straining or hard stool; colonoscopy on file from 2019.   GERD: pantoprazole just as needed.  She may need a PA completed for Amitiza. We will reach out to pharmacy and see status of this.  Return in 1 year     Follow Up Instructions:    I discussed the assessment and treatment plan with the patient. The patient was provided an opportunity to ask questions and all were answered. The patient agreed with the plan and  demonstrated an understanding of the instructions.   The patient was advised to call back or seek an in-person evaluation if the symptoms worsen or if the condition fails to improve as anticipated.  I provided 8 minutes of telephone time during this encounter.  Annitta Needs, PhD, ANP-BC [facility]

## 2022-06-02 NOTE — Telephone Encounter (Signed)
noted 

## 2022-06-03 NOTE — Telephone Encounter (Signed)
Pt approved for Lubiprostone Cap. (Non-formulary). Approved from 08/03/2021-08/03/2023. Pt advised

## 2022-06-03 NOTE — Telephone Encounter (Signed)
Noted  

## 2022-06-03 NOTE — Telephone Encounter (Signed)
PA done for Lubiprostone Cap 24 MCG. (On pt's non-formulary list). Tried/failed: Latulose, Linzess 145 mcg, Miralax, and Movantik. Waiting on response from Cover My Meds.

## 2022-06-03 NOTE — Telephone Encounter (Signed)
I phoned to [name] and was advised that [name] wasn't on her Formulary. Do you want me to do a PA because pt has failed/tried others. Please advise

## 2022-06-03 NOTE — Telephone Encounter (Signed)
Yes, please. Thanks!

## 2022-06-08 ENCOUNTER — Telehealth: Payer: Self-pay | Admitting: Neurology

## 2022-06-08 NOTE — Telephone Encounter (Signed)
Renewal PA needed for Dysport 1500 units total, DX I69.354 electrical stimulation used for injections.

## 2022-06-08 NOTE — Telephone Encounter (Signed)
Pt will need new prior authorization for dysport. Pt is scheduled for dysport injection on 11/14

## 2022-06-09 ENCOUNTER — Other Ambulatory Visit (HOSPITAL_COMMUNITY): Payer: Self-pay

## 2022-06-09 NOTE — Telephone Encounter (Signed)
Patient Advocate Encounter   Received notification that prior authorization for Dysport 500UNIT solution is required.   PA submitted on 06/09/2022 Key [id number] Status is pending       Lyndel Safe, [place] Patient Advocate Specialist [facility] Patient Advocate Team Direct Number: [phone]  Fax: [phone]

## 2022-06-11 ENCOUNTER — Other Ambulatory Visit (HOSPITAL_COMMUNITY): Payer: Self-pay

## 2022-06-11 NOTE — Telephone Encounter (Signed)
Pt scheduled for Dysport 06/16/22.

## 2022-06-11 NOTE — Telephone Encounter (Signed)
Patient Advocate Encounter  Prior Authorization for Dysport 500UNIT solution has been approved.    PA# [id number] Effective dates: 06/09/2022 through 06/10/2023  Buy and [place]ero Duck, CPhT Pharmacy Patient Advocate Specialist [facility] Patient Advocate Team Direct Number: [phone]  Fax: [phone]

## 2022-06-16 ENCOUNTER — Encounter: Payer: Self-pay | Admitting: Neurology

## 2022-06-16 ENCOUNTER — Ambulatory Visit: Payer: Medicare HMO | Admitting: Neurology

## 2022-06-16 VITALS — BP 125/70 | HR 78 | Ht 62.0 in | Wt 136.0 lb

## 2022-06-16 DIAGNOSIS — I69354 Hemiplegia and hemiparesis following cerebral infarction affecting left non-dominant side: Secondary | ICD-10-CM | POA: Diagnosis not present

## 2022-06-16 MED ORDER — ABOBOTULINUMTOXINA 500 UNITS IM SOLR
1500.0000 [IU] | Freq: Once | INTRAMUSCULAR | Status: AC
  Administered 2022-06-16: 1500 [IU] via INTRAMUSCULAR

## 2022-06-16 MED ORDER — ABOBOTULINUMTOXINA 500 UNITS IM SOLR: 500.0000 [IU] | Freq: Once | INTRAMUSCULAR | Status: DC

## 2022-06-16 NOTE — Progress Notes (Signed)
  [facility]  CC:  Spastic hemiplegia  [name] is a 71  y.o. Right-handed Caucasian female, return for EMG guided Botox injection for her left spastic hemiparesis,  She suffered stroke in 1999, with residual left spastic hemiparesis, upper extremity more than leg, left visual field cut  She previously was enrolled in Botox research study for spastic upper extremity at [facility] by Dr. Marya Fossa, received EMG guided Botox injection in 2010 for about 2 years, with good benefit, but has difficulty with insurance  She began to receive Botox injection through our clinic by Dr. Hosie Poisson since May 2015,150 units total to left upper extremity, which has been very helpful  She is return for continued EMG guided Botox injection, at baseline, she can ambulate with a left AFO, but without assistance,  Profound left upper extremity spasticity, achiness, chronic pain, persistent left wrist flexion, finger flexion,  She also has a history of coronary artery disease in 2007, taking Plavix, and Aggrenox, exercise regularly, has a Systems analyst  She began to receive botulism toxin injection for spastic left hemiparesis since November 2015, initially uses only 300 units, for both left upper and lower extremity, later with the exercise on left upper extremity only, Botox a was changed to Dysport 500 units x 3, per insurance requirement  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 Last Weight:  Wt Readings from Last 1 Encounters:  06/02/22 136 lb (61.7 kg)   Last Height:   Ht Readings from Last 1 Encounters:  06/02/22 5\' 2"  (1.575 m)   PHYSICAL EXAMINATOINS:    Motor: spastic  left hemiparesis,    left lower extremity moderate spasticity, left hip flexion 4, knee flexion 4, extension 5,  Profound left upper extremity spasticity, left shoulder adduction, internal rotation.left elbow flexion, pronation, with passive movement, she has full range of motion of left elbow, left wrist forceful flexion, well-healed left forearm volar surface scar, left hand stayed in Position, finger flexion,thumb in position, ,limited range of motion of left finger extension  Gait: Rising up from seated position by pushing on chair arm,spastic left hemi-circumferential gait.  Assessment and plan:   71 year old right-handed Caucasian female, status post stroke, with spastic left hemiparesis since 1999, responded very well to previous EMG guided botulism toxin injection, return to clinic for repeat injection  Under electric stimulation, 500 units of Dysport A x 3  =1500 units total  (500 units dysport was dissolved into 2.5cc of NS)  Left pronator teres 0.5cc x2= 1.0 cc Left flexor digitorum profundus  0.5ccx2=1.0 cc Left palmaris longus 0.5cc =0.5 cc Left brachialis  0.5 ccx2= 1.0 cc Left flexor digitorum superficialis 0.5 cc x2= 1.0 cc Left flexor carpi ulnar wrist 0.5 cc   Left pectoralis major 0.5ccx3=1.5 cc Left latissimus dorsi 0.5 cc    2000, M.D. Ph.D.  [facility] [address] [place], [place] Phone: [phone] Fax:      [phone]

## 2022-06-16 NOTE — Progress Notes (Signed)
Dysport 500 units x 3 vials ] Ndc [id number] [id number] Exp-12/01/2023 B/B

## 2022-07-22 ENCOUNTER — Telehealth: Payer: Self-pay | Admitting: Neurology

## 2022-07-29 NOTE — Telephone Encounter (Signed)
error 

## 2022-08-16 IMAGING — DX DG FOREARM 2V*L*
1 series · 1 of 1 positions shown · non-contrast
Comparison: None.

CLINICAL DATA: Status post fall.

EXAM:
LEFT FOREARM - 2 VIEW

[forearm ap]
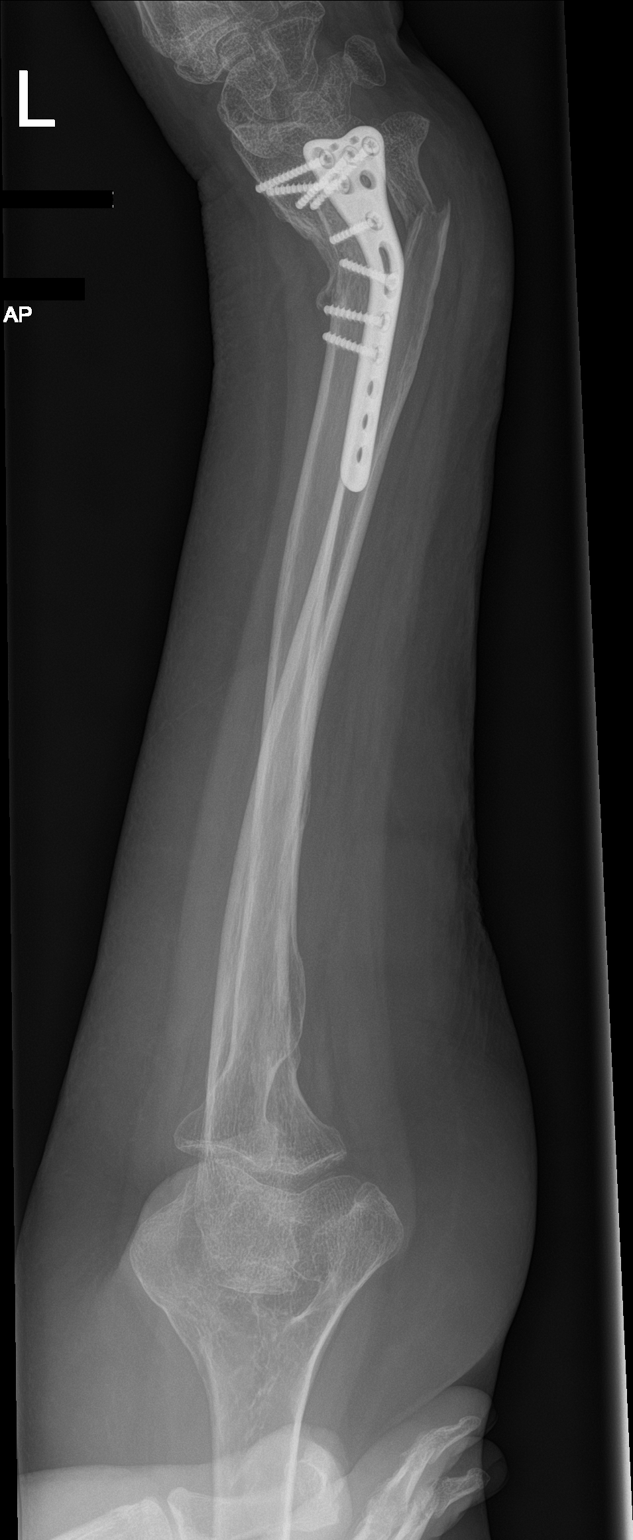

[1 of 1 positions shown; findings below may reference images not displayed]

FINDINGS: A radiopaque fixation plate and screws are seen along the distal
left radius. A mildly displaced fracture of the distal left ulna is
also noted. Dorsal angulation of the distal fracture sites is seen.
There is no evidence of dislocation. Moderate severity diffuse soft
tissue swelling is present.
IMPRESSION: 1. Status post open reduction and internal fixation of the distal
left radius.
2. Mildly displaced fracture of the distal left ulna.

## 2022-08-16 IMAGING — DX DG ELBOW COMPLETE 3+V*L*
4 series · 4 of 4 positions shown · non-contrast
Comparison: None.

CLINICAL DATA: Status post fall.

EXAM:
LEFT ELBOW - COMPLETE 3+ VIEW

[elbow ap (1 of 2)]
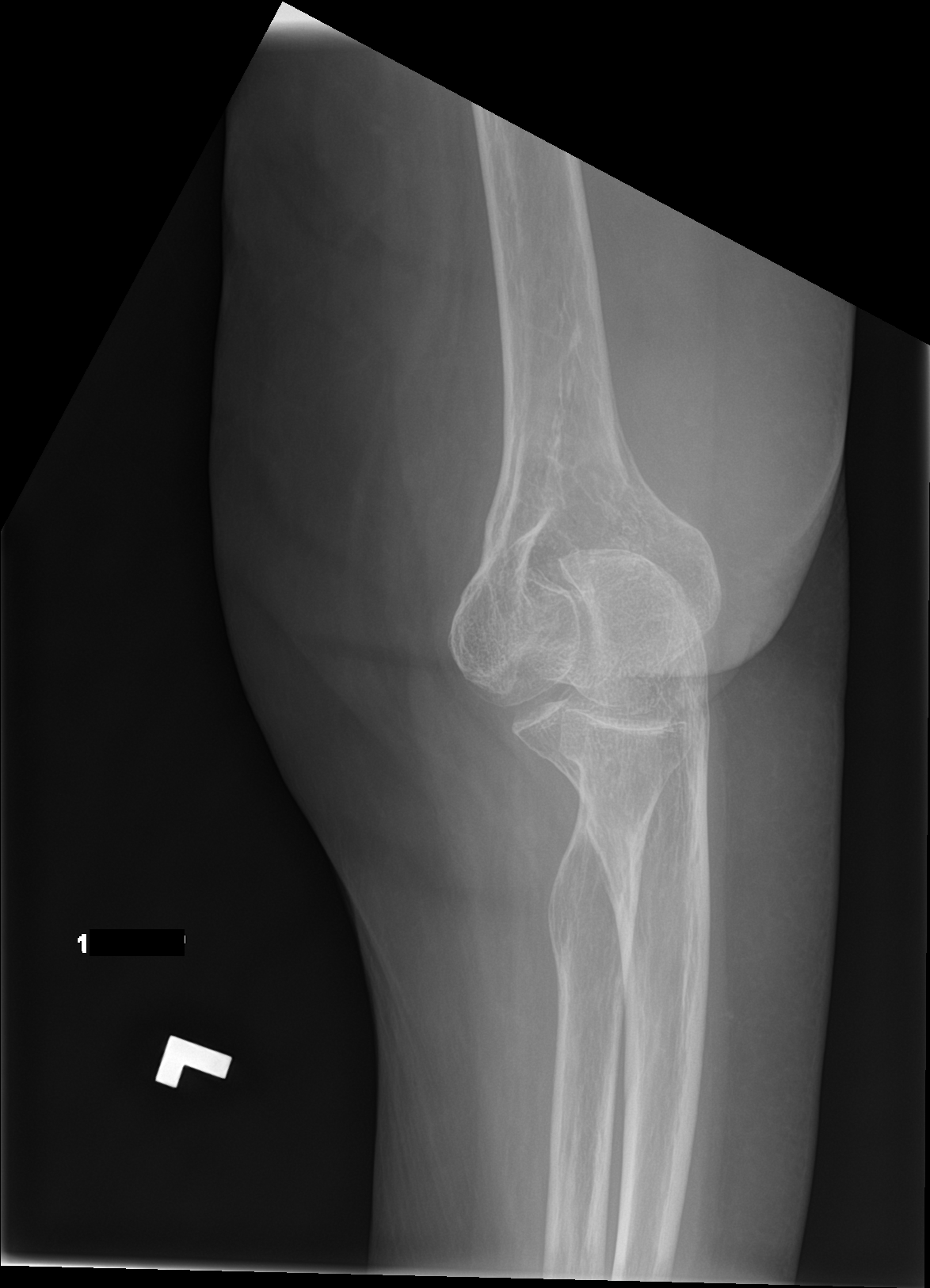

[elbow obl]
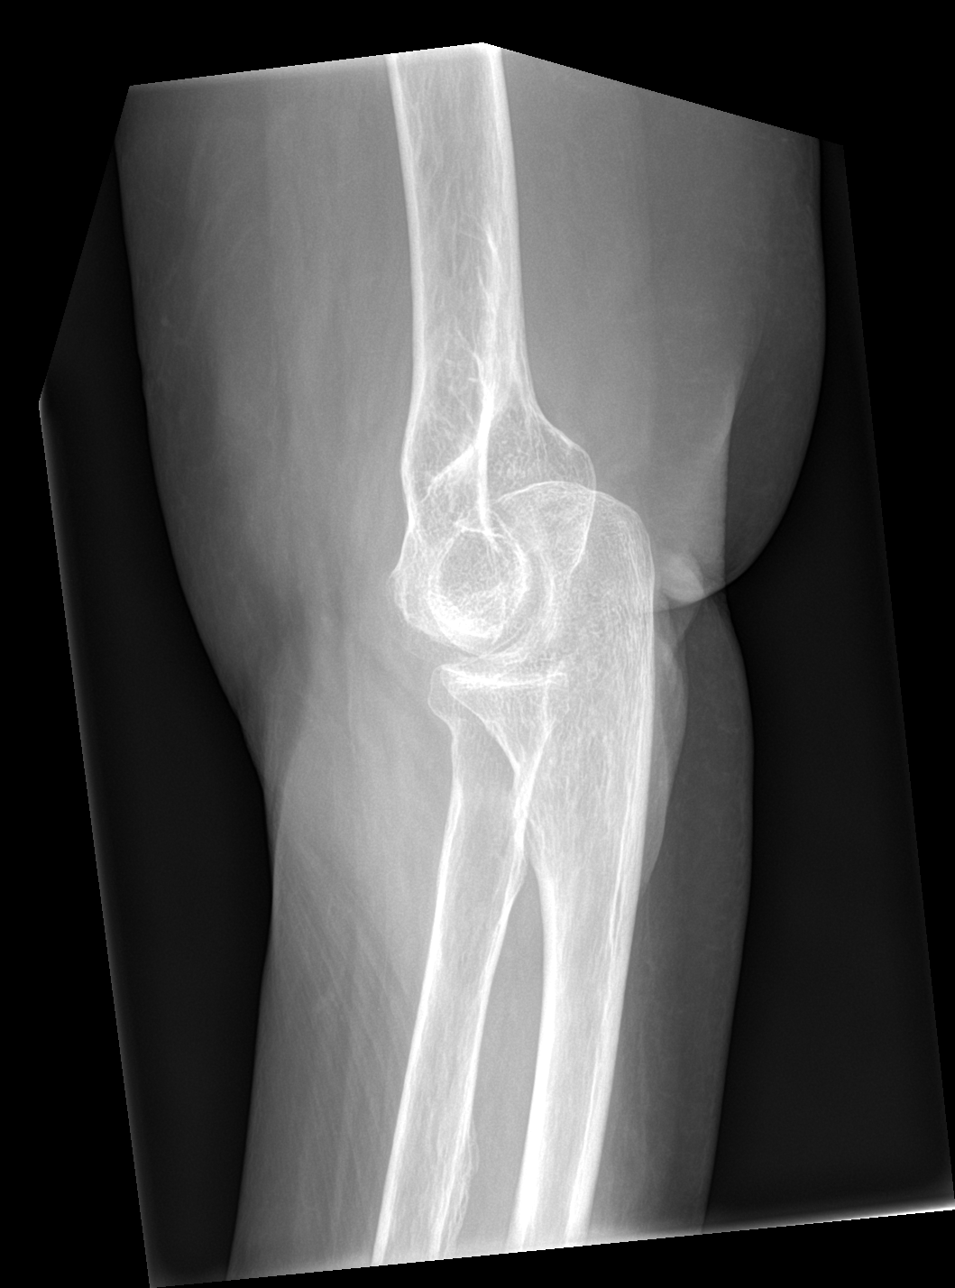

[elbow lat]
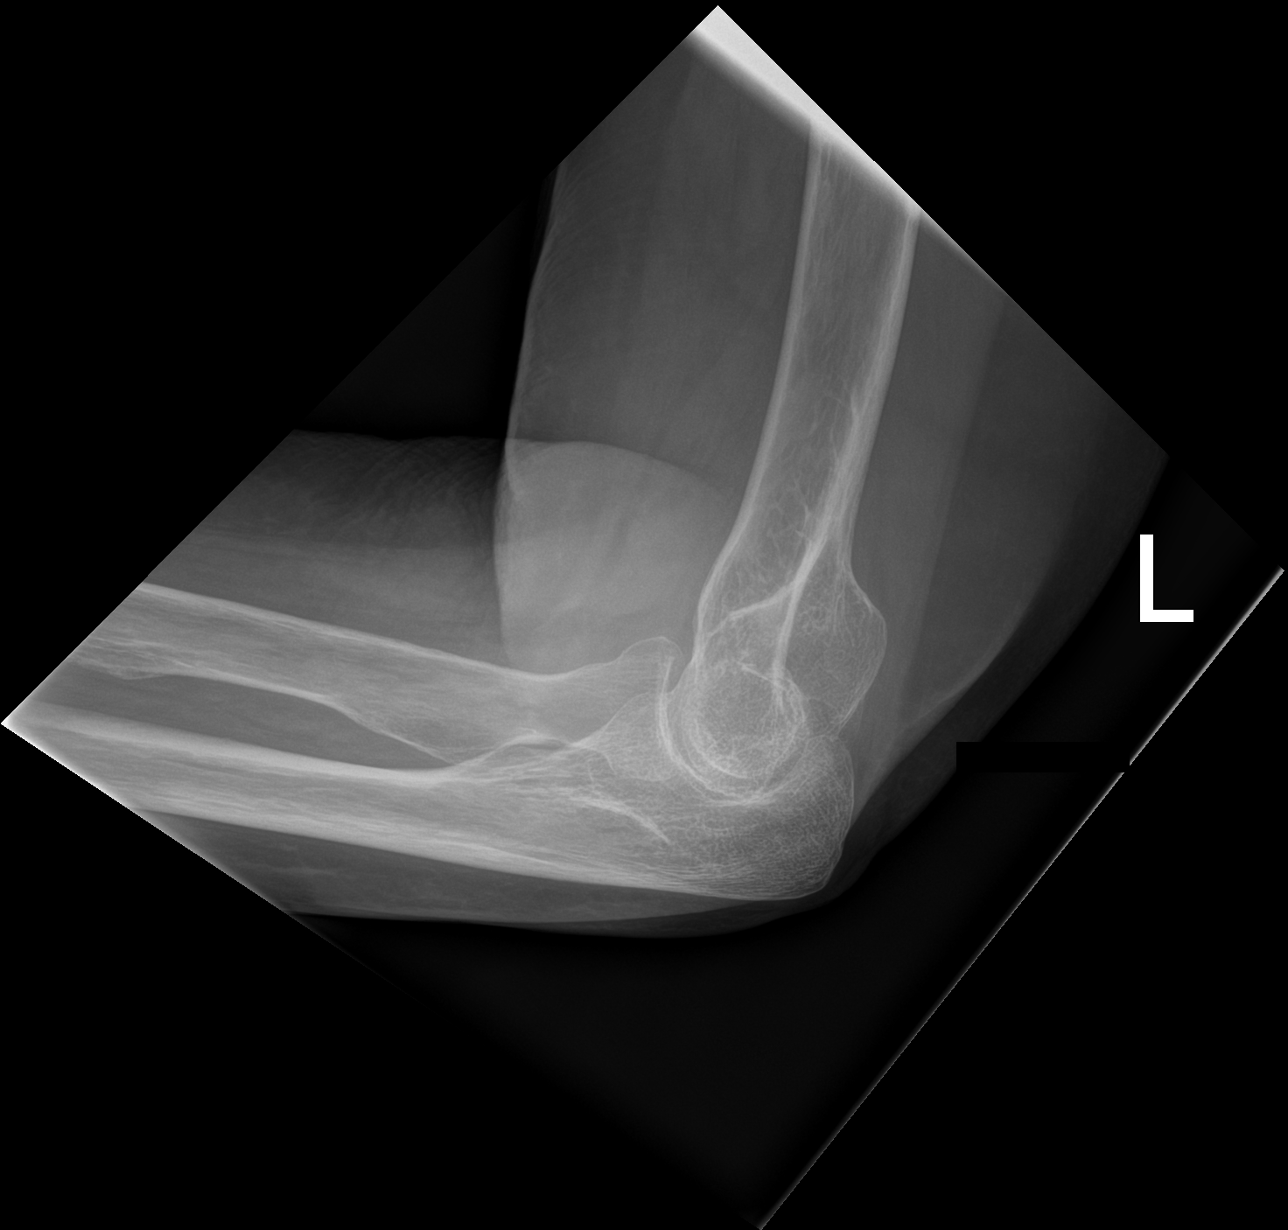

[elbow ap (2 of 2)]
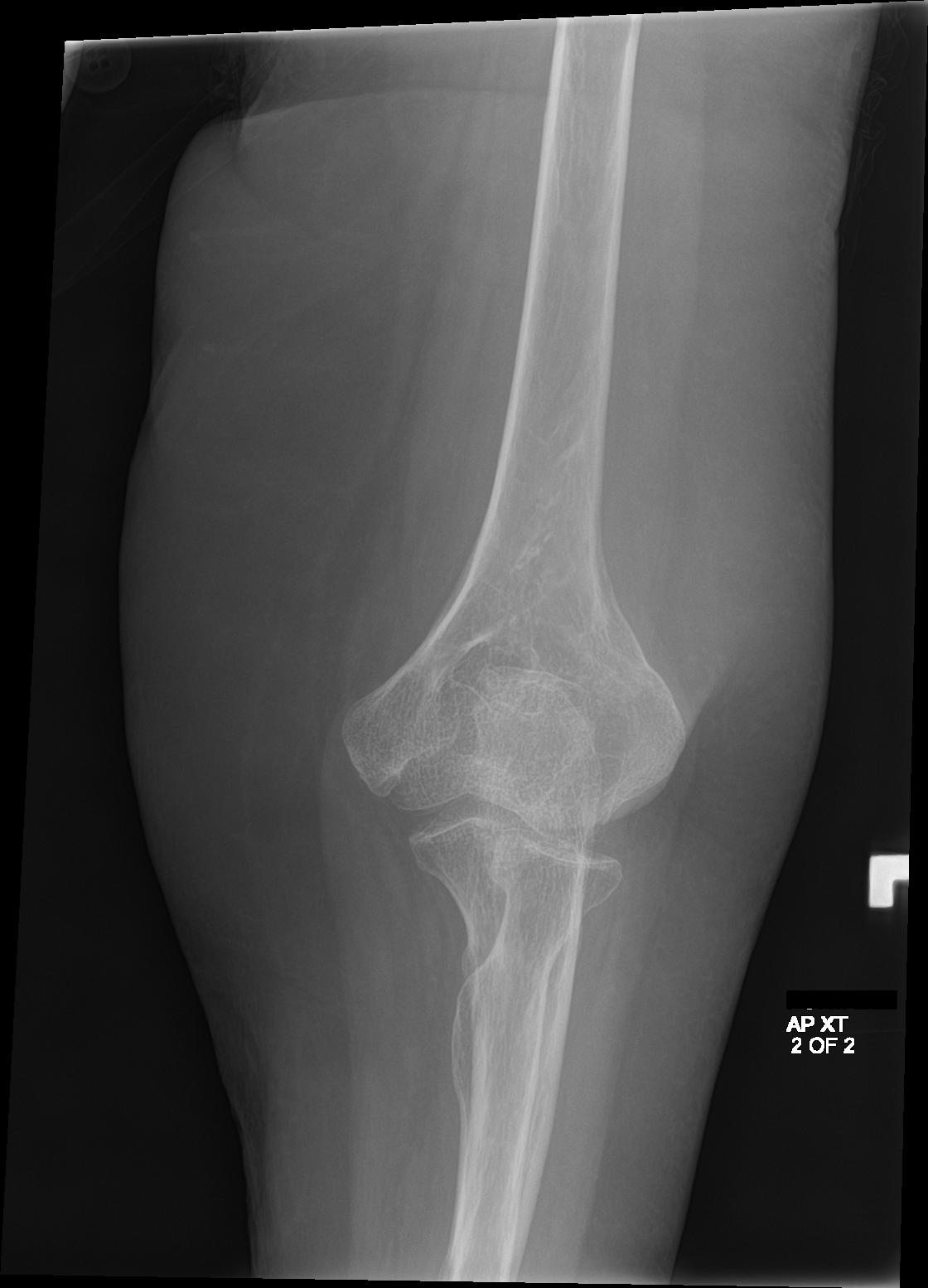

[4 of 4 positions shown; findings below may reference images not displayed]

FINDINGS: A chronic fracture deformity is seen involving the medial epicondyle
of the distal left humerus. There is no evidence of dislocation.
There is no evidence of arthropathy or other focal bone abnormality.
Soft tissues are unremarkable.
IMPRESSION: Chronic fracture of the medial epicondyle of the distal left
humerus.

## 2022-09-01 ENCOUNTER — Other Ambulatory Visit (HOSPITAL_BASED_OUTPATIENT_CLINIC_OR_DEPARTMENT_OTHER): Payer: Self-pay | Admitting: Cardiovascular Disease

## 2022-09-02 NOTE — Telephone Encounter (Signed)
Rx(s) sent to pharmacy electronically.  

## 2022-09-14 ENCOUNTER — Ambulatory Visit: Payer: Medicare HMO | Admitting: Neurology

## 2022-09-14 ENCOUNTER — Encounter: Payer: Self-pay | Admitting: Neurology

## 2022-09-14 VITALS — BP 119/74 | HR 75 | Ht 62.0 in | Wt 148.0 lb

## 2022-09-14 DIAGNOSIS — I69354 Hemiplegia and hemiparesis following cerebral infarction affecting left non-dominant side: Secondary | ICD-10-CM | POA: Diagnosis not present

## 2022-09-14 MED ORDER — DULOXETINE HCL 60 MG PO CPEP: 60.0000 mg | ORAL_CAPSULE | Freq: Every day | ORAL | 3 refills | Status: AC

## 2022-09-14 MED ORDER — DULOXETINE HCL 30 MG PO CPEP: 30.0000 mg | ORAL_CAPSULE | Freq: Every day | ORAL | 3 refills | Status: DC

## 2022-09-14 MED ORDER — ABOBOTULINUMTOXINA 500 UNITS IM SOLR
500.0000 [IU] | Freq: Once | INTRAMUSCULAR | Status: AC
  Administered 2022-09-14: 500 [IU] via INTRAMUSCULAR

## 2022-09-14 NOTE — Addendum Note (Signed)
Addended by: Marcial Pacas on: 09/14/2022 01:41 PM   Modules accepted: Orders

## 2022-09-14 NOTE — Progress Notes (Signed)
   Subjective:    Patient ID: [name], female    DOB: 31-Jan-1951, 72 y.o.   MRN: [id number]  HPI    Review of Systems     Objective:   Physical Exam        Assessment & Plan:

## 2022-09-14 NOTE — Progress Notes (Signed)
[facility]  CC:  Spastic hemiplegia  [name] is a 72  y.o. Right-handed Caucasian female, return for EMG guided Botox injection for her left spastic hemiparesis,  She suffered stroke in 1999, with residual left spastic hemiparesis, upper extremity more than leg, left visual field cut  She previously was enrolled in Botox research study for spastic upper extremity at [facility] by Dr. Maudry Mayhew, received EMG guided Botox injection in 2010 for about 2 years, with good benefit, but has difficulty with insurance  She began to receive Botox injection through our clinic by Dr. Janann Colonel since May 2015,150 units total to left upper extremity, which has been very helpful  She is return for continued EMG guided Botox injection, at baseline, she can ambulate with a left AFO, but without assistance,  Profound left upper extremity spasticity, achiness, chronic pain, persistent left wrist flexion, finger flexion,  She also has a history of coronary artery disease in 2007, taking Plavix, and Aggrenox, exercise regularly, has a Physiological scientist  She began to receive botulism toxin injection for spastic left hemiparesis since November 2015, initially uses only 300 units, for both left upper and lower extremity, later with the exercise on left upper extremity only, Botox a was changed to Dysport 500 units x 3, per insurance requirement  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, especially left shoulder, elbow, wrist, injection does help her  UPDATE March 23, 2022: She did well with previous injection, helped her shoulder elbow wrist deep achy pain, no significant side effect,  UPDATE Sep 14 2022:  Every 3 months Dysport injection for left upper extremity spasticity did help her spastic pain some, but she continues to have intermittent whole left side deep achy pain, consistent with post thalamic stroke neuropathic pain, Cymbalta 60 mg has helped her,  increased to 90 mg daily, also benefit her depression   Physical exam  Vitals: BP 119/74   Pulse 75   Ht 5\' 2"  (1.575 m)   Wt 148 lb (67.1 kg)   BMI 27.07 kg/m  Last Weight:  Wt Readings from Last 1 Encounters:  09/14/22 148 lb (67.1 kg)   Last Height:   Ht Readings from Last 1 Encounters:  09/14/22 5\' 2"  (1.575 m)   PHYSICAL EXAMINATOINS:    Motor: spastic left hemiparesis,    left lower extremity moderate spasticity, left hip flexion 4, knee flexion 4, extension 5,  Profound left upper extremity spasticity, left shoulder adduction, internal rotation.left elbow flexion, pronation, with passive movement, she has full range of motion of left elbow, left wrist forceful flexion, well-healed left forearm volar surface scar, left hand stayed in Position, finger flexion,thumb in position, ,limited range of motion of left finger extension  Gait: Rising up from seated position by pushing on chair arm,spastic left hemi-circumferential gait.  Assessment and plan:   72 year old right-handed Caucasian female, status post stroke, with spastic left hemiparesis since 1999, responded very well to previous EMG guided botulism toxin injection, return to clinic for repeat injection  Under electric stimulation, 500 units of Dysport A x 3  =1500 units total  (500 units dysport was dissolved into 2.5cc of NS)  Left pronator teres 0.5cc x2= 1.0 cc Left flexor digitorum profundus  0.5ccx2=1.0 cc Left palmaris longus 0.5cc =0.5 cc Left brachialis  0.5 ccx2= 1.0 cc Left flexor digitorum superficialis 0.5 cc  Left flexor carpi ulnar wrist 0.5 cc   Left pectoralis major 0.5ccx3=1.5 cc Left latissimus dorsi 0.5 ccx3=1.5cc  Marcial Pacas, M.D. Ph.D.  [facility] [place], [place] [address] Phone: [phone] Fax:      [phone]

## 2022-09-14 NOTE — Progress Notes (Signed)
Dysport 500 units x 1 vial  LOT #: [id number] NDC: [id number] EXP: 04/02/2024  B/B

## 2022-09-30 ENCOUNTER — Other Ambulatory Visit: Payer: Self-pay | Admitting: Gastroenterology

## 2022-09-30 ENCOUNTER — Other Ambulatory Visit: Payer: Self-pay | Admitting: Family Medicine

## 2022-09-30 DIAGNOSIS — Z1231 Encounter for screening mammogram for malignant neoplasm of breast: Secondary | ICD-10-CM

## 2022-10-06 ENCOUNTER — Telehealth: Payer: Self-pay

## 2022-10-06 MED ORDER — LUBIPROSTONE 24 MCG PO CAPS: 24.0000 ug | ORAL_CAPSULE | Freq: Two times a day (BID) | ORAL | 3 refills | Status: DC

## 2022-10-06 MED ORDER — HYDROCORTISONE (PERIANAL) 2.5 % EX CREA: 1.0000 | TOPICAL_CREAM | Freq: Two times a day (BID) | CUTANEOUS | 1 refills | Status: DC

## 2022-10-06 NOTE — Telephone Encounter (Signed)
I sent Amitiza to [facility] and Anusol to [facility]. Thanks!

## 2022-10-06 NOTE — Addendum Note (Signed)
Addended by: Annitta Needs on: 10/06/2022 12:40 PM   Modules accepted: Orders

## 2022-10-06 NOTE — Telephone Encounter (Signed)
Pt phoned advising that her Amitiza needs a refill. I spoke with her X2 advising that she has enough according to the Rx @ [facility] but she states that [facility] Rx just sitting there on hold. Looks like she has it @ 2 different pharmacies and [facility] is the pharmacy she needs it sent to. She also stated that inbetween our phone calls this morning she has developed hemorrhoids that are bleeding and wants a Rx sent to her pharmacy. This is also @ [facility] which I just advised her she can have them refill and she can pick up.

## 2022-10-07 NOTE — Telephone Encounter (Signed)
Advised the pt of both Rx's being sent to both of her pharmacies

## 2022-10-20 ENCOUNTER — Encounter: Payer: Medicare HMO | Admitting: *Deleted

## 2022-10-20 DIAGNOSIS — Z006 Encounter for examination for normal comparison and control in clinical research program: Secondary | ICD-10-CM

## 2022-10-20 MED ORDER — STUDY - ORION 4 - INCLISIRAN 300 MG/1.5 ML OR PLACEBO SQ INJECTION (PI-STUCKEY)
300.0000 mg | INJECTION | SUBCUTANEOUS | Status: DC
  Administered 2022-10-20: 300 mg via SUBCUTANEOUS
  Filled 2022-10-20: qty 1.5

## 2022-10-20 NOTE — Research (Signed)
[facility] visit  Patient here with husband  Doing well  No complaints or AE/SEA to report Labs drawn today  Injection given RUQ @ [id number] Next appt 04/20/2023 @ 10am   Philemon Kingdom :) RN BSN  Clinical Research Nurse  Be strong and take heart, all you who hope in the [place]. ~ Psalm 31:24

## 2022-11-20 ENCOUNTER — Ambulatory Visit: Payer: Medicare HMO

## 2022-12-04 ENCOUNTER — Ambulatory Visit
Admission: RE | Admit: 2022-12-04 | Discharge: 2022-12-04 | Disposition: A | Discharge status: Home / Self Care | Payer: Medicare HMO | Source: Ambulatory Visit | Attending: Family Medicine | Admitting: Family Medicine

## 2022-12-04 DIAGNOSIS — Z1231 Encounter for screening mammogram for malignant neoplasm of breast: Secondary | ICD-10-CM

## 2022-12-09 ENCOUNTER — Ambulatory Visit: Payer: Medicare HMO | Admitting: Neurology

## 2022-12-15 ENCOUNTER — Encounter: Payer: Self-pay | Admitting: Neurology

## 2022-12-15 ENCOUNTER — Ambulatory Visit: Payer: Medicare HMO | Admitting: Neurology

## 2022-12-15 VITALS — BP 123/76 | HR 72 | Ht 62.0 in | Wt 140.0 lb

## 2022-12-15 DIAGNOSIS — I69354 Hemiplegia and hemiparesis following cerebral infarction affecting left non-dominant side: Secondary | ICD-10-CM

## 2022-12-15 MED ORDER — ABOBOTULINUMTOXINA 500 UNITS IM SOLR
500.0000 [IU] | INTRAMUSCULAR | Status: DC
  Administered 2022-12-16 – 2023-12-15 (×2): 500 [IU] via INTRAMUSCULAR

## 2022-12-15 NOTE — Progress Notes (Unsigned)
[facility]  CC:  Spastic hemiplegia  [name] is a 72  y.o. Right-handed Caucasian female, return for EMG guided Botox injection for her left spastic hemiparesis,  She suffered stroke in 1999, with residual left spastic hemiparesis, upper extremity more than leg, left visual field cut  She previously was enrolled in Botox research study for spastic upper extremity at [facility] by Dr. Marya Fossa, received EMG guided Botox injection in 2010 for about 2 years, with good benefit, but has difficulty with insurance  She began to receive Botox injection through our clinic by Dr. Hosie Poisson since May 2015,150 units total to left upper extremity, which has been very helpful  She is return for continued EMG guided Botox injection, at baseline, she can ambulate with a left AFO, but without assistance,  Profound left upper extremity spasticity, achiness, chronic pain, persistent left wrist flexion, finger flexion,  She also has a history of coronary artery disease in 2007, taking Plavix, and Aggrenox, exercise regularly, has a Systems analyst  She began to receive botulism toxin injection for spastic left hemiparesis since November 2015, initially uses only 300 units, for both left upper and lower extremity, later with the exercise on left upper extremity only, Botox a was changed to Dysport 500 units x 3, per insurance requirement  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, especially left shoulder, elbow, wrist, injection does help her  UPDATE March 23, 2022: She did well with previous injection, helped her shoulder elbow wrist deep achy pain, no significant side effect,  UPDATE Sep 14 2022:  Every 3 months Dysport injection for left upper extremity spasticity did help her spastic pain some, but she continues to have intermittent whole left side deep achy pain, consistent with post thalamic stroke neuropathic pain, Cymbalta 60 mg has helped her,  increased to 90 mg daily, also benefit her depression  UPDATE Dec 05 2022: Return for electrical summation guided Dysport injection for spastic left upper extremity, continue help her symptoms, no significant side effect noted,  Physical exam  Vitals: BP 123/76   Pulse 72   Ht 5\' 2"  (1.575 m)   Wt 140 lb (63.5 kg)   BMI 25.61 kg/m  Last Weight:  Wt Readings from Last 1 Encounters:  12/15/22 140 lb (63.5 kg)   Last Height:   Ht Readings from Last 1 Encounters:  12/15/22 5\' 2"  (1.575 m)   PHYSICAL EXAMINATOINS:    Motor: spastic left hemiparesis,    left lower extremity moderate spasticity, left hip flexion 4, knee flexion 4, extension 5,  Significant left upper extremity spasticity, left shoulder adduction, internal rotation.left elbow flexion, pronation, with passive movement, she has full range of motion of left elbow, left wrist forceful flexion, well-healed left forearm volar surface scar, left hand stayed in Position, finger flexion,thumb in position, ,limited range of motion of left finger extension  Gait: Rising up from seated position by pushing on chair arm,spastic left hemi-circumferential gait.  Assessment and plan:   72 year old right-handed Caucasian female, status post stroke, with spastic left hemiparesis since 1999, responded very well to previous EMG guided botulism toxin injection, return to clinic for repeat injection  Under electric stimulation, 500 units of Dysport A x 3  =1500 units total  (500 units dysport was dissolved into 2.5cc of NS)  Left pronator teres 0.5cc x2= 1.0 cc Left flexor digitorum profundus  0.5ccx2=1.0 cc Left palmaris longus 0.5cc =0.5 cc Left brachialis  0.5 ccx 3-=1.5  cc Left flexor  digitorum superficialis 0.5 cc   Left pectoralis major 0.5ccx3=1.5 cc Left latissimus dorsi 0.5 ccx3=1.5cc    [name], M.D. Ph.D.  [facility] [address] [place], [place] [address] Phone: [phone] Fax:       [phone]+

## 2022-12-15 NOTE — Progress Notes (Unsigned)
Dysport 500 units x 3 vials  NDC: [address]-1 LOT#: [id number] EXP: 09/02/2024  B/B  Witnessed by Florentina Addison.

## 2022-12-16 DIAGNOSIS — I69354 Hemiplegia and hemiparesis following cerebral infarction affecting left non-dominant side: Secondary | ICD-10-CM

## 2022-12-30 ENCOUNTER — Ambulatory Visit (HOSPITAL_BASED_OUTPATIENT_CLINIC_OR_DEPARTMENT_OTHER): Payer: Medicare HMO | Admitting: Family

## 2022-12-30 ENCOUNTER — Encounter (HOSPITAL_BASED_OUTPATIENT_CLINIC_OR_DEPARTMENT_OTHER): Payer: Self-pay | Admitting: Family

## 2022-12-30 VITALS — BP 110/66 | HR 65 | Ht 62.0 in | Wt 139.0 lb

## 2022-12-30 DIAGNOSIS — Z8673 Personal history of transient ischemic attack (TIA), and cerebral infarction without residual deficits: Secondary | ICD-10-CM

## 2022-12-30 DIAGNOSIS — I25118 Atherosclerotic heart disease of native coronary artery with other forms of angina pectoris: Secondary | ICD-10-CM

## 2022-12-30 DIAGNOSIS — I6350 Cerebral infarction due to unspecified occlusion or stenosis of unspecified cerebral artery: Secondary | ICD-10-CM | POA: Diagnosis not present

## 2022-12-30 DIAGNOSIS — E785 Hyperlipidemia, unspecified: Secondary | ICD-10-CM

## 2022-12-30 MED ORDER — NITROGLYCERIN 0.4 MG SL SUBL: 0.4000 mg | SUBLINGUAL_TABLET | SUBLINGUAL | 5 refills | Status: DC | PRN

## 2022-12-30 NOTE — Progress Notes (Unsigned)
Office Visit    Patient Name: [name] Date of Encounter: 12/30/2022  PCP:  Lianne Moris, PA-C   [facility]  Cardiologist:  Chilton Si, MD  Advanced Practice Provider:  No care team member to display Electrophysiologist:  None      Chief Complaint    [name] is a 72 y.o. female presents today for CAD follow up    Past Medical History    Past Medical History:  Diagnosis Date   AMI (acute myocardial infarction) (HCC) 2007   failed PCI to the distal LAD - managed medically   Chronic pain    Complication of anesthesia    CVA (cerebral vascular accident) (HCC) 1999   Degenerative disc disease, cervical    Hypothyroidism    PONV (postoperative nausea and vomiting)    Unstable angina (HCC)    Past Surgical History:  Procedure Laterality Date   COLONOSCOPY WITH PROPOFOL N/A 05/24/2018   normal TI, significant looping of the colon, and external hemorrhoids.   ENDOMETRIAL ABLATION  3/06   left ankle surgery     OPEN REDUCTION INTERNAL FIXATION (ORIF) DISTAL RADIAL FRACTURE Left 07/23/2020   Procedure: 1.  Removal left distal radius volar plate 2.  Open reduction internal fixation left extra-articular radial shaft fracture 3.  Closed treatment distal ulnar shaft fracture ;  Surgeon: Betha Loa, MD;  Location: [facility] OR;  Service: Orthopedics;  Laterality: Left;    Allergies  Allergies  Allergen Reactions   Morphine Other (See Comments)    Sick on stomach   Rosuvastatin Other (See Comments)    myalgias   Zolpidem Tartrate Other (See Comments)    Sleep walk   Penicillins Rash and Other (See Comments)         History of Present Illness    [name] is a 72 y.o. female with a hx of CAD s/p MI (2007), CVA, HTN, hypothyroidism last seen 05/13/22 by Dr. Duke Salvia.  Prior CVA 1989. Prior MI 2007 with failed attempt at [facility] of distal LAD and has been medically managed since that time. Myoview 2017 LVEF 74% small region of apical  lateral ischemia. 01/2018 due to fatigue, chest pain Ranexa was initiated.   Presents today for follow up with her husband. Excited for upcoming trip to [facility] in [place]. 2 weeks ago had episode of chest pain while attending a dance recital. Chest pain relieved with one nitroglycerin. Otherwise feeling well, exercising regularly at the [facility] [employer] without anginal symptoms.   EKGs/Labs/Other Studies Reviewed:   The following studies were reviewed today: Cardiac Studies & Procedures     STRESS TESTS  NM MYOCAR MULTI W/SPECT W 09/03/2015  Narrative  No diagnostic ST segment changes to indicate ischemia.  Small, mild intensity, lateral apical region of ischemia in the setting of artifact from extracardiac radiotracer uptake and also arms down imaging. No large ischemic territories.  This is a low risk study.  Nuclear stress EF: 74%.               EKG:  EKG is  ordered today.  The ekg ordered today demonstrates NSR 65 bpm with no acute ST/T wave changes.  Recent Labs: No results found for requested labs within last 365 days.  Recent Lipid Panel    Component Value Date/Time   CHOL 155 04/23/2020 1105   TRIG 97 04/23/2020 1105   HDL 61 04/23/2020 1105   CHOLHDL 2.5 04/23/2020 1105   CHOLHDL 3 09/10/2009  0000   VLDL 22.2 09/10/2009 0000   LDLCALC 76 04/23/2020 1105    Home Medications   Current Meds  Medication Sig   AbobotulinumtoxinA (DYSPORT) 500 units SOLR injection Inject 1,500 Units into the muscle every 3 (three) months.   ALPRAZolam (XANAX) 0.5 MG tablet Take 0.5 mg by mouth at bedtime.   aspirin 81 MG tablet Take 81 mg by mouth daily.   cetirizine (ZYRTEC) 10 MG tablet Take 10 mg by mouth daily as needed for allergies.   Cholecalciferol (VITAMIN D3) 1000 units CAPS Take 1,000 Units by mouth daily.   clopidogrel (PLAVIX) 75 MG tablet Take 1 tablet (75 mg total) by mouth daily.   DENTA 5000 PLUS 1.1 % CREA dental cream Place 1 application onto teeth at bedtime.     diclofenac Sodium (VOLTAREN) 1 % GEL Apply 4 g topically 3 (three) times daily as needed (pain).   diphenhydrAMINE (BENADRYL) 12.5 MG/5ML liquid Take 12.5 mg by mouth 4 (four) times daily as needed for allergies.   DULoxetine (CYMBALTA) 30 MG capsule Take 1 capsule (30 mg total) by mouth daily.   DULoxetine (CYMBALTA) 60 MG capsule Take 1 capsule (60 mg total) by mouth at bedtime.   gabapentin (NEURONTIN) 600 MG tablet Take 600 mg by mouth 2 (two) times daily.   guaiFENesin (MUCINEX) 600 MG 12 hr tablet Take 600 mg by mouth 2 (two) times daily as needed for cough or to loosen phlegm.   hydrocortisone (ANUSOL-HC) 2.5 % rectal cream Place 1 Application rectally 2 (two) times daily. For rectal itching, burning, bleeding   ibandronate (BONIVA) 150 MG tablet Take 150 mg by mouth every 30 (thirty) days. Take in the morning with a full glass of water, on an empty stomach, and do not take anything else by mouth or lie down for the next 30 min.   levothyroxine (SYNTHROID, LEVOTHROID) 75 MCG tablet Take 75 mcg by mouth daily before breakfast.   lubiprostone (AMITIZA) 24 MCG capsule TAKE 1 CAPSULE TWICE DAILY WITH MEALS   lubiprostone (AMITIZA) 24 MCG capsule Take 1 capsule (24 mcg total) by mouth 2 (two) times daily with a meal.   nitroGLYCERIN (NITROSTAT) 0.4 MG SL tablet Place 0.4 mg under the tongue every 5 (five) minutes as needed for chest pain.   NUCYNTA 50 MG tablet Take 50 mg by mouth in the morning, at noon, and at bedtime.   NUCYNTA ER 50 MG 12 hr tablet Take 50 mg by mouth every 12 (twelve) hours.    pantoprazole (PROTONIX) 40 MG tablet Take 40 mg by mouth daily as needed (for acid reflux).   ranolazine (RANEXA) 500 MG 12 hr tablet TAKE 1 TABLET DAILY   rOPINIRole (REQUIP) 2 MG tablet Take 2 mg by mouth at bedtime.   sennosides-docusate sodium (SENOKOT-S) 8.6-50 MG tablet Take 2 tablets by mouth daily. As needed   Study - ORION 4 - inclisiran 300 mg/1.57mL or placebo SQ injection (PI-Stuckey)  Inject 300 mg into the skin every 6 (six) months.   Current Facility-Administered Medications for the 12/30/22 encounter (Office Visit) with [name], NP  Medication   AbobotulinumtoxinA (DYSPORT) 500 units injection 500 Units   Study - ORION 4 - inclisiran 300 mg/1.32mL or placebo SQ injection (PI-Stuckey)     Review of Systems      All other systems reviewed and are otherwise negative except as noted above.  Physical Exam    VS:  BP 110/66   Pulse 65   Ht 5'  2" (1.575 m)   Wt 139 lb (63 kg)   BMI 25.42 kg/m  , BMI Body mass index is 25.42 kg/m.  Wt Readings from Last 3 Encounters:  12/30/22 139 lb (63 kg)  12/15/22 140 lb (63.5 kg)  09/14/22 148 lb (67.1 kg)    GEN: Well nourished, well developed, in no acute distress. HEENT: normal. Neck: Supple, no JVD, carotid bruits, or masses. Cardiac: RRR, no murmurs, rubs, or gallops. No clubbing, cyanosis, edema.  Radials/PT 2+ and equal bilaterally.  Respiratory:  Respirations regular and unlabored, clear to auscultation bilaterally. GI: Soft, nontender, nondistended. MS: No deformity or atrophy. Skin: Warm and dry, no rash. Neuro:  Strength and sensation are intact. Psych: Normal affect.  Assessment & Plan    CAD / HLD - 2007 MI with failed PCI with medical management since that time. Single episode of chest pain resolved with nitroglycerin which occurred at rest. Exercising without anginal symptoms. No indication for ischemic evaluation. GDMT includes aspirin, plavix, lipid study meds, ranexa. PRN nitroglycerin refilled today. Presently in ORION 4 study for Incliseron. Intolerant to statin. Will reach out to research team to determine study end date.  Hx of CVA with residual left hemiplegia - Continue Aspirin, Clopidogrel, Incliseron.          Disposition: Follow up in 6 month(s) with Chilton Si, MD or APP.  Signed, [name], NP 12/30/2022, 11:39 AM [facility]

## 2022-12-30 NOTE — Patient Instructions (Signed)
Medication Instructions:  Your physician recommends that you continue on your current medications as directed. Please refer to the Current Medication list given to you today.  *If you need a refill on your cardiac medications before your next appointment, please call your pharmacy*  Follow-Up: At [facility], you and your health needs are our priority.  As part of our continuing mission to provide you with exceptional heart care, we have created designated Provider Care Teams.  These Care Teams include your primary Cardiologist (physician) and Advanced Practice Providers (APPs -  Physician Assistants and Nurse Practitioners) who all work together to provide you with the care you need, when you need it.  We recommend signing up for the patient portal called "MyChart".  Sign up information is provided on this After Visit Summary.  MyChart is used to connect with patients for Virtual Visits (Telemedicine).  Patients are able to view lab/test results, encounter notes, upcoming appointments, etc.  Non-urgent messages can be sent to your provider as well.   To learn more about what you can do with MyChart, go to [email or web address].    Your next appointment:   6 month(s)  Provider:   [facility], MD or Caitlin Walker, NP    

## 2022-12-31 ENCOUNTER — Encounter (HOSPITAL_BASED_OUTPATIENT_CLINIC_OR_DEPARTMENT_OTHER): Payer: Self-pay | Admitting: Family

## 2023-01-01 ENCOUNTER — Encounter (HOSPITAL_BASED_OUTPATIENT_CLINIC_OR_DEPARTMENT_OTHER): Payer: Self-pay

## 2023-02-02 ENCOUNTER — Other Ambulatory Visit: Payer: Self-pay

## 2023-03-15 ENCOUNTER — Ambulatory Visit: Payer: Medicare HMO | Admitting: Neurology

## 2023-03-16 ENCOUNTER — Ambulatory Visit: Payer: Medicare HMO | Admitting: Neurology

## 2023-03-16 VITALS — BP 135/83 | HR 79

## 2023-03-16 DIAGNOSIS — I69354 Hemiplegia and hemiparesis following cerebral infarction affecting left non-dominant side: Secondary | ICD-10-CM | POA: Diagnosis not present

## 2023-03-16 MED ORDER — ABOBOTULINUMTOXINA 500 UNITS IM SOLR
1500.0000 [IU] | INTRAMUSCULAR | Status: AC
  Administered 2023-03-16: 1500 [IU] via INTRAMUSCULAR

## 2023-03-16 NOTE — Progress Notes (Signed)
Dysport 500units x 3 vial  Ndc-[address]-01 [id number] Exp-09/02/24 B/B  Bacteriostatic 0.9% Sodium Chloride- 7.5 mL  Lot: [id number] Expiration: 11/02/23 NDC: 3244-0102-72 Dx: Z36.644 WITNESSED IH:[id number] jone rn

## 2023-03-16 NOTE — Progress Notes (Signed)
[facility]  CC:  Spastic hemiplegia  [name] is a 72  y.o. Right-handed Caucasian female, return for EMG guided Botox injection for her left spastic hemiparesis,  She suffered stroke in 1999, with residual left spastic hemiparesis, upper extremity more than leg, left visual field cut  She previously was enrolled in Botox research study for spastic upper extremity at [facility] by Dr. Marya Fossa, received EMG guided Botox injection in 2010 for about 2 years, with good benefit, but has difficulty with insurance  She began to receive Botox injection through our clinic by Dr. Hosie Poisson since May 2015,150 units total to left upper extremity, which has been very helpful  She is return for continued EMG guided Botox injection, at baseline, she can ambulate with a left AFO, but without assistance,  Profound left upper extremity spasticity, achiness, chronic pain, persistent left wrist flexion, finger flexion,  She also has a history of coronary artery disease in 2007, taking Plavix, and Aggrenox, exercise regularly, has a Systems analyst  She began to receive botulism toxin injection for spastic left hemiparesis since November 2015, initially uses only 300 units, for both left upper and lower extremity, later with the exercise on left upper extremity only, Botox a was changed to Dysport 500 units x 3, per insurance requirement  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, especially left shoulder, elbow, wrist, injection does help her  UPDATE March 23, 2022: She did well with previous injection, helped her shoulder elbow wrist deep achy pain, no significant side effect,  UPDATE Sep 14 2022:  Every 3 months Dysport injection for left upper extremity spasticity did help her spastic pain some, but she continues to have intermittent whole left side deep achy pain, consistent with post thalamic stroke neuropathic pain, Cymbalta 60 mg has helped her,  increased to 90 mg daily, also benefit her depression  UPDATE Dec 05 2022: Return for electrical summation guided Dysport injection for spastic left upper extremity, continue help her symptoms, no significant side effect noted,  UPDATE March 16 2023: She did well with previous injection  Physical exam  Vitals: BP 135/83   Pulse 79  Last Weight:  Wt Readings from Last 1 Encounters:  12/30/22 139 lb (63 kg)   Last Height:   Ht Readings from Last 1 Encounters:  12/30/22 5\' 2"  (1.575 m)   PHYSICAL EXAMINATOINS:    Motor: spastic left hemiparesis,    left lower extremity moderate spasticity, left hip flexion 4, knee flexion 4, extension 5,  Significant left upper extremity spasticity, left shoulder adduction, internal rotation.left elbow flexion, pronation, with passive movement, she has full range of motion of left elbow, left wrist forceful flexion, well-healed left forearm volar surface scar, left hand stayed in Position, finger flexion,thumb in position, ,limited range of motion of left finger extension  Gait: Rising up from seated position by pushing on chair arm,spastic left hemi-circumferential gait.  Assessment and plan:   72 year old right-handed Caucasian female, status post stroke, with spastic left hemiparesis since 1999, responded very well to previous EMG guided botulism toxin injection, return to clinic for repeat injection  Under electric stimulation, 500 units of Dysport A x 3  =1500 units total  (500 units dysport was dissolved into 2.5cc of NS)  Left flexor digitorum profundus  0.5ccx2=1.0 cc Left palmaris longus 0.5cc Left opponent 0.5 cc Left flexor digitorum superficialis 0.5 cc   Left pectoralis major 0.5ccx 5=2.5c Left latissimus dorsi 0.5 ccx 5=2.5cc    [name]  [name], M.D. Ph.D.  [facility] [address] [place], [place] [address] Phone: [phone] Fax:      [phone]+

## 2023-04-20 ENCOUNTER — Encounter: Payer: Self-pay | Admitting: Gastroenterology

## 2023-04-20 ENCOUNTER — Encounter: Payer: Medicare HMO | Admitting: *Deleted

## 2023-04-20 ENCOUNTER — Other Ambulatory Visit (HOSPITAL_BASED_OUTPATIENT_CLINIC_OR_DEPARTMENT_OTHER): Payer: Self-pay | Admitting: Cardiovascular Disease

## 2023-04-20 DIAGNOSIS — Z006 Encounter for examination for normal comparison and control in clinical research program: Secondary | ICD-10-CM

## 2023-04-20 MED ORDER — STUDY - ORION 4 - INCLISIRAN 300 MG/1.5 ML OR PLACEBO SQ INJECTION (PI-STUCKEY)
300.0000 mg | INJECTION | SUBCUTANEOUS | Status: DC
  Administered 2023-04-20: 300 mg via SUBCUTANEOUS
  Filled 2023-04-20: qty 1.5

## 2023-04-27 ENCOUNTER — Other Ambulatory Visit: Payer: Self-pay | Admitting: Gastroenterology

## 2023-04-30 NOTE — Research (Signed)
Orion 4  Patient doing well.  No complaints  NO AE/SAE to report

## 2023-05-19 ENCOUNTER — Telehealth: Payer: Self-pay | Admitting: Neurology

## 2023-05-19 NOTE — Telephone Encounter (Signed)
Pt's Dysport auth expires 06/10/2023. I completed auth renewal form and faxed along with OV notes to [facility] @ [phone].

## 2023-05-24 NOTE — Telephone Encounter (Signed)
Received fax of approval from [facility].   PA# [id number] (05/19/23-05/18/24)

## 2023-05-25 ENCOUNTER — Ambulatory Visit: Payer: Medicare HMO | Admitting: Gastroenterology

## 2023-05-25 ENCOUNTER — Encounter: Payer: Self-pay | Admitting: Gastroenterology

## 2023-05-25 VITALS — BP 138/84 | HR 83 | Temp 98.6°F | Ht 62.0 in | Wt 141.2 lb

## 2023-05-25 DIAGNOSIS — K5903 Drug induced constipation: Secondary | ICD-10-CM

## 2023-05-25 DIAGNOSIS — K219 Gastro-esophageal reflux disease without esophagitis: Secondary | ICD-10-CM

## 2023-05-25 DIAGNOSIS — T402X5A Adverse effect of other opioids, initial encounter: Secondary | ICD-10-CM | POA: Diagnosis not present

## 2023-05-25 DIAGNOSIS — K5909 Other constipation: Secondary | ICD-10-CM

## 2023-05-25 MED ORDER — HYDROCORTISONE (PERIANAL) 2.5 % EX CREA: 1.0000 | TOPICAL_CREAM | Freq: Two times a day (BID) | CUTANEOUS | 1 refills | Status: AC

## 2023-05-25 MED ORDER — LUBIPROSTONE 24 MCG PO CAPS: 24.0000 ug | ORAL_CAPSULE | Freq: Two times a day (BID) | ORAL | 3 refills | Status: DC

## 2023-05-25 NOTE — Progress Notes (Signed)
Gastroenterology Office Note     Primary Care Physician:  Lianne Moris, PA-C  Primary Gastroenterologist: Dr Marletta Lor   Chief Complaint   Chief Complaint  Patient presents with   Follow-up    Follow up for constipation. Pt is doing good     History of Present Illness   [name] is a delightful 72 y.o. female presenting today with a history of OIC, previously failing Linzess and Movantik. On Amitiza 24 mcg po BID. Last seen in 2023 for virtual visit. Chronic GERD.   Last colonoscopy Oct 2019: normal TI, significant looping of colon, external hemorrhoids    Amitiza 24 mcg po BID along with Senokot 2 tabs daily. This manages her symptoms well. She got off track a bit when on vacation due to different foods and schedules; however, she is back to her baseline. Pantoprazole just as needed. No dysphagia. She is doing quite well from a GI standpoint. Turned 72 this year.    Past Medical History:  Diagnosis Date   AMI (acute myocardial infarction) (HCC) 2007   failed PCI to the distal LAD - managed medically   Chronic pain    Complication of anesthesia    CVA (cerebral vascular accident) (HCC) 1999   Degenerative disc disease, cervical    Hypothyroidism    PONV (postoperative nausea and vomiting)    Unstable angina (HCC)     Past Surgical History:  Procedure Laterality Date   COLONOSCOPY WITH PROPOFOL N/A 05/24/2018   normal TI, significant looping of the colon, and external hemorrhoids.   ENDOMETRIAL ABLATION  3/06   left ankle surgery     OPEN REDUCTION INTERNAL FIXATION (ORIF) DISTAL RADIAL FRACTURE Left 07/23/2020   Procedure: 1.  Removal left distal radius volar plate 2.  Open reduction internal fixation left extra-articular radial shaft fracture 3.  Closed treatment distal ulnar shaft fracture ;  Surgeon: Betha Loa, MD;  Location: [facility] OR;  Service: Orthopedics;  Laterality: Left;    Current Outpatient Medications  Medication Sig Dispense Refill    AbobotulinumtoxinA (DYSPORT) 500 units SOLR injection Inject 1,500 Units into the muscle every 3 (three) months.     ALPRAZolam (XANAX) 0.5 MG tablet Take 0.5 mg by mouth at bedtime.     aspirin 81 MG tablet Take 81 mg by mouth daily.     cetirizine (ZYRTEC) 10 MG tablet Take 10 mg by mouth daily as needed for allergies.     Cholecalciferol (VITAMIN D3) 1000 units CAPS Take 1,000 Units by mouth daily.     clopidogrel (PLAVIX) 75 MG tablet Take 1 tablet (75 mg total) by mouth daily. 90 tablet 3   DENTA 5000 PLUS 1.1 % CREA dental cream Place 1 application onto teeth at bedtime.   4   diclofenac Sodium (VOLTAREN) 1 % GEL Apply 4 g topically 3 (three) times daily as needed (pain).     diphenhydrAMINE (BENADRYL) 12.5 MG/5ML liquid Take 12.5 mg by mouth 4 (four) times daily as needed for allergies.     DULoxetine (CYMBALTA) 30 MG capsule Take 1 capsule (30 mg total) by mouth daily. 90 capsule 3   DULoxetine (CYMBALTA) 60 MG capsule Take 1 capsule (60 mg total) by mouth at bedtime. 90 capsule 3   gabapentin (NEURONTIN) 600 MG tablet Take 600 mg by mouth 2 (two) times daily.     guaiFENesin (MUCINEX) 600 MG 12 hr tablet Take 600 mg by mouth 2 (two) times daily as needed for cough  or to loosen phlegm.     ibandronate (BONIVA) 150 MG tablet Take 150 mg by mouth every 30 (thirty) days. Take in the morning with a full glass of water, on an empty stomach, and do not take anything else by mouth or lie down for the next 30 min.     levothyroxine (SYNTHROID, LEVOTHROID) 75 MCG tablet Take 75 mcg by mouth daily before breakfast.     lubiprostone (AMITIZA) 24 MCG capsule TAKE 1 CAPSULE TWICE DAILY WITH MEALS 60 capsule 3   NUCYNTA 50 MG tablet Take 50 mg by mouth in the morning, at noon, and at bedtime.  0   NUCYNTA ER 50 MG 12 hr tablet Take 50 mg by mouth every 12 (twelve) hours.   0   pantoprazole (PROTONIX) 40 MG tablet Take 40 mg by mouth daily as needed (for acid reflux).     ranolazine (RANEXA) 500 MG 12 hr  tablet TAKE 1 TABLET DAILY 90 tablet 2   rOPINIRole (REQUIP) 2 MG tablet Take 2 mg by mouth at bedtime.     sennosides-docusate sodium (SENOKOT-S) 8.6-50 MG tablet Take 2 tablets by mouth daily. As needed     Study - ORION 4 - inclisiran 300 mg/1.74mL or placebo SQ injection (PI-Stuckey) Inject 300 mg into the skin every 6 (six) months.     hydrocortisone (ANUSOL-HC) 2.5 % rectal cream Place 1 Application rectally 2 (two) times daily. For rectal itching, burning, bleeding 30 g 1   lubiprostone (AMITIZA) 24 MCG capsule Take 1 capsule (24 mcg total) by mouth 2 (two) times daily with a meal. 180 capsule 3   Current Facility-Administered Medications  Medication Dose Route Frequency Provider Last Rate Last Admin   AbobotulinumtoxinA (DYSPORT) 500 units injection 1,500 Units  1,500 Units Intramuscular Q90 days [name], MD   1,500 Units at 03/16/23 1348   AbobotulinumtoxinA (DYSPORT) 500 units injection 500 Units  500 Units Intramuscular Q90 days [name], MD   500 Units at 12/16/22 1033   Study - ORION 4 - inclisiran 300 mg/1.75mL or placebo SQ injection (PI-Stuckey)  300 mg Subcutaneous Q6 months    300 mg at 04/20/23 1039    Allergies as of 05/25/2023 - Review Complete 05/25/2023  Allergen Reaction Noted   Morphine Other (See Comments) 01/01/2009   Rosuvastatin Other (See Comments) 01/28/2021   Zolpidem tartrate Other (See Comments) 01/01/2009   Penicillins Rash and Other (See Comments) 01/01/2009    Family History  Problem Relation Age of Onset   Arrhythmia Father        PPM   Colon cancer Neg Hx    Breast cancer Neg Hx     Social History   Socioeconomic History   Marital status: Married    Spouse name: Not on file   Number of children: Not on file   Years of education: Not on file   Highest education level: Not on file  Occupational History   Occupation: disabled  Tobacco Use   Smoking status: Former   Smokeless tobacco: Never   Tobacco comments:    Quit in 2005  Vaping  Use   Vaping status: Never Used  Substance and Sexual Activity   Alcohol use: Yes    Comment: social   Drug use: No   Sexual activity: Not Currently  Other Topics Concern   Not on file  Social History Narrative   Married, no children   Right handed   12th grade   1 cup daily  Social Determinants of Health   Financial Resource Strain: Not on file  Food Insecurity: Not on file  Transportation Needs: Not on file  Physical Activity: Not on file  Stress: Not on file  Social Connections: Not on file  Intimate Partner Violence: Not on file     Review of Systems   Gen: Denies any fever, chills, fatigue, weight loss, lack of appetite.  CV: Denies chest pain, heart palpitations, peripheral edema, syncope.  Resp: Denies shortness of breath at rest or with exertion. Denies wheezing or cough.  GI: Denies dysphagia or odynophagia. Denies jaundice, hematemesis, fecal incontinence. GU : Denies urinary burning, urinary frequency, urinary hesitancy MS: Denies joint pain, muscle weakness, cramps, or limitation of movement.  Derm: Denies rash, itching, dry skin Psych: Denies depression, anxiety, memory loss, and confusion Heme: Denies bruising, bleeding, and enlarged lymph nodes.   Physical Exam   BP 138/84   Pulse 83   Temp 98.6 F (37 C)   Ht 5\' 2"  (1.575 m)   Wt 141 lb 3.2 oz (64 kg)   BMI 25.83 kg/m  General:   Alert and oriented. Pleasant and cooperative. Well-nourished and well-developed.  Head:  Normocephalic and atraumatic. Eyes:  Without icterus Abdomen:  +BS, soft, non-tender and non-distended. No HSM noted. No guarding or rebound. No masses appreciated.  Rectal:  Deferred  Msk:  Symmetrical without gross deformities. Normal posture. Extremities:  Without edema. Neurologic:  Alert and  oriented x4;  grossly normal neurologically. Skin:  Intact without significant lesions or rashes. Psych:  Alert and cooperative. Normal mood and affect.   Assessment   [name] is a 72 y.o. female presenting today with a history of OIC and GERD for routine follow-up.  OIC managed well with Amitiza 24 mcg po BID and Senokot. Will refill this for an additional year. Colonoscopy on file from 2019.  GERD overall well managed with only prn PPI. No alarm signs/symptoms.    PLAN   Refills for Amitiza provided Pt requested refill for anusol to have on hand but doing well from this standpoint Return in 1 year or sooner if needed   Gelene Mink, PhD, ANP-BC [facility]

## 2023-05-25 NOTE — Patient Instructions (Signed)
I am so glad you are doing well!  I have refilled Amitiza and the hemorrhoid cream.  We will see you in 1 year!  I enjoyed seeing you again today! I value our relationship and want to provide genuine, compassionate, and quality care. You may receive a survey regarding your visit with me, and I welcome your feedback! Thanks so much for taking the time to complete this. I look forward to seeing you again.      Gelene Mink, PhD, ANP-BC [facility]

## 2023-06-15 ENCOUNTER — Ambulatory Visit: Payer: Medicare HMO | Admitting: Neurology

## 2023-06-15 ENCOUNTER — Encounter: Payer: Self-pay | Admitting: Neurology

## 2023-06-15 VITALS — BP 122/62 | Ht 62.0 in | Wt 143.0 lb

## 2023-06-15 DIAGNOSIS — I639 Cerebral infarction, unspecified: Secondary | ICD-10-CM

## 2023-06-15 DIAGNOSIS — I69354 Hemiplegia and hemiparesis following cerebral infarction affecting left non-dominant side: Secondary | ICD-10-CM | POA: Diagnosis not present

## 2023-06-15 DIAGNOSIS — M792 Neuralgia and neuritis, unspecified: Secondary | ICD-10-CM

## 2023-06-15 MED ORDER — ABOBOTULINUMTOXINA 500 UNITS IM SOLR
1500.0000 [IU] | Freq: Once | INTRAMUSCULAR | Status: AC
Start: 2023-06-15 — End: 2023-06-15
  Administered 2023-06-15: 1500 [IU] via INTRAMUSCULAR

## 2023-06-15 MED ORDER — LAMOTRIGINE 25 MG PO TABS: ORAL_TABLET | ORAL | 0 refills | Status: DC

## 2023-06-15 MED ORDER — LAMOTRIGINE 100 MG PO TABS: 100.0000 mg | ORAL_TABLET | Freq: Two times a day (BID) | ORAL | 11 refills | Status: DC

## 2023-06-15 NOTE — Progress Notes (Signed)
[facility]  CC:  Spastic hemiplegia  [name] is a 72  y.o. Right-handed Caucasian female, return for EMG guided Botox injection for her left spastic hemiparesis,  She suffered stroke in 1999, with residual left spastic hemiparesis, upper extremity more than leg, left visual field cut  She previously was enrolled in Botox research study for spastic upper extremity at [facility] by Dr. Marya Fossa, received EMG guided Botox injection in 2010 for about 2 years, with good benefit, but has difficulty with insurance  She began to receive Botox injection through our clinic by Dr. Hosie Poisson since May 2015,150 units total to left upper extremity, which has been very helpful  She is return for continued EMG guided Botox injection, at baseline, she can ambulate with a left AFO, but without assistance,  Profound left upper extremity spasticity, achiness, chronic pain, persistent left wrist flexion, finger flexion,  She also has a history of coronary artery disease in 2007, taking Plavix, and Aggrenox, exercise regularly, has a Systems analyst  She began to receive botulism toxin injection for spastic left hemiparesis since November 2015, initially uses only 300 units, for both left upper and lower extremity, later with the exercise on left upper extremity only, Botox a was changed to Dysport 500 units x 3, per insurance requirement  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, especially left shoulder, elbow, wrist, injection does help her  UPDATE March 23, 2022: She did well with previous injection, helped her shoulder elbow wrist deep achy pain, no significant side effect,  UPDATE Sep 14 2022:  Every 3 months Dysport injection for left upper extremity spasticity did help her spastic pain some, but she continues to have intermittent whole left side deep achy pain, consistent with post thalamic stroke neuropathic pain, Cymbalta 60 mg has helped her,  increased to 90 mg daily, also benefit her depression  UPDATE Dec 05 2022: Return for electrical summation guided Dysport injection for spastic left upper extremity, continue help her symptoms, no significant side effect noted,  UPDATE March 16 2023: She did well with previous injection  UPDATE Jun 15 2023: She did well with previous injection  Physical exam  Vitals: BP 122/62 (BP Location: Right Arm, Patient Position: Sitting)   Ht 5\' 2"  (1.575 m)   Wt 143 lb (64.9 kg)   BMI 26.16 kg/m  Last Weight:  Wt Readings from Last 1 Encounters:  06/15/23 143 lb (64.9 kg)   Last Height:   Ht Readings from Last 1 Encounters:  06/15/23 5\' 2"  (1.575 m)   PHYSICAL EXAMINATOINS:    Motor: spastic left hemiparesis,    left lower extremity moderate spasticity, left hip flexion 4, knee flexion 4, extension 5,  Significant left upper extremity spasticity, left shoulder adduction, internal rotation.left elbow flexion, pronation, with passive movement, she has full range of motion of left elbow, left wrist forceful flexion, well-healed left forearm volar surface scar, left hand stayed in Position, finger flexion,thumb in position, ,limited range of motion of left finger extension  Gait: Rising up from seated position by pushing on chair arm,spastic left hemi-circumferential gait.  Assessment and plan:   72 year old right-handed Caucasian female, status post stroke, with spastic left hemiparesis since 1999, responded very well to previous EMG guided botulism toxin injection, return to clinic for repeat injection  Under electric stimulation, 500 units of Dysport A x 3  =1500 units total  (500 units dysport was dissolved into 2.5cc of NS)  Left flexor digitorum profundus  0.5ccx2=1.0 cc Left palmaris longus 0.5cc Left brachialis 0.5 cc Left flexor digitorum superficialis 0.5 cc   Left pectoralis major 0.5ccx 5=2.5c Left latissimus dorsi 0.5 ccx 5=2.5cc    [name], M.D.  Ph.D.  [facility] [address] [place], [place] [address] Phone: [phone] Fax:      [phone]

## 2023-06-15 NOTE — Progress Notes (Signed)
Dysport 500units x 3 vial  [id number] Exp-02.28.2026 B/B Bacteriostatic 0.9% Sodium Chloride- 7.5 mL  Lot: [id number] Expiration: 04.01.2025 NDC: [id number] Dx: U13.244 WITNESSED [id number], CMA

## 2023-07-12 ENCOUNTER — Ambulatory Visit (HOSPITAL_BASED_OUTPATIENT_CLINIC_OR_DEPARTMENT_OTHER): Payer: Medicare HMO | Admitting: Cardiovascular Disease

## 2023-07-12 ENCOUNTER — Encounter (HOSPITAL_BASED_OUTPATIENT_CLINIC_OR_DEPARTMENT_OTHER): Payer: Self-pay | Admitting: Cardiovascular Disease

## 2023-07-12 VITALS — BP 110/80 | HR 96 | Ht 62.0 in | Wt 142.9 lb

## 2023-07-12 DIAGNOSIS — I6350 Cerebral infarction due to unspecified occlusion or stenosis of unspecified cerebral artery: Secondary | ICD-10-CM

## 2023-07-12 DIAGNOSIS — E7800 Pure hypercholesterolemia, unspecified: Secondary | ICD-10-CM

## 2023-07-12 DIAGNOSIS — I2510 Atherosclerotic heart disease of native coronary artery without angina pectoris: Secondary | ICD-10-CM

## 2023-07-12 NOTE — Patient Instructions (Signed)
Medication Instructions:  Your physician recommends that you continue on your current medications as directed. Please refer to the Current Medication list given to you today.  *If you need a refill on your cardiac medications before your next appointment, please call your pharmacy*  Lab Work: NONE  Testing/Procedures: NONE  Follow-Up: At [facility], you and your health needs are our priority.  As part of our continuing mission to provide you with exceptional heart care, we have created designated Provider Care Teams.  These Care Teams include your primary Cardiologist (physician) and Advanced Practice Providers (APPs -  Physician Assistants and Nurse Practitioners) who all work together to provide you with the care you need, when you need it.  We recommend signing up for the patient portal called "MyChart".  Sign up information is provided on this After Visit Summary.  MyChart is used to connect with patients for Virtual Visits (Telemedicine).  Patients are able to view lab/test results, encounter notes, upcoming appointments, etc.  Non-urgent messages can be sent to your provider as well.   To learn more about what you can do with MyChart, go to [email or web address].    Your next appointment:   12 month(s)  The format for your next appointment:   In Person  Provider:   Chilton Si, MD OR Ronn Melena NP

## 2023-07-12 NOTE — Progress Notes (Signed)
Cardiology Office Note:  .   Date:  07/12/2023  ID:  [name], DOB 1951-03-11, MRN [id number] PCP: Dawn Moris, PA-C  [facility] Providers Cardiologist:  Dawn Si, MD    History of Present Illness: .   [name] is a 72 y.o. female with a hx of CAD s/p MI (2007), stroke, L hemiplegia, hypertension, and hypothyroidism here for follow up. She was previously a patient of Dawn Hawthorne, NP. She had an acute MI in 2007 with a failed attempt at PCI of the distal LAD. She has been medically managed since that time. She also had a stroke in 1989 and has remained on DAPT, aspirin, and plavix. She had a nuclear stress test in 2017 with LVEF 74% and a small region of apical lateral ischemia. She was seen 01/2018 for fatigue and chest pain and was started on renexa. Her anginal equivalent is jaw pain.   At her visit 05/2022 she was doing well. She had rare chest pain.  One episode was alleviated with taking pantoprazole.  She was exercising on Mondays and Wednesdays at the [facility] and goes for bike rides Sundays and Tuesdays. She was also participating in [employer].   [name] presents with a single episode of chest discomfort that woke her up at 5 AM. She took Protonix, which alleviated the discomfort. She denies any recent changes in diet, specifically denying late meals or spicy foods. She has a history of similar episodes, which are typically attributed to acid reflux.  She maintains an active lifestyle, including regular visits to the gym and daily bike rides. She denies experiencing chest pain during exercise. She also denies any recent swelling in her legs or feet and reports her breathing has been good.  She occasionally experiences dizziness and lightheadedness, which she attributes to her pain medication. Her blood pressure readings at home have been within normal limits, even during these episodes.      ROS:  As per HPI  Studies Reviewed: .         Risk  Assessment/Calculations:             Physical Exam:   VS:  BP 110/80 (BP Location: Right Arm, Patient Position: Sitting, Cuff Size: Normal)   Pulse 96   Ht 5\' 2"  (1.575 m)   Wt 142 lb 14.4 oz (64.8 kg)   SpO2 95%   BMI 26.14 kg/m  , BMI Body mass index is 26.14 kg/m. GENERAL:  Well appearing HEENT: Pupils equal round and reactive, fundi not visualized, oral mucosa unremarkable NECK:  No jugular venous distention, waveform within normal limits, carotid upstroke brisk and symmetric, no bruits, no thyromegaly LUNGS:  Clear to auscultation bilaterally HEART:  RRR.  PMI not displaced or sustained,S1 and S2 within normal limits, no S3, no S4, no clicks, no rubs, n murmurs ABD:  Flat, positive bowel sounds normal in frequency in pitch, no bruits, no rebound, no guarding, no midline pulsatile mass, no hepatomegaly, no splenomegaly EXT:  2 plus pulses throughout, no edema, no cyanosis no clubbing SKIN:  No rashes no nodules NEURO:  Cranial nerves II through XII grossly intact, motor grossly intact throughout PSYCH:  Cognitively intact, oriented to person place and time   ASSESSMENT AND PLAN: .    # CVA:  She has residual left hemiplegia.  Continue aspirin, clopidogrel, and lipids managed through her Inclisiran study.  # Gastroesophageal Reflux Disease (GERD) Reported episode of chest pain relieved by Protonix, likely secondary to  acid reflux. No associated symptoms during exercise. -Continue current management.    # CAD: Medically managed CAD.   No reported chest pain during exercise, no swelling in legs or feet, and good blood pressure control. Patient is on Aspirin, Plavix, and Ranexa for prevention of strokes and heart attacks. -Continue current medications. -Continue regular exercise regimen.  # Hyperlipidemia Patient is currently in a cholesterol study.   -No changes to current management.  We don't check her lipids while on the study.  Follow-up in 1 year or sooner if any  issues arise.       Signed, Dawn Si, MD

## 2023-08-12 ENCOUNTER — Telehealth: Payer: Self-pay

## 2023-08-12 NOTE — Telephone Encounter (Signed)
 PA done on Cover My Meds for Lubiprostone . Tried/failed: Linzess and Movantik. Dx used: K59.09. waiting on a response from Cover My Meds

## 2023-08-13 NOTE — Telephone Encounter (Signed)
 Documentation from [facility] approving coverage for the pt's Lubiprostone Cap from 08/04/2023---08/02/2024. Given to Darl Pikes for scan to chart

## 2023-08-27 ENCOUNTER — Other Ambulatory Visit: Payer: Self-pay | Admitting: Neurology

## 2023-09-09 ENCOUNTER — Ambulatory Visit: Payer: Medicare HMO | Admitting: Neurology

## 2023-09-14 ENCOUNTER — Ambulatory Visit: Payer: Medicare HMO | Admitting: Neurology

## 2023-09-14 ENCOUNTER — Telehealth: Payer: Self-pay | Admitting: Neurology

## 2023-09-14 VITALS — BP 115/69 | HR 89

## 2023-09-14 DIAGNOSIS — I69354 Hemiplegia and hemiparesis following cerebral infarction affecting left non-dominant side: Secondary | ICD-10-CM

## 2023-09-14 MED ORDER — ABOBOTULINUMTOXINA 500 UNITS IM SOLR
1500.0000 [IU] | Freq: Once | INTRAMUSCULAR | Status: AC
Start: 2023-09-14 — End: 2023-09-14
  Administered 2023-09-14: 1500 [IU] via INTRAMUSCULAR

## 2023-09-14 MED ORDER — ABOBOTULINUMTOXINA 500 UNITS IM SOLR
1500.0000 [IU] | Freq: Once | INTRAMUSCULAR | Status: DC
Start: 2023-09-14 — End: 2023-09-14

## 2023-09-14 NOTE — Progress Notes (Signed)
[facility]  CC:  Spastic hemiplegia  [name] is a 73  y.o. Right-handed Caucasian female, return for EMG guided Botox injection for her left spastic hemiparesis,  She suffered stroke in 1999, with residual left spastic hemiparesis, upper extremity more than leg, left visual field cut  She previously was enrolled in Botox research study for spastic upper extremity at [facility] by Dr. Marya Fossa, received EMG guided Botox injection in 2010 for about 2 years, with good benefit, but has difficulty with insurance  She began to receive Botox injection through our clinic by Dr. Hosie Poisson since May 2015,150 units total to left upper extremity, which has been very helpful  She is return for continued EMG guided Botox injection, at baseline, she can ambulate with a left AFO, but without assistance,  Profound left upper extremity spasticity, achiness, chronic pain, persistent left wrist flexion, finger flexion,  She also has a history of coronary artery disease in 2007, taking Plavix, and Aggrenox, exercise regularly, has a Systems analyst  She began to receive botulism toxin injection for spastic left hemiparesis since November 2015, initially uses only 300 units, for both left upper and lower extremity, later with the exercise on left upper extremity only, Botox a was changed to Dysport 500 units x 3, per insurance requirement  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, especially left shoulder, elbow, wrist, injection does help her  UPDATE March 23, 2022: She did well with previous injection, helped her shoulder elbow wrist deep achy pain, no significant side effect,  UPDATE Sep 14 2022:  Every 3 months Dysport injection for left upper extremity spasticity did help her spastic pain some, but she continues to have intermittent whole left side deep achy pain, consistent with post thalamic stroke neuropathic pain, Cymbalta 60 mg has helped her,  increased to 90 mg daily, also benefit her depression  UPDATE Dec 05 2022: Return for electrical summation guided Dysport injection for spastic left upper extremity, continue help her symptoms, no significant side effect noted,  UPDATE March 16 2023: She did well with previous injection  UPDATE Jun 15 2023: She did well with previous injection  UPDATE Sep 14 2023: Patient did help relax in her left shoulder, better range of motion, can put on deodorant  Physical exam  Vitals: BP 115/69   Pulse 89  Last Weight:  Wt Readings from Last 1 Encounters:  07/12/23 142 lb 14.4 oz (64.8 kg)   Last Height:   Ht Readings from Last 1 Encounters:  07/12/23 5\' 2"  (1.575 m)   PHYSICAL EXAMINATOINS:    Motor: spastic left hemiparesis,    left lower extremity moderate spasticity, left hip flexion 4, knee flexion 4, extension 5,  Significant left upper extremity spasticity, left shoulder adduction, internal rotation.left elbow flexion, pronation, with passive movement, she has full range of motion of left elbow, left wrist forceful flexion, well-healed left forearm volar surface scar, left hand stayed in Position, finger flexion,thumb in position, ,limited range of motion of left finger extension  Gait: Rising up from seated position by pushing on chair arm,spastic left hemi-circumferential gait.  Assessment and plan:   73 year old right-handed Caucasian female, status post stroke, with spastic left hemiparesis since 1999, responded very well to previous EMG guided botulism toxin injection, return to clinic for repeat injection  Under electric stimulation, 500 units of Dysport A x 3  =1500 units total, Will change to Dysport 500 units x2 for next injection  (500 units dysport was  dissolved into 2.5cc of NS)  Left flexor digitorum profundus  0.5cc  Left palmaris longus 0.5cc Left flexor digitorum superficialis 0.5 cc Left flexor digitorum ulnaris 0.5 cc  Left pectoralis major 0.5ccx  5=2.5c Left latissimus dorsi 0.5 ccx 5=2.5cc Left teres major 0.5 cc    [name], M.D. Ph.D.  [facility] [address] [place], [place] [address] Phone: [phone] Fax:      [phone]

## 2023-09-14 NOTE — Progress Notes (Signed)
Dysport 500 units x 3 vial  Ndc-[address]-1 [id number] Exp-05/02/2028 B/B  Bacteriostatic 0.9% Sodium Chloride- 7.5 mL  Lot: [id number] Expiration: 06/03/2024 NDC: 9147-8295-62 Dx: Z30.865 WITNESSED HQ:[id number] F RMA

## 2023-09-14 NOTE — Telephone Encounter (Signed)
Noted

## 2023-09-14 NOTE — Telephone Encounter (Signed)
Please decrease dysport dosage to 500 units x2 (instead of x3) at next injection in May 2025

## 2023-10-26 ENCOUNTER — Encounter: Payer: Medicare HMO | Admitting: *Deleted

## 2023-10-26 DIAGNOSIS — Z006 Encounter for examination for normal comparison and control in clinical research program: Secondary | ICD-10-CM

## 2023-10-26 MED ORDER — STUDY - ORION 4 - INCLISIRAN 300 MG/1.5 ML OR PLACEBO SQ INJECTION (PI-STUCKEY)
300.0000 mg | INJECTION | SUBCUTANEOUS | Status: DC
  Administered 2023-10-26: 300 mg via SUBCUTANEOUS
  Filled 2023-10-26: qty 1.5

## 2023-10-26 NOTE — Research (Signed)
 [facility] 4  Patient doing well.  No chest pains or short of breath  Few med changes updated in edc  IP given Will see patient back 04/25/2024 @ 0900   Current Outpatient Medications:    AbobotulinumtoxinA (DYSPORT) 500 units SOLR injection, Inject 1,500 Units into the muscle every 3 (three) months., Disp: , Rfl:    ALPRAZolam (XANAX) 0.5 MG tablet, Take 0.5 mg by mouth at bedtime., Disp: , Rfl:    aspirin 81 MG tablet, Take 81 mg by mouth daily., Disp: , Rfl:    cetirizine (ZYRTEC) 10 MG tablet, Take 10 mg by mouth daily as needed for allergies., Disp: , Rfl:    Cholecalciferol (VITAMIN D3) 1000 units CAPS, Take 1,000 Units by mouth daily., Disp: , Rfl:    clopidogrel (PLAVIX) 75 MG tablet, Take 1 tablet (75 mg total) by mouth daily., Disp: 90 tablet, Rfl: 3   DENTA 5000 PLUS 1.1 % CREA dental cream, Place 1 application onto teeth at bedtime. , Disp: , Rfl: 4   diclofenac Sodium (VOLTAREN) 1 % GEL, Apply 4 g topically 3 (three) times daily as needed (pain)., Disp: , Rfl:    diphenhydrAMINE (BENADRYL) 12.5 MG/5ML liquid, Take 12.5 mg by mouth 4 (four) times daily as needed for allergies., Disp: , Rfl:    DULoxetine (CYMBALTA) 30 MG capsule, TAKE 1 CAPSULE DAILY, Disp: 90 capsule, Rfl: 3   DULoxetine (CYMBALTA) 60 MG capsule, Take 1 capsule (60 mg total) by mouth at bedtime., Disp: 90 capsule, Rfl: 3   gabapentin (NEURONTIN) 600 MG tablet, Take 600 mg by mouth 2 (two) times daily., Disp: , Rfl:    guaiFENesin (MUCINEX) 600 MG 12 hr tablet, Take 600 mg by mouth 2 (two) times daily as needed for cough or to loosen phlegm., Disp: , Rfl:    hydrocortisone (ANUSOL-HC) 2.5 % rectal cream, Place 1 Application rectally 2 (two) times daily. For rectal itching, burning, bleeding, Disp: 30 g, Rfl: 1   ibandronate (BONIVA) 150 MG tablet, Take 150 mg by mouth every 30 (thirty) days. Take in the morning with a full glass of water, on an empty stomach, and do not take anything else by mouth or lie down for the  next 30 min., Disp: , Rfl:    levothyroxine (SYNTHROID, LEVOTHROID) 75 MCG tablet, Take 75 mcg by mouth daily before breakfast., Disp: , Rfl:    lubiprostone (AMITIZA) 24 MCG capsule, Take 1 capsule (24 mcg total) by mouth 2 (two) times daily with a meal., Disp: 180 capsule, Rfl: 3   NUCYNTA 50 MG tablet, Take 50 mg by mouth in the morning, at noon, and at bedtime., Disp: , Rfl: 0   NUCYNTA ER 50 MG 12 hr tablet, Take 50 mg by mouth every 12 (twelve) hours. , Disp: , Rfl: 0   pantoprazole (PROTONIX) 40 MG tablet, Take 40 mg by mouth daily as needed (for acid reflux)., Disp: , Rfl:    ranolazine (RANEXA) 500 MG 12 hr tablet, TAKE 1 TABLET DAILY, Disp: 90 tablet, Rfl: 2   rOPINIRole (REQUIP) 2 MG tablet, Take 2 mg by mouth at bedtime., Disp: , Rfl:    sennosides-docusate sodium (SENOKOT-S) 8.6-50 MG tablet, Take 2 tablets by mouth daily. As needed, Disp: , Rfl:    Study - [facility] 4 - inclisiran 300 mg/1.62mL or placebo SQ injection (PI-Stuckey), Inject 300 mg into the skin every 6 (six) months., Disp: , Rfl:   Current Facility-Administered Medications:    AbobotulinumtoxinA (DYSPORT) 500 units injection 1,500  Units, 1,500 Units, Intramuscular, Q90 days, [name], MD, 1,500 Units at 03/16/23 1348   AbobotulinumtoxinA (DYSPORT) 500 units injection 500 Units, 500 Units, Intramuscular, Q90 days, [name], MD, 500 Units at 12/16/22 1033   Study - [facility] 4 - inclisiran 300 mg/1.56mL or placebo SQ injection (PI-Stuckey), 300 mg, Subcutaneous, Q6 months, , 300 mg at 10/26/23 [phone]

## 2023-11-10 ENCOUNTER — Other Ambulatory Visit: Payer: Self-pay | Admitting: Family Medicine

## 2023-11-10 DIAGNOSIS — Z1231 Encounter for screening mammogram for malignant neoplasm of breast: Secondary | ICD-10-CM

## 2023-12-07 ENCOUNTER — Encounter (HOSPITAL_BASED_OUTPATIENT_CLINIC_OR_DEPARTMENT_OTHER): Payer: Self-pay | Admitting: Cardiovascular Disease

## 2023-12-10 ENCOUNTER — Encounter (HOSPITAL_COMMUNITY): Payer: Self-pay

## 2023-12-11 ENCOUNTER — Observation Stay (HOSPITAL_COMMUNITY)
Admission: EM | Admit: 2023-12-11 | Discharge: 2023-12-11 | Discharge status: Against Medical Advice | Attending: Internal Medicine | Admitting: Internal Medicine

## 2023-12-11 ENCOUNTER — Other Ambulatory Visit: Payer: Self-pay

## 2023-12-11 ENCOUNTER — Encounter (HOSPITAL_COMMUNITY): Payer: Self-pay | Admitting: Emergency Medicine

## 2023-12-11 ENCOUNTER — Emergency Department (HOSPITAL_COMMUNITY)

## 2023-12-11 DIAGNOSIS — Z7982 Long term (current) use of aspirin: Secondary | ICD-10-CM | POA: Insufficient documentation

## 2023-12-11 DIAGNOSIS — Z7902 Long term (current) use of antithrombotics/antiplatelets: Secondary | ICD-10-CM | POA: Insufficient documentation

## 2023-12-11 DIAGNOSIS — E039 Hypothyroidism, unspecified: Secondary | ICD-10-CM | POA: Diagnosis not present

## 2023-12-11 DIAGNOSIS — I25110 Atherosclerotic heart disease of native coronary artery with unstable angina pectoris: Secondary | ICD-10-CM | POA: Insufficient documentation

## 2023-12-11 DIAGNOSIS — Z8673 Personal history of transient ischemic attack (TIA), and cerebral infarction without residual deficits: Secondary | ICD-10-CM | POA: Insufficient documentation

## 2023-12-11 DIAGNOSIS — Z87891 Personal history of nicotine dependence: Secondary | ICD-10-CM | POA: Diagnosis not present

## 2023-12-11 DIAGNOSIS — M792 Neuralgia and neuritis, unspecified: Secondary | ICD-10-CM | POA: Diagnosis not present

## 2023-12-11 DIAGNOSIS — K219 Gastro-esophageal reflux disease without esophagitis: Secondary | ICD-10-CM | POA: Insufficient documentation

## 2023-12-11 DIAGNOSIS — R0789 Other chest pain: Secondary | ICD-10-CM | POA: Diagnosis present

## 2023-12-11 DIAGNOSIS — I200 Unstable angina: Principal | ICD-10-CM

## 2023-12-11 DIAGNOSIS — R079 Chest pain, unspecified: Secondary | ICD-10-CM

## 2023-12-11 DIAGNOSIS — R0602 Shortness of breath: Secondary | ICD-10-CM | POA: Diagnosis not present

## 2023-12-11 DIAGNOSIS — Z79899 Other long term (current) drug therapy: Secondary | ICD-10-CM | POA: Diagnosis not present

## 2023-12-11 LAB — BASIC METABOLIC PANEL WITH GFR
Anion gap: 8 (ref 5–15)
BUN: 12 mg/dL (ref 8–23)
CO2: 26 mmol/L (ref 22–32)
Calcium: 9.2 mg/dL (ref 8.9–10.3)
Chloride: 103 mmol/L (ref 98–111)
Creatinine, Ser: 0.71 mg/dL (ref 0.44–1.00)
GFR, Estimated: >60 mL/min (ref >60)
Glucose, Bld: 98 mg/dL (ref 70–99)
Potassium: 4.3 mmol/L (ref 3.5–5.1)
Sodium: 137 mmol/L (ref 135–145)

## 2023-12-11 LAB — CBC
HCT: 44.8 % (ref 36.0–46.0)
Hemoglobin: 14.7 g/dL (ref 12.0–15.0)
MCH: 32.7 pg (ref 26.0–34.0)
MCHC: 32.8 g/dL (ref 30.0–36.0)
MCV: 99.6 fL (ref 80.0–100.0)
Platelets: 301 10*3/uL (ref 150–400)
RBC: 4.5 MIL/uL (ref 3.87–5.11)
RDW: 12.4 % (ref 11.5–15.5)
WBC: 6.7 10*3/uL (ref 4.0–10.5)
nRBC: 0 % (ref 0.0–0.2)

## 2023-12-11 LAB — TSH: TSH: 1.59 u[IU]/mL (ref 0.350–4.500)

## 2023-12-11 LAB — TROPONIN I (HIGH SENSITIVITY)
Troponin I (High Sensitivity): 3 ng/L (ref <18)
Troponin I (High Sensitivity): 5 ng/L (ref <18)

## 2023-12-11 LAB — BRAIN NATRIURETIC PEPTIDE: B Natriuretic Peptide: 15.6 pg/mL (ref 0.0–100.0)

## 2023-12-11 MED ORDER — ASPIRIN 81 MG PO CHEW
324.0000 mg | CHEWABLE_TABLET | Freq: Once | ORAL | Status: AC
  Administered 2023-12-11: 324 mg via ORAL
  Filled 2023-12-11: qty 4

## 2023-12-11 MED ORDER — SENNOSIDES-DOCUSATE SODIUM 8.6-50 MG PO TABS: 1.0000 | ORAL_TABLET | Freq: Every evening | ORAL | Status: DC | PRN

## 2023-12-11 MED ORDER — NITROGLYCERIN 0.4 MG SL SUBL: 0.4000 mg | SUBLINGUAL_TABLET | Freq: Once | SUBLINGUAL | Status: DC

## 2023-12-11 MED ORDER — PANTOPRAZOLE SODIUM 40 MG PO TBEC: 40.0000 mg | DELAYED_RELEASE_TABLET | Freq: Every day | ORAL | 0 refills | Status: DC

## 2023-12-11 MED ORDER — ENOXAPARIN SODIUM 40 MG/0.4ML IJ SOSY: 40.0000 mg | PREFILLED_SYRINGE | INTRAMUSCULAR | Status: DC

## 2023-12-11 NOTE — Discharge Summary (Signed)
 Name: [name] MRN: [id number] DOB: [birth year] 73 y.o. PCP: Fredick Jarred, PA-C  Date of Admission: 12/11/2023  9:24 AM Date of Discharge: 12/11/2023 Attending Physician: Dr. Jarvis Mesa  Discharge Diagnosis: Principal Problem:   Chest pain    Discharge Medications: Allergies as of 12/11/2023       Reactions   Morphine Other (See Comments)   Sick on stomach   Penicillins Rash, Other (See Comments)      Rosuvastatin  Other (See Comments)   myalgias   Zolpidem Tartrate Other (See Comments)   Sleep walk        Medication List     TAKE these medications    ALPRAZolam 0.5 MG tablet Commonly known as: XANAX Take 0.5 mg by mouth at bedtime.   aspirin  81 MG tablet Take 81 mg by mouth daily.   cetirizine 10 MG tablet Commonly known as: ZYRTEC Take 10 mg by mouth daily as needed for allergies.   clopidogrel  75 MG tablet Commonly known as: PLAVIX  Take 1 tablet (75 mg total) by mouth daily.   Denta 5000 Plus 1.1 % Crea dental cream Generic drug: sodium fluoride Place 1 application onto teeth at bedtime.   diclofenac Sodium 1 % Gel Commonly known as: VOLTAREN Apply 4 g topically 3 (three) times daily as needed (pain).   diphenhydrAMINE 12.5 MG/5ML liquid Commonly known as: BENADRYL Take 12.5 mg by mouth 4 (four) times daily as needed for allergies.   DULoxetine  60 MG capsule Commonly known as: CYMBALTA  Take 1 capsule (60 mg total) by mouth at bedtime.   DULoxetine  30 MG capsule Commonly known as: CYMBALTA  TAKE 1 CAPSULE DAILY   Dysport  500 units Solr injection Generic drug: AbobotulinumtoxinA  Inject 1,500 Units into the muscle every 3 (three) months.   gabapentin 600 MG tablet Commonly known as: NEURONTIN Take 600 mg by mouth 2 (two) times daily.   guaiFENesin 600 MG 12 hr tablet Commonly known as: MUCINEX Take 600 mg by mouth 2 (two) times daily as needed for cough or to loosen phlegm.   hydrocortisone  2.5 % rectal cream Commonly known as:  ANUSOL -HC Place 1 Application rectally 2 (two) times daily. For rectal itching, burning, bleeding   ibandronate 150 MG tablet Commonly known as: BONIVA Take 150 mg by mouth every 30 (thirty) days. Take in the morning with a full glass of water, on an empty stomach, and do not take anything else by mouth or lie down for the next 30 min.   levothyroxine 75 MCG tablet Commonly known as: SYNTHROID Take 75 mcg by mouth daily before breakfast.   lubiprostone  24 MCG capsule Commonly known as: AMITIZA  Take 1 capsule (24 mcg total) by mouth 2 (two) times daily with a meal.   Nucynta ER 50 MG 12 hr tablet Generic drug: tapentadol Take 50 mg by mouth every 12 (twelve) hours.   Nucynta 50 MG tablet Generic drug: tapentadol Take 50 mg by mouth in the morning, at noon, and at bedtime.   [facility] 4 inclisiran or placebo 300 mg/1.5 mL SQ injection Inject 300 mg into the skin every 6 (six) months.   pantoprazole 40 MG tablet Commonly known as: PROTONIX Take 1 tablet (40 mg total) by mouth daily. What changed:  when to take this reasons to take this   ranolazine  500 MG 12 hr tablet Commonly known as: RANEXA  TAKE 1 TABLET DAILY   rOPINIRole 2 MG tablet Commonly known as: REQUIP Take 2 mg by mouth at bedtime.   sennosides-docusate sodium 8.6-50  MG tablet Commonly known as: SENOKOT-S Take 2 tablets by mouth daily. As needed   Vitamin D3 25 MCG (1000 UT) Caps Take 1,000 Units by mouth daily.        Disposition and follow-up:   Ms.Jury G Lagman was discharged from [facility] in Stable condition.  At the hospital follow up visit please address:  1.  Follow-up: with cardiology 2.  Labs / imaging needed at time of follow-up: none 3.  Pending labs/ test needing follow-up: none 4.  Medication Changes: change Protonix from prn to daily  Follow-up Appointments:  Follow-up Information     Maudine Sos, MD Follow up.   Specialty: Cardiology Why: Office will call  you for your followup appointment Contact information: [address] [place] [address] [phone]         Call  Fredick Jarred, PA-C.   Specialty: Family Medicine Why: Follow up from ER visit Contact information: [address] [place] [address] [phone]         Go to  [facility] Emergency Department at [facility].   Specialty: Emergency Medicine Why: As needed, If symptoms worsen Contact information: [address] [place]  [address] [phone]                Hospital Course by problem list: [name] is a 73 y.o. 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 Cardiac evaluation on the day of admission was negative - not felt to be cardiac pain and no plans for intervention - and patient was discharged.  Chest pain Intermittent chest pain which patient states similar to prior MI in 2007. Cardiology consulted. Normal serial troponin and no acute ischemic changes on EKG. Chest pain has improved. Discussed with cardiology PA. Not felt to be cardiac origin. No indication for inpatient admission or ischemic evaluation. She will follow-up outpatient. - Rec scheduled protonix (was prn prior)   GERD -PPI - scheduled now   Hx CVA with residual left sided weakness Chronic nerve pain  No new deficits, stable. Rec continue home regimen of: -home ASA and Plavix  -clinical study for HLD -home pain regimen    Hypothyroidism TSH normal.  -Continue home levothyroxine 75 mcg    Discharge Subjective: She is without chest pain since this morning. She is relieved by her otherwise negative workup in the ED. She is in agreement with cardiology's assessment for discharge.  Discharge Exam:   BP 128/84   Pulse 73   Temp 98.1 F (36.7 C) (Oral)   Resp 19   Ht 5\' 2"  (1.575 m)   Wt 63 kg   SpO2 100%   BMI 25.42  kg/m  Constitutional: well-appearing woman sitting in bed, in no acute distress HENT: normocephalic atraumatic, mucous membranes moist Eyes: conjunctiva non-erythematous Neck: supple Cardiovascular: regular rate and rhythm, no m/r/g Pulmonary/Chest: normal work of breathing on room air, lungs clear to auscultation bilaterally Abdominal: soft, non-tender, non-distended MSK: normal bulk and tone. Chronic contractures and weakness of L arm. L foot in brace. Neurological: alert & oriented x 3 Skin: warm and dry Psych: Normal mood and affect   Pertinent Labs, Studies, and Procedures:     Latest Ref Rng & Units 12/11/2023    9:30 AM 04/23/2020   11:05 AM 11/17/2017    3:02 PM  CBC  WBC 4.0 - 10.5 K/uL 6.7  6.1  7.7   Hemoglobin 12.0 - 15.0 g/dL 09.8  11.9  14.7   Hematocrit 36.0 - 46.0 % 44.8  41.8  41.0   Platelets 150 - 400 K/uL 301  278  307        Latest Ref Rng & Units 12/11/2023    9:30 AM 07/23/2020    1:36 PM 04/23/2020   11:05 AM  CMP  Glucose 70 - 99 mg/dL 98  82  94   BUN 8 - 23 mg/dL 12  11  14    Creatinine 0.44 - 1.00 mg/dL 8.29  5.62  1.30   Sodium 135 - 145 mmol/L 137  136  138   Potassium 3.5 - 5.1 mmol/L 4.3  5.5  4.4   Chloride 98 - 111 mmol/L 103  102  100   CO2 22 - 32 mmol/L 26  25  28    Calcium  8.9 - 10.3 mg/dL 9.2  8.9  9.2   Total Protein 6.0 - 8.5 g/dL   7.0   Total Bilirubin 0.0 - 1.2 mg/dL   0.4   Alkaline Phos 44 - 121 IU/L   61   AST 0 - 40 IU/L   21   ALT 0 - 32 IU/L   19     DG Chest 2 View Result Date: 12/11/2023 CLINICAL DATA:  Chest pain EXAM: CHEST - 2 VIEW COMPARISON:  Chest x-ray performed May 26, 2022 FINDINGS: Mild hypoventilatory changes. No focal infiltrate. No pleural effusion or pneumothorax. Heart mediastinum are unchanged. IMPRESSION: No active cardiopulmonary disease. Electronically Signed   By: Reagan Camera M.D.   On: 12/11/2023 10:42   Signed: Carleen Chary, DO 12/11/2023, 4:48 PM   Pager: [phone]

## 2023-12-11 NOTE — Progress Notes (Signed)
 Dr. Carolynne Citron indicates he spoke with EDP about patient being DC with outpatient f/u. I have sent a message to our office's scheduling team requesting a follow-up appointment, and our office will call the patient with this information. He did not feel strongly about scheduling an echo ahead of time, can be discussed in follow-up. Also updated Dr. Jari Merles who had originally been called to admit pt.

## 2023-12-11 NOTE — ED Notes (Signed)
 Patient verbalized understanding of discharge instructions, Patient was wheeled out to ED lobby, Patient does not have any other concerns at this time.

## 2023-12-11 NOTE — ED Provider Notes (Signed)
 [place] EMERGENCY DEPARTMENT AT  HOSPITAL Provider Note  CSN: [id number] Arrival date & time: 12/11/23 4098  Chief Complaint(s) Chest Pain  HPI [name] is a 73 y.o. female with past medical history as below, significant for CVA with left sided spastic hemiplegia, MI 2007 w/ failed PCI, unstable angina who presents to the ED with complaint of  chest pain.   She reports intermittent chest pressure at rest over the past week.  Described as a heaviness, pressure sensation, feels like something sitting on her chest.  Radiation to her neck.  Initially began around a week ago resolved spontaneously.  She has had intermittent reoccurrence over the past week.  She has had worsening exertional dyspnea with exercise.  She was awakened around 6 AM today with crushing chest pain, felt like someone was sitting on her chest, radiating to her neck.  No dyspnea.  No palpitations or syncope.  Pain has modestly improved since the onset.  She has not taken her morning medications.  Follows with Dr. Theodis Fiscal cardiology  Past Medical History Past Medical History:  Diagnosis Date   AMI (acute myocardial infarction) (HCC)    a. 2007 - failed PCI to the distal LAD - managed medically. b. relook cath 2008 - no significant CAD; previous LAD stenosis resolved/recanalized.   Chronic pain    Complication of anesthesia    CVA (cerebral vascular accident) (HCC) 1999   Degenerative disc disease, cervical    Hypothyroidism    PONV (postoperative nausea and vomiting)    Unstable angina [facility])    Patient Active Problem List   Diagnosis Date Noted   Closed fracture of lower end of radius with ulna 06/05/2019   Chronic constipation 05/29/2019   Spastic hemiplegia of left nondominant side as late effect of cerebral infarction (HCC) 04/12/2019   Chest pain 03/17/2019   Influenza with respiratory manifestation 10/12/2018   Hemorrhoids 05/16/2018   Hordeolum internum 03/21/2018   Rectal bleeding  11/17/2017   Therapeutic opioid induced constipation 11/17/2017   Fatty stool 11/17/2017   Osteoporosis 10/06/2017   Closed fracture of shaft of metacarpal bone 08/24/2017   Intestinal infections due to other organisms 07/01/2017   Neuropathic pain 04/14/2017   Allergic rhinitis 10/08/2016   Contusion of lower leg 10/08/2016   Contusion of chest wall 07/22/2016   Acute maxillary sinusitis 03/27/2016   Spastic hemiplegia affecting left nondominant side (HCC) 06/13/2014   Other depressive disorder 12/19/2013   Cerebrovascular accident (CVA) (HCC) 06/13/2013   Esophageal reflux 06/13/2013   Pure hypercholesterolemia 06/13/2013   Pain in soft tissues of limb 03/24/2013   Restless legs syndrome 12/20/2012   Head injury 03/24/2012   Other and unspecified hyperlipidemia 09/10/2009   CAD, NATIVE VESSEL 09/10/2009   HYPOTHYROIDISM 01/01/2009   Cerebral artery occlusion with cerebral infarction (HCC) 01/01/2009   Home Medication(s) Prior to Admission medications   Medication Sig Start Date End Date Taking? Authorizing Provider  AbobotulinumtoxinA  (DYSPORT ) 500 units SOLR injection Inject 1,500 Units into the muscle every 3 (three) months.    [provider]  ALPRAZolam (XANAX) 0.5 MG tablet Take 0.5 mg by mouth at bedtime.    [provider]  aspirin  81 MG tablet Take 81 mg by mouth daily.    [provider]  cetirizine (ZYRTEC) 10 MG tablet Take 10 mg by mouth daily as needed for allergies.    [provider]  Cholecalciferol (VITAMIN D3) 1000 units CAPS Take 1,000 Units by mouth daily.  [provider]  clopidogrel  (PLAVIX ) 75 MG tablet Take 1 tablet (75 mg total) by mouth daily. 12/26/10   Wall, Craige Dixon, MD  DENTA 5000 PLUS 1.1 % CREA dental cream Place 1 application onto teeth at bedtime.  06/12/16   [provider]  diclofenac Sodium (VOLTAREN) 1 % GEL Apply 4 g topically 3 (three) times daily as needed (pain). 03/27/20   [provider]  diphenhydrAMINE (BENADRYL) 12.5 MG/5ML liquid Take 12.5 mg by mouth 4 (four) times daily as needed for allergies.    [provider]  DULoxetine  (CYMBALTA ) 30 MG capsule TAKE 1 CAPSULE DAILY 08/30/23   Phebe Brasil, MD  DULoxetine  (CYMBALTA ) 60 MG capsule Take 1 capsule (60 mg total) by mouth at bedtime. 09/14/22   Phebe Brasil, MD  gabapentin (NEURONTIN) 600 MG tablet Take 600 mg by mouth 2 (two) times daily.    [provider]  guaiFENesin (MUCINEX) 600 MG 12 hr tablet Take 600 mg by mouth 2 (two) times daily as needed for cough or to loosen phlegm.    [provider]  hydrocortisone  (ANUSOL -HC) 2.5 % rectal cream Place 1 Application rectally 2 (two) times daily. For rectal itching, burning, bleeding 05/25/23   Delman Ferns, NP  ibandronate (BONIVA) 150 MG tablet Take 150 mg by mouth every 30 (thirty) days. Take in the morning with a full glass of water, on an empty stomach, and do not take anything else by mouth or lie down for the next 30 min.    [provider]  levothyroxine (SYNTHROID, LEVOTHROID) 75 MCG tablet Take 75 mcg by mouth daily before breakfast.    [provider]  lubiprostone  (AMITIZA ) 24 MCG capsule Take 1 capsule (24 mcg total) by mouth 2 (two) times daily with a meal. 05/25/23   Delman Ferns, NP  NUCYNTA 50 MG tablet Take 50 mg by mouth in the morning, at noon, and at bedtime. 05/10/18   [provider]  NUCYNTA ER 50 MG 12 hr tablet Take 50 mg by mouth every 12 (twelve) hours.  04/30/16   [provider]  pantoprazole (PROTONIX) 40 MG tablet Take 40 mg by mouth daily as needed (for acid reflux).    [provider]  ranolazine  (RANEXA ) 500 MG 12 hr tablet TAKE 1 TABLET DAILY 04/20/23   Maudine Sos, MD  rOPINIRole (REQUIP) 2 MG tablet Take 2 mg by mouth at bedtime.    [provider]  sennosides-docusate sodium (SENOKOT-S) 8.6-50 MG tablet Take 2 tablets by mouth daily. As needed     [provider]  Study - ORION 4 - inclisiran 300 mg/1.39mL or placebo SQ injection (PI-Stuckey) Inject 300 mg into the skin every 6 (six) months. 07/22/18   [provider]  Past Surgical History Past Surgical History:  Procedure Laterality Date   COLONOSCOPY WITH PROPOFOL  N/A 05/24/2018   normal TI, significant looping of the colon, and external hemorrhoids.   ENDOMETRIAL ABLATION  3/06   left ankle surgery     OPEN REDUCTION INTERNAL FIXATION (ORIF) DISTAL RADIAL FRACTURE Left 07/23/2020   Procedure: 1.  Removal left distal radius volar plate 2.  Open reduction internal fixation left extra-articular radial shaft fracture 3.  Closed treatment distal ulnar shaft fracture ;  Surgeon: Brunilda Capra, MD;  Location: [facility] OR;  Service: Orthopedics;  Laterality: Left;   Family History Family History  Problem Relation Age of Onset   Arrhythmia Father        PPM   Colon cancer Neg Hx    Breast cancer Neg Hx     Social History Social History   Tobacco Use   Smoking status: Former   Smokeless tobacco: Never   Tobacco comments:    Quit in 2005  Vaping Use   Vaping status: Never Used  Substance Use Topics   Alcohol  use: Yes    Comment: social   Drug use: No   Allergies Morphine, Rosuvastatin , Zolpidem tartrate, and Penicillins  Review of Systems A thorough review of systems was obtained and all systems are negative except as noted in the HPI and PMH.   Physical Exam Vital Signs  I have reviewed the triage vital signs BP 137/86   Pulse 74   Temp 98.3 F (36.8 C)   Resp 19   Ht 5\' 2"  (1.575 m)   Wt 63 kg   SpO2 99%   BMI 25.42 kg/m  Physical Exam Vitals and nursing note reviewed.  Constitutional:      General: She is not in acute distress.    Appearance: Normal appearance.  HENT:     Head: Normocephalic and atraumatic.      Right Ear: External ear normal.     Left Ear: External ear normal.     Nose: Nose normal.     Mouth/Throat:     Mouth: Mucous membranes are moist.  Eyes:     General: No scleral icterus.       Right eye: No discharge.        Left eye: No discharge.  Cardiovascular:     Rate and Rhythm: Normal rate and regular rhythm.     Pulses: Normal pulses.     Heart sounds: Normal heart sounds.  Pulmonary:     Effort: Pulmonary effort is normal. No respiratory distress.     Breath sounds: Normal breath sounds. No stridor.  Abdominal:     General: Abdomen is flat. There is no distension.     Palpations: Abdomen is soft.     Tenderness: There is no abdominal tenderness.  Musculoskeletal:     Cervical back: No rigidity.     Right lower leg: No edema.     Left lower leg: No edema.  Skin:    General: Skin is warm and dry.     Capillary Refill: Capillary refill takes less than 2 seconds.  Neurological:     Mental Status: She is alert and oriented to person, place, and time.     GCS: GCS eye subscore is 4. GCS verbal subscore is 5. GCS motor subscore is 6.     Comments: LUE spastic hemiplegia  Psychiatric:        Mood and Affect: Mood normal.        Behavior: Behavior normal.  Behavior is cooperative.     ED Results and Treatments Labs (all labs ordered are listed, but only abnormal results are displayed) Labs Reviewed  BASIC METABOLIC PANEL WITH GFR  CBC  BRAIN NATRIURETIC PEPTIDE  TSH  TROPONIN I (HIGH SENSITIVITY)  TROPONIN I (HIGH SENSITIVITY)                                                                                                                          Radiology DG Chest 2 View Result Date: 12/11/2023 CLINICAL DATA:  Chest pain EXAM: CHEST - 2 VIEW COMPARISON:  Chest x-ray performed May 26, 2022 FINDINGS: Mild hypoventilatory changes. No focal infiltrate. No pleural effusion or pneumothorax. Heart mediastinum are unchanged. IMPRESSION: No active cardiopulmonary  disease. Electronically Signed   By: Reagan Camera M.D.   On: 12/11/2023 10:42    Pertinent labs & imaging results that were available during my care of the patient were reviewed by me and considered in my medical decision making (see MDM for details).  Medications Ordered in ED Medications  nitroGLYCERIN  (NITROSTAT ) SL tablet 0.4 mg (0.4 mg Sublingual Not Given 12/11/23 1145)  aspirin  chewable tablet 324 mg (324 mg Oral Given 12/11/23 1050)                                                                                                                                     Procedures Procedures  (including critical care time)  Medical Decision Making / ED Course    Medical Decision Making:    AASHVI RUSSELL is a 73 y.o. female with past medical history as below, significant for CVA with left sided spastic hemiplegia, MI 2007 w/ failed PCI, unstable angina who presents to the ED with complaint of  chest pain. . The complaint involves an extensive differential diagnosis and also carries with it a high risk of complications and morbidity.  Serious etiology was considered. Ddx includes but is not limited to: Differential includes all life-threatening causes for chest pain. This includes but is not exclusive to acute coronary syndrome, aortic dissection, pulmonary embolism, cardiac tamponade, community-acquired pneumonia, pericarditis, musculoskeletal chest wall pain, etc.   Complete initial physical exam performed, notably the patient was in no distress, still having some mild chest discomfort/pressure.    Reviewed and confirmed nursing documentation for past medical history, family history, social history.  Vital signs reviewed.     Brief  summary:  73 year old female history as above including CVA, prior MI, unstable angina here with chest pain. Woke her up from sleep around 6 AM, has improved but still symptomatic Worsening exertional dyspnea over the past week  Clinical Course as of  12/11/23 1346  Sat Dec 11, 2023  1250 Spoke w/ dr Emmette Harms, will see in consult, recommend medicine admit  [SG]  1346 Trop neg x2, EKG w/o acute ischemic changes [SG]    Clinical Course User Index [SG] Teddi Favors, DO     Presentation concerning for unstable angina.  Her pain is improved since the onset.  Given aspirin .  Workup otherwise is reassuring.  Will admit to IM TS.  Cardiology requested to be on consult             Additional history obtained: -Additional history obtained from spouse -External records from outside source obtained and reviewed including: Chart review including previous notes, labs, imaging, consultation notes including  Prior cardiology documentation Dr. Theodis Fiscal Home medications, prior labs Nuclear stress test 1/17 with no acute ischemic changes, EF 74%   Lab Tests: -I ordered, reviewed, and interpreted labs.   The pertinent results include:   Labs Reviewed  BASIC METABOLIC PANEL WITH GFR  CBC  BRAIN NATRIURETIC PEPTIDE  TSH  TROPONIN I (HIGH SENSITIVITY)  TROPONIN I (HIGH SENSITIVITY)    Notable for labs stable  EKG   EKG Interpretation Date/Time:  Saturday Dec 11 2023 09:30:39 EDT Ventricular Rate:  98 PR Interval:  160 QRS Duration:  66 QT Interval:  324 QTC Calculation: 413 R Axis:   -21  Text Interpretation: Normal sinus rhythm Minimal voltage criteria for LVH, may be normal variant ( R in aVL ) Borderline ECG When compared with ECG of 27-Sep-2006 00:17, PREVIOUS ECG IS PRESENT no stemi Confirmed by Russella Courts ([id number]) on 12/11/2023 10:40:12 AM         Imaging Studies ordered: I ordered imaging studies including cxr I independently visualized the following imaging with scope of interpretation limited to determining acute life threatening conditions related to emergency care; findings noted above I agree with the radiologist interpretation If any imaging was obtained with contrast I closely monitored patient for any possible  adverse reaction a/w contrast administration in the emergency department   Medicines ordered and prescription drug management: Meds ordered this encounter  Medications   aspirin  chewable tablet 324 mg   nitroGLYCERIN  (NITROSTAT ) SL tablet 0.4 mg    -I have reviewed the patients home medicines and have made adjustments as needed   Consultations Obtained: I requested consultation with the cardiology,  and discussed lab and imaging findings as well as pertinent plan - they recommend: admit   Cardiac Monitoring: The patient was maintained on a cardiac monitor.  I personally viewed and interpreted the cardiac monitored which showed an underlying rhythm of: nsr Continuous pulse oximetry interpreted by myself, 99% on RA.    Social Determinants of Health:  Diagnosis or treatment significantly limited by social determinants of health: former smoker   Reevaluation: After the interventions noted above, I reevaluated the patient and found that they have improved  Co morbidities that complicate the patient evaluation  Past Medical History:  Diagnosis Date   AMI (acute myocardial infarction) (HCC)    a. 2007 - failed PCI to the distal LAD - managed medically. b. relook cath 2008 - no significant CAD; previous LAD stenosis resolved/recanalized.   Chronic pain    Complication of anesthesia    CVA (cerebral  vascular accident) (HCC) 1999   Degenerative disc disease, cervical    Hypothyroidism    PONV (postoperative nausea and vomiting)    Unstable angina (HCC)       Dispostion: Disposition decision including need for hospitalization was considered, and patient admitted to the hospital.    Final Clinical Impression(s) / ED Diagnoses Final diagnoses:  Unstable angina (HCC)        Teddi Favors, DO 12/11/23 1346

## 2023-12-11 NOTE — H&P (Signed)
 Date: 12/11/2023               Patient Name:  [name] MRN: [id number]  DOB: 1951-06-16 Age / Sex: 73 y.o., female   PCP: Fredick Jarred, PA-C         Medical Service: Internal Medicine Teaching Service         Attending Physician: Dr. Driscilla George      First Contact: Reida Car  Pager:  [phone]  Second Contact: Jearldine Mina, DO Pager:  [phone]       After Hours  (After 5pm / First Contact Pager: [phone]  weekends / holidays): Second Contact Pager: [phone]   SUBJECTIVE   Chief Complaint: chest pain  History of Present Illness: [name] is a 73 y.o. 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   Presents with chest pain for past week with associated SOB. Intermittent that lasts for 15 mins. Describes as chest pressure. Took nitroglycerin  with improvement. Pain noted during exercise earlier this week. States she bikes frequently and has biked without pain this week as well. Denies abdominal pain, n/v, urinary symptoms, fever or chills, sick contacts, palpitations. Hx of MI (2007) s/p failed PCI of distal LAD and CVA (1989) with left sided deficits. Chronic pain after stroke. Follows with Dr. Theodis Fiscal outpatient cardiology.   Currently chest pain has improved/resolved and no SOB.    ED Course: Vitals were hemodynamically stable on room air.  Labs significant for normal troponin 3>>5, unremarkable CBC and BMP, normal BNP and TSH.  Imaging: CXR without acute changes  Received ASA 324 mg.  ED physician consulted cardiology for unstable angina.   Meds:  -Dysport  injection  -Xanax 0.5 mg at bedtime -ASA 81 mg  -vitamin D -Plavix  75 mg -duloxetine  90 mg  -gabapentin 300 mg in morning, 300 mg in afternoon and 600 mg at bedtime  -Boniva monthly  -levothyroxine 75 mcg in AM -lubiprostone  24 mcg BID -Nucynta XR 50 mg BID, breakthrough Nucynta 50 mg -ranolazine  500 mg daily -ropinirole 2 mg at bedtime    Current Facility-Administered Medications for the 12/11/23 encounter [facility] Encounter)  Medication   AbobotulinumtoxinA  (DYSPORT ) 500 units injection 1,500 Units   AbobotulinumtoxinA  (DYSPORT ) 500 units injection 500 Units   Study - ORION 4 - inclisiran 300 mg/1.5mL or placebo SQ injection (PI-Stuckey)   No outpatient medications have been marked as taking for the 12/11/23 encounter [facility] Encounter).    Past Medical History Past Medical History:  Diagnosis Date   AMI (acute myocardial infarction) (HCC)    a. 2007 - failed PCI to the distal LAD - managed medically. b. relook cath 2008 - no significant CAD; previous LAD stenosis resolved/recanalized.   Chronic pain    Complication of anesthesia    CVA (cerebral vascular accident) (HCC) 1999   Degenerative disc disease, cervical    Hypothyroidism    PONV (postoperative nausea and vomiting)    Unstable angina [facility]     Past Surgical History Past Surgical History:  Procedure Laterality Date   COLONOSCOPY WITH PROPOFOL  N/A 05/24/2018   normal TI, significant looping of the colon, and external hemorrhoids.   ENDOMETRIAL ABLATION  3/06   left ankle surgery     OPEN REDUCTION INTERNAL FIXATION (ORIF) DISTAL RADIAL FRACTURE Left 07/23/2020   Procedure: 1.  Removal left distal radius volar plate 2.  Open reduction internal fixation left extra-articular radial shaft fracture 3.  Closed treatment distal ulnar shaft fracture ;  Surgeon: Brunilda Capra, MD;  Location: [facility] OR;  Service: Orthopedics;  Laterality: Left;    Social:  Lives With: husband  Support: family  Level of Function: independent with ADL, no driving after stroke  PCP: Fredick Jarred ([facility]) Substances: -Tobacco: denies -Alcohol : occasional glass of wine  -Recreational Drug: denies  Family History:  Family History  Problem Relation Age of Onset   Arrhythmia Father        PPM   Colon cancer Neg Hx    Breast cancer Neg Hx     Allergies: Allergies as of  12/11/2023 - Review Complete 12/11/2023  Allergen Reaction Noted   Morphine Other (See Comments) 01/01/2009   Penicillins Rash and Other (See Comments) 01/01/2009   Rosuvastatin  Other (See Comments) 01/28/2021   Zolpidem tartrate Other (See Comments) 01/01/2009    Review of Systems: A complete ROS was negative except as per HPI.   OBJECTIVE:   Physical Exam: Blood pressure 137/86, pulse 74, temperature 98.3 F (36.8 C), resp. rate 19, height 5\' 2"  (1.575 m), weight 63 kg, SpO2 99%.  Constitutional: alert and oriented x 3, well-appearing woman resting in bed, in no acute distress HENT: normocephalic atraumatic, mucous membranes moist Eyes: conjunctiva non-erythematous Neck: supple Cardiovascular: regular rate and rhythm, no m/r/g Pulmonary/Chest: normal work of breathing on room air, lungs clear to auscultation bilaterally Abdominal: bowel sounds normal, soft, non-tender, non-distended MSK: normal bulk and tone Neurological: alert & oriented x 3 Skin: warm and dry Psych: Normal mood and affect  Labs: CBC    Component Value Date/Time   WBC 6.7 12/11/2023 0930   RBC 4.50 12/11/2023 0930   HGB 14.7 12/11/2023 0930   HGB 14.0 04/23/2020 1105   HCT 44.8 12/11/2023 0930   HCT 41.8 04/23/2020 1105   PLT 301 12/11/2023 0930   PLT 278 04/23/2020 1105   MCV 99.6 12/11/2023 0930   MCV 99 (H) 04/23/2020 1105   MCH 32.7 12/11/2023 0930   MCHC 32.8 12/11/2023 0930   RDW 12.4 12/11/2023 0930   RDW 12.4 04/23/2020 1105   LYMPHSABS 1.8 11/17/2017 1502   EOSABS 0.1 11/17/2017 1502   BASOSABS 0.0 11/17/2017 1502     CMP     Component Value Date/Time   NA 137 12/11/2023 0930   NA 138 04/23/2020 1105   K 4.3 12/11/2023 0930   CL 103 12/11/2023 0930   CO2 26 12/11/2023 0930   GLUCOSE 98 12/11/2023 0930   BUN 12 12/11/2023 0930   BUN 14 04/23/2020 1105   CREATININE 0.71 12/11/2023 0930   CALCIUM  9.2 12/11/2023 0930   PROT 7.0 04/23/2020 1105   ALBUMIN 4.3 04/23/2020 1105    AST 21 04/23/2020 1105   ALT 19 04/23/2020 1105   ALKPHOS 61 04/23/2020 1105   BILITOT 0.4 04/23/2020 1105   GFRNONAA >60 12/11/2023 0930   GFRAA 84 04/23/2020 1105    Imaging:  DG Chest 2 View CLINICAL DATA:  Chest pain  EXAM: CHEST - 2 VIEW  COMPARISON:  Chest x-ray performed May 26, 2022  FINDINGS: Mild hypoventilatory changes. No focal infiltrate. No pleural effusion or pneumothorax. Heart mediastinum are unchanged.  IMPRESSION: No active cardiopulmonary disease.  Electronically Signed   By: Reagan Camera M.D.   On: 12/11/2023 10:42  EKG: personally reviewed my interpretation is sinus rhythm and no acute ischemic changes.   ASSESSMENT & PLAN:   Assessment & Plan by Problem: Principal Problem:   Chest pain  [name] is a 73 y.o. 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 Cardiology consulted. Normal serial troponin and no acute ischemic changes on EKG. Chest pain has improved. Discussed with cardiology PA and will not start heparin yet.  -F/u cardiology recs, appreciate assistance  -Nitroglycerin  SL if needed  -Telemetry   GERD -PPI   Hx CVA with residual left sided weakness Chronic nerve pain  No new deficits, stable.  -Continue home ASA and Plavix  -Currently in clinical study for HLD -Continue home pain regimen   Hypothyroidism TSH normal.  -Continue home levothyroxine 75 mcg    Diet: Heart Healthy VTE: Enoxaparin IVF: None,None Code: Full Surrogate Decision Maker: husband Rich Champ)  Prior to Admission Living Arrangement: Home, living with spouse Anticipated Discharge Location: Home Barriers to Discharge: cardiology evaluation    Dispo: Admit patient to Observation with expected length of stay less than 2  midnights.  Signed: Carleen Chary, DO Internal Medicine Resident PGY-1 12/11/2023, 4:32 PM   Please contact IM Residency On-Call Pager at: [phone] or [phone].

## 2023-12-11 NOTE — Discharge Instructions (Addendum)
 It was a pleasure caring for you today in the emergency department.  Please return to the emergency department for any worsening or worrisome symptoms.

## 2023-12-11 NOTE — Hospital Course (Addendum)
 [name] is a 73 y.o. 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 Cardiac evaluation on the day of admission was negative - not felt to be cardiac pain and no plans for intervention - and patient was discharged.  Chest pain Intermittent chest pain which patient states similar to prior MI in 2007. Cardiology consulted. Normal serial troponin and no acute ischemic changes on EKG. Chest pain has improved. Discussed with cardiology PA. Not felt to be cardiac origin. No indication for inpatient admission or ischemic evaluation. She will follow-up outpatient. - Rec scheduled protonix (was prn prior)   GERD -PPI - scheduled now   Hx CVA with residual left sided weakness Chronic nerve pain  No new deficits, stable. Rec continue home regimen of: -home ASA and Plavix  -clinical study for HLD -home pain regimen    Hypothyroidism TSH normal.  -Continue home levothyroxine 75 mcg

## 2023-12-11 NOTE — Consult Note (Addendum)
 Cardiology Consultation   Patient ID: [name] MRN: [id number]; DOB: Feb 17, 1951  Admit date: 12/11/2023 Date of Consult: 12/11/2023  PCP:  Fredick Jarred, PA-C   [facility] Providers Cardiologist:  Maudine Sos, MD        Patient Profile:   [name] is a 73 y.o. female with a hx of CAD with acute MI 2007 s/p failed attempt at PCI of distal LAD treated medically with normal relook cath 2008, CVA 1989 (maintained on DAPT), HLD with statin intolerance with prior enrollment in [place] trial, chronic pain, mild bilateral carotid artery disease (1-39% BICA 2022), hypothyroidism, degenerative disc disease who is being seen 12/11/2023 for the evaluation of chest pain at the request of Dr. Martina Sledge.  History of Present Illness:   [name] had remote MI/cath in 2007 with 99% LAD, with unsuccessful PCI due to inability to cross the wire, managed medically, EF 60% at that time. Repeat cath done in 2008 actually showed no significant coronary artery disease; the area that was previously occluded had recanalized and she had no residual disease. Noncardiac chest pain was suspected. Echo 2011 was normal (under Notes/CV procedures). She has had episodic breakthrough SOB/CP over the years and has been treated with Ranexa  - in the past the patient wanted to take this PRN but ultimately settled on 1 tablet daily. Nuclear stress test in 2017 showed small, mild intensity, lateral apical region of ischemia in the setting of artifact from extracardiac radiotracer uptake and also arms down imaging, EF 74%, felt reassuring/low risk.   The patient reports the following sequence of symptoms this past week: - Last Sunday: developed vague indigestion-type pressure, took Gas-Ex, Protonix, Tums without significant relief, lasted several hours but not bad enough to seek care or keep from sleeping - Monday: when she woke up, discomfort was still there - went to exercise class and felt like it was harder  to breathe. They gave her cola to drink, she burped, and immediately felt better - Tuesday: had 2 separate episodes of chest discomfort lasting several minutes. There was some confusion over what happened this day. Patient states each episode lasted a few minutes relieved with SL NTG but husband recalls her saying the second episode didn't go away. She doesn't recall a prolonged episode - Wednesday: felt completely fine, went to exercise class and did great - Thursday and Friday: felt fine - This morning awoke at 6am with heaviness in the center of her chest and pain in both shoulder blades. Only thing that made it worse was when X-ray tech asked her to hold her breath, otherwise not worse with inspiration. No SOB, n/v, jaw pain, When she had her MI, her anginal equivalent was severe jaw pain with nausea/vomiting, and this was different. Due to persistent pain she came to ED after about 5 hours of uninterrupted discomfort. Symptoms eased off spontaneously around 11am without specific intervention and have not recurred. Troponins wnl (3, 5), BNP 15.6, CBC, BMET wnl. EKG shows NSR 98bpm, minimal voltage criteria for LVH but otherwise no acute STT changes from prior. She received 324mg  ASA. CXR NAD. Remains pain free and VSS at this time.    Past Medical History:  Diagnosis Date   AMI (acute myocardial infarction) (HCC)    a. 2007 - failed PCI to the distal LAD - managed medically. b. relook cath 2008 - no significant CAD; previous LAD stenosis resolved/recanalized.   Chronic pain    Complication of anesthesia    CVA (  cerebral vascular accident) [facility]) 1999   Degenerative disc disease, cervical    Hypothyroidism    PONV (postoperative nausea and vomiting)    Unstable angina (HCC)     Past Surgical History:  Procedure Laterality Date   COLONOSCOPY WITH PROPOFOL  N/A 05/24/2018   normal TI, significant looping of the colon, and external hemorrhoids.   ENDOMETRIAL ABLATION  3/06   left ankle surgery      OPEN REDUCTION INTERNAL FIXATION (ORIF) DISTAL RADIAL FRACTURE Left 07/23/2020   Procedure: 1.  Removal left distal radius volar plate 2.  Open reduction internal fixation left extra-articular radial shaft fracture 3.  Closed treatment distal ulnar shaft fracture ;  Surgeon: Brunilda Capra, MD;  Location: [facility] OR;  Service: Orthopedics;  Laterality: Left;     Home Medications:  Prior to Admission medications   Medication Sig Start Date End Date Taking? Authorizing Provider  AbobotulinumtoxinA  (DYSPORT ) 500 units SOLR injection Inject 1,500 Units into the muscle every 3 (three) months.    [provider]  ALPRAZolam (XANAX) 0.5 MG tablet Take 0.5 mg by mouth at bedtime.    [provider]  aspirin  81 MG tablet Take 81 mg by mouth daily.    [provider]  cetirizine (ZYRTEC) 10 MG tablet Take 10 mg by mouth daily as needed for allergies.    [provider]  Cholecalciferol (VITAMIN D3) 1000 units CAPS Take 1,000 Units by mouth daily.    [provider]  clopidogrel  (PLAVIX ) 75 MG tablet Take 1 tablet (75 mg total) by mouth daily. 12/26/10   Wall, Craige Dixon, MD  DENTA 5000 PLUS 1.1 % CREA dental cream Place 1 application onto teeth at bedtime.  06/12/16   [provider]  diclofenac Sodium (VOLTAREN) 1 % GEL Apply 4 g topically 3 (three) times daily as needed (pain). 03/27/20   [provider]  diphenhydrAMINE (BENADRYL) 12.5 MG/5ML liquid Take 12.5 mg by mouth 4 (four) times daily as needed for allergies.    [provider]  DULoxetine  (CYMBALTA ) 30 MG capsule TAKE 1 CAPSULE DAILY 08/30/23   Phebe Brasil, MD  DULoxetine  (CYMBALTA ) 60 MG capsule Take 1 capsule (60 mg total) by mouth at bedtime. 09/14/22   Phebe Brasil, MD  gabapentin (NEURONTIN) 600 MG tablet Take 600 mg by mouth 2 (two) times daily.    [provider]  guaiFENesin (MUCINEX) 600 MG 12 hr tablet Take 600 mg by mouth 2 (two) times daily as needed for cough or to  loosen phlegm.    [provider]  hydrocortisone  (ANUSOL -HC) 2.5 % rectal cream Place 1 Application rectally 2 (two) times daily. For rectal itching, burning, bleeding 05/25/23   Delman Ferns, NP  ibandronate (BONIVA) 150 MG tablet Take 150 mg by mouth every 30 (thirty) days. Take in the morning with a full glass of water, on an empty stomach, and do not take anything else by mouth or lie down for the next 30 min.    [provider]  levothyroxine (SYNTHROID, LEVOTHROID) 75 MCG tablet Take 75 mcg by mouth daily before breakfast.    [provider]  lubiprostone  (AMITIZA ) 24 MCG capsule Take 1 capsule (24 mcg total) by mouth 2 (two) times daily with a meal. 05/25/23   Delman Ferns, NP  NUCYNTA 50 MG tablet Take 50 mg by mouth in the morning, at noon, and at bedtime. 05/10/18   [provider]  NUCYNTA ER 50 MG 12 hr tablet Take 50 mg  by mouth every 12 (twelve) hours.  04/30/16   [provider]  pantoprazole (PROTONIX) 40 MG tablet Take 40 mg by mouth daily as needed (for acid reflux).    [provider]  ranolazine  (RANEXA ) 500 MG 12 hr tablet TAKE 1 TABLET DAILY 04/20/23   Maudine Sos, MD  rOPINIRole (REQUIP) 2 MG tablet Take 2 mg by mouth at bedtime.    [provider]  sennosides-docusate sodium (SENOKOT-S) 8.6-50 MG tablet Take 2 tablets by mouth daily. As needed    [provider]  Study - ORION 4 - inclisiran 300 mg/1.59mL or placebo SQ injection (PI-Stuckey) Inject 300 mg into the skin every 6 (six) months. 07/22/18   [provider]    Inpatient Medications: Scheduled Meds:  AbobotulinumtoxinA   1,500 Units Intramuscular Q90 days   AbobotulinumtoxinA   500 Units Intramuscular Q90 days   nitroGLYCERIN   0.4 mg Sublingual Once   ORION 4 inclisiran or placebo  300 mg Subcutaneous Q6 months   Continuous Infusions:  PRN Meds:   Allergies:    Allergies  Allergen Reactions   Morphine Other (See Comments)     Sick on stomach   Rosuvastatin  Other (See Comments)    myalgias   Zolpidem Tartrate Other (See Comments)    Sleep walk   Penicillins Rash and Other (See Comments)         Social History:   Social History   Socioeconomic History   Marital status: Married    Spouse name: Not on file   Number of children: Not on file   Years of education: Not on file   Highest education level: Not on file  Occupational History   Occupation: disabled  Tobacco Use   Smoking status: Former   Smokeless tobacco: Never   Tobacco comments:    Quit in 2005  Vaping Use   Vaping status: Never Used  Substance and Sexual Activity   Alcohol  use: Yes    Comment: social   Drug use: No   Sexual activity: Not Currently  Other Topics Concern   Not on file  Social History Narrative   Married, no children   Right handed   12th grade   1 cup daily   Social Drivers of Corporate investment banker Strain: Not on file  Food Insecurity: Not on file  Transportation Needs: Not on file  Physical Activity: Not on file  Stress: Not on file  Social Connections: Not on file  Intimate Partner Violence: Not on file    Family History:   Family History  Problem Relation Age of Onset   Arrhythmia Father        PPM   Colon cancer Neg Hx    Breast cancer Neg Hx      ROS:  Please see the history of present illness.  All other ROS reviewed and negative.     Physical Exam/Data:   Vitals:   12/11/23 0928 12/11/23 0930 12/11/23 1030 12/11/23 1230  BP: 139/77  124/72 137/86  Pulse: 98  70 74  Resp: 16  18 19   Temp: 98.3 F (36.8 C)     SpO2: 99%  98% 99%  Weight:  63 kg    Height:  5\' 2"  (1.575 m)     No intake or output data in the 24 hours ending 12/11/23 1342    12/11/2023    9:30 AM 10/26/2023    9:27 AM 07/12/2023    1:19 PM  Last 3 Weights  Weight (  lbs) 139 lb 145 lb 142 lb 14.4 oz  Weight (kg) 63.05 kg 65.772 kg 64.819 kg     Body mass index is 25.42 kg/m.  General: Well developed,  well nourished, in no acute distress. Head: Normocephalic, atraumatic, sclera non-icteric, no xanthomas, nares are without discharge. Neck: Negative for carotid bruits. JVP not elevated. Lungs: Clear bilaterally to auscultation without wheezes, rales, or rhonchi. Breathing is unlabored. Heart: RRR S1 S2 without murmurs, rubs, or gallops.  Abdomen: Soft, non-tender, non-distended with normoactive bowel sounds. No rebound/guarding. Extremities: No clubbing or cyanosis. No edema. Distal pedal pulses are 2+ and equal bilaterally. Neuro: Alert and oriented X 3. Moves all extremities spontaneously. Psych:  Responds to questions appropriately with a normal affect.   EKG:  The EKG was personally reviewed and demonstrates:  EKG shows NSR 98bpm, minimal voltage criteria for LVH but otherwise no acute STT changes from prior. Telemetry:  Telemetry was personally reviewed and demonstrates:  NSR  Relevant CV Studies: Nuc 2017  No diagnostic ST segment changes to indicate ischemia. Small, mild intensity, lateral apical region of ischemia in the setting of artifact from extracardiac radiotracer uptake and also arms down imaging. No large ischemic territories. This is a low risk study. Nuclear stress EF: 74%.    Laboratory Data:  High Sensitivity Troponin:   Recent Labs  Lab 12/11/23 0930 12/11/23 1250  TROPONINIHS 3 5     Chemistry Recent Labs  Lab 12/11/23 0930  NA 137  K 4.3  CL 103  CO2 26  GLUCOSE 98  BUN 12  CREATININE 0.71  CALCIUM  9.2  GFRNONAA >60  ANIONGAP 8    No results for input(s): "PROT", "ALBUMIN", "AST", "ALT", "ALKPHOS", "BILITOT" in the last 168 hours. Lipids No results for input(s): "CHOL", "TRIG", "HDL", "LABVLDL", "LDLCALC", "CHOLHDL" in the last 168 hours.  Hematology Recent Labs  Lab 12/11/23 0930  WBC 6.7  RBC 4.50  HGB 14.7  HCT 44.8  MCV 99.6  MCH 32.7  MCHC 32.8  RDW 12.4  PLT 301   Thyroid  No results for input(s): "TSH", "FREET4" in the last  168 hours.  BNP Recent Labs  Lab 12/11/23 1045  BNP 15.6    DDimer No results for input(s): "DDIMER" in the last 168 hours.   Radiology/Studies:  DG Chest 2 View Result Date: 12/11/2023 CLINICAL DATA:  Chest pain EXAM: CHEST - 2 VIEW COMPARISON:  Chest x-ray performed May 26, 2022 FINDINGS: Mild hypoventilatory changes. No focal infiltrate. No pleural effusion or pneumothorax. Heart mediastinum are unchanged. IMPRESSION: No active cardiopulmonary disease. Electronically Signed   By: Reagan Camera M.D.   On: 12/11/2023 10:42      Assessment/Plan   1. Chest pain/SOB with mixed features, history of CAD with MI 2007 with LAD disease managed medically with resolution of CAD by 2008 catheterization - reassuring troponins x2 despite 5 hours of pain today superimposed on a week of waxing/waning symptoms, BNP wnl - EKG nonacute - will order echo; Dr. Carolynne Citron to follow with further recs - anticipate continuing ASA, Plavix , Ranexa  - historically intolerant of statins, previously in [place] study - can f/u outpt for this - recommend medicine consider eval of noncardiac causes as well  2. H/o CVA with chronic L-sided weakness and chronic nerve pain - on Nucynta, Cymbalta , Gabapentin, Xanax at baseline - pain management per primary team - no acute neurologic concerns  3. Mild carotid disease - vasc surgery previously recommended recheck 2027 - no acute concerns  4. Hypothyroidism -  on levothyroxine, home med list pending - add TSH to labs   Risk Assessment/Risk Scores:     TIMI Risk Score for Unstable Angina  The patient's TIMI risk score is 4, which indicates a 20% risk of all cause mortality, new or recurrent myocardial infarction or need for urgent revascularization in the next 14 days.   For questions or updates, please contact [facility] Please consult [email or web address] for contact info under    Signed, Dayna N Dunn, PA-C  12/11/2023 1:42 PM  Cardiology  Attending  Patient seen and examined. H&P very well documented by Dayna Dunn, PA-C. She is a pleasant 73 yo woman with CAD, with a distal 99% LAD with unsuccessful attempted PCI in 2007. She had jaw pain at that time. Over the years she has remained active and over the last week has had recurrent chest pressure. Earlier in the week the symptoms were relieved by belching. She presented this morning with more pain. Serial cardiac markers with HS troponins were 3 and 5. She has no evidence on ECG of acute ischemia. She feels well. Her symptoms have resolved. She had 5 hours of pain earlier in the day. The patient has not taken regular acid suppression, only PRN. On exam she is well appearing and in NAD. Lungs were clear and CV with a RRR and ext warm with no edema and abdomen soft and neuro non-focal. ECG with NSR and borderline LVH. Labs reviewed.  A/P Non-cardiac chest pain - I have recommended she be discharged home. I would like her to take acid suppression daily. Followup with Dr. Theodis Fiscal. She can restart her daily acitivity on Tuesday. Echo could be done as an outpatient though I do not feel strongly about this. Please call for additional questions. She agrees with our plan.  Pete Brand Valerie Fredin,MD

## 2023-12-11 NOTE — ED Triage Notes (Signed)
 Pt arrives via POV from home with intermittent pain in chest described as tightness with radiation between shoulder blades. Pt reports noting more SOB with exercise. Denies cough/fevers. Pt with hx of MI in 2007.

## 2023-12-15 ENCOUNTER — Ambulatory Visit: Payer: Medicare HMO | Admitting: Neurology

## 2023-12-15 VITALS — BP 123/83

## 2023-12-15 DIAGNOSIS — I69354 Hemiplegia and hemiparesis following cerebral infarction affecting left non-dominant side: Secondary | ICD-10-CM | POA: Diagnosis not present

## 2023-12-15 DIAGNOSIS — M792 Neuralgia and neuritis, unspecified: Secondary | ICD-10-CM

## 2023-12-15 DIAGNOSIS — I639 Cerebral infarction, unspecified: Secondary | ICD-10-CM

## 2023-12-15 MED ORDER — ABOBOTULINUMTOXINA 500 UNITS IM SOLR: 1000.0000 [IU] | Freq: Once | INTRAMUSCULAR | Status: DC

## 2023-12-15 NOTE — Progress Notes (Signed)
 Dysport  500units x 2 vial  [id number] Exp-06/02/25 B/B  Bacteriostatic 0.9% Sodium Chloride - 5 mL  Lot: [id number] Expiration: 06/03/24 NDC: 8295621308 Dx:  M57.846  WITNESSED NG:EXBMW cma

## 2023-12-15 NOTE — Progress Notes (Signed)
 [facility]  CC:  Spastic hemiplegia  [name] is a 73  y.o. Right-handed Caucasian female, return for EMG guided Botox  injection for her left spastic hemiparesis,  She suffered stroke in 1999, with residual left spastic hemiparesis, upper extremity more than leg, left visual field cut  She previously was enrolled in Botox  research study for spastic upper extremity at [facility] by Dr. Garold Kail, received EMG guided Botox  injection in 2010 for about 2 years, with good benefit, but has difficulty with insurance  She began to receive Botox  injection through our clinic by Dr. Anell Keep since May 2015,150 units total to left upper extremity, which has been very helpful  She is return for continued EMG guided Botox  injection, at baseline, she can ambulate with a left AFO, but without assistance,  Profound left upper extremity spasticity, achiness, chronic pain, persistent left wrist flexion, finger flexion,  She also has a history of coronary artery disease in 2007, taking Plavix , and Aggrenox, exercise regularly, has a Systems analyst  She began to receive botulism toxin injection for spastic left hemiparesis since November 2015, initially uses only 300 units, for both left upper and lower extremity, later with the exercise on left upper extremity only, Botox  a was changed to Dysport  500 units x 3, per insurance requirement  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, especially left shoulder, elbow, wrist, injection does help her  UPDATE March 23, 2022: She did well with previous injection, helped her shoulder elbow wrist deep achy pain, no significant side effect,  UPDATE Sep 14 2022:  Every 3 months Dysport  injection for left upper extremity spasticity did help her spastic pain some, but she continues to have intermittent whole left side deep achy pain, consistent with post thalamic stroke neuropathic pain, Cymbalta  60 mg has helped her,  increased to 90 mg daily, also benefit her depression  UPDATE Dec 05 2022: Return for electrical summation guided Dysport  injection for spastic left upper extremity, continue help her symptoms, no significant side effect noted,  UPDATE March 16 2023: She did well with previous injection  UPDATE Jun 15 2023: She did well with previous injection  UPDATE Sep 14 2023: Patient did help relax in her left shoulder, better range of motion, can put on deodorant  UPDATE Dec 15 2023: Injection did help her muscle spasm, no significant side effect noticed.  Physical exam  Vitals: BP 123/83  Last Weight:  Wt Readings from Last 1 Encounters:  12/11/23 139 lb (63 kg)   Last Height:   Ht Readings from Last 1 Encounters:  12/11/23 5\' 2"  (1.575 m)   PHYSICAL EXAMINATOINS:    Motor: spastic left hemiparesis,    left lower extremity moderate spasticity, left hip flexion 4, knee flexion 4, extension 5,  Significant left upper extremity spasticity, left shoulder adduction, internal rotation.left elbow flexion, pronation, with passive movement, she has full range of motion of left elbow, left wrist forceful flexion, well-healed left forearm volar surface scar, left hand stayed in Position, finger flexion,thumb in position, ,limited range of motion of left finger extension  Gait: Rising up from seated position by pushing on chair arm,spastic left hemi-circumferential gait.  Assessment and plan:   73 year old right-handed Caucasian female, status post stroke, with spastic left hemiparesis since 1999, responded very well to previous EMG guided botulism toxin injection, return to clinic for repeat injection  Under electric stimulation, 500 units of Dysport  A x 2 =1000 units total,   (500 units dysport  was  dissolved into 2.5cc of NS)  Left flexor digitorum profundus  0.5cc  Left palmaris longus 0.5cc Left flexor digitorum superficialis 0.5 cc Left pectoralis major 0.5ccx 4=2.0c Left latissimus  dorsi 0.5 ccx 3= 1.5 cc    [name], M.D. Ph.D.  [facility] [address] [place], [place] [address] Phone: [phone] Fax:      [phone]+

## 2023-12-17 ENCOUNTER — Ambulatory Visit
Admission: RE | Admit: 2023-12-17 | Discharge: 2023-12-17 | Disposition: A | Discharge status: Home / Self Care | Source: Ambulatory Visit | Attending: Family Medicine | Admitting: Family Medicine

## 2023-12-17 DIAGNOSIS — Z1231 Encounter for screening mammogram for malignant neoplasm of breast: Secondary | ICD-10-CM

## 2024-01-08 ENCOUNTER — Other Ambulatory Visit (HOSPITAL_BASED_OUTPATIENT_CLINIC_OR_DEPARTMENT_OTHER): Payer: Self-pay | Admitting: Cardiovascular Disease

## 2024-02-15 ENCOUNTER — Telehealth: Payer: Self-pay | Admitting: Cardiovascular Disease

## 2024-02-15 NOTE — Telephone Encounter (Signed)
 Pt c/o Shortness Of Breath: STAT if SOB developed within the last 24 hours or pt is noticeably SOB on the phone  1. Are you currently SOB (can you hear that pt is SOB on the phone)? No  2. How long have you been experiencing SOB? 1 incident in May And July 7 both times while on treadmill  3. Are you SOB when sitting or when up moving around? Up moving around  4. Are you currently experiencing any other symptoms? No (pt just wanted to make Dr Raford aware)

## 2024-02-16 ENCOUNTER — Ambulatory Visit (HOSPITAL_BASED_OUTPATIENT_CLINIC_OR_DEPARTMENT_OTHER): Payer: Self-pay | Admitting: Cardiovascular Disease

## 2024-02-16 NOTE — Telephone Encounter (Signed)
 See results f/u 7/16, mychart message to patient

## 2024-03-08 ENCOUNTER — Ambulatory Visit: Admitting: Neurology

## 2024-03-08 DIAGNOSIS — I69354 Hemiplegia and hemiparesis following cerebral infarction affecting left non-dominant side: Secondary | ICD-10-CM | POA: Diagnosis not present

## 2024-03-08 DIAGNOSIS — M792 Neuralgia and neuritis, unspecified: Secondary | ICD-10-CM

## 2024-03-08 DIAGNOSIS — I639 Cerebral infarction, unspecified: Secondary | ICD-10-CM

## 2024-03-08 MED ORDER — ABOBOTULINUMTOXINA 500 UNITS IM SOLR
1000.0000 [IU] | Freq: Once | INTRAMUSCULAR | Status: AC
Start: 2024-03-08 — End: 2024-03-11
  Administered 2024-03-11: 1000 [IU] via INTRAMUSCULAR

## 2024-03-08 NOTE — Progress Notes (Signed)
 Dysport  500units x 2 vial  [id number] Exp-09/02/2025 B/B  Bacteriostatic 0.9% Sodium Chloride - 5 mL  Lot: [id number] Expiration: 06/02/25 NDC: 9590803397 Dx: P36.0,  I69.354  M79.2  WITNESSED BY:a jones rn

## 2024-03-08 NOTE — Progress Notes (Signed)
  [facility]  CC:  Spastic hemiplegia  [name] is a 73  y.o. Right-handed Caucasian female, return for EMG guided Botox  injection for her left spastic hemiparesis,  She suffered stroke in 1999, with residual left spastic hemiparesis, upper extremity more than leg, left visual field cut  She previously was enrolled in Botox  research study for spastic upper extremity at [facility] by Dr. Kateri, received EMG guided Botox  injection in 2010 for about 2 years, with good benefit, but has difficulty with insurance  She began to receive Botox  injection through our clinic by Dr. Arlys since May 2015,150 units total to left upper extremity, which has been very helpful  She is return for continued EMG guided Botox  injection, at baseline, she can ambulate with a left AFO, but without assistance,  Profound left upper extremity spasticity, achiness, chronic pain, persistent left wrist flexion, finger flexion,  She also has a history of coronary artery disease in 2007, taking Plavix , and Aggrenox, exercise regularly, has a Systems analyst  She began to receive botulism toxin injection for spastic left hemiparesis since November 2015, initially uses only 300 units, for both left upper and lower extremity, later with the exercise on left upper extremity only, Botox  a was changed to Dysport  500 units x 2, per insurance requirement  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, especially left shoulder, elbow, wrist, injection does help her   Physical exam    Motor: spastic left hemiparesis,    left lower extremity moderate spasticity, left hip flexion 4, knee flexion 4, extension 5,  Significant left upper extremity spasticity, left shoulder adduction, internal rotation.left elbow flexion, pronation, with passive movement, she has full range of motion of left elbow, left wrist forceful flexion, well-healed left forearm volar surface scar, left hand  stayed in Position, finger flexion,thumb in position, ,limited range of motion of left finger extension  Gait: Rising up from seated position by pushing on chair arm,spastic left hemi-circumferential gait.  Assessment and plan:   73 year old right-handed Caucasian female, status post stroke, with spastic left hemiparesis since 1999, responded very well to previous EMG guided botulism toxin injection, return to clinic for repeat injection  Under electric stimulation, 500 units of Dysport  A x 2 =1000 units total,   (500 units dysport  was dissolved into 2.5cc of NS)  Left flexor digitorum profundus  0.5cc  Left palmaris longus 0.5cc Left flexor digitorum superficialis 0.5 cc Left pectoralis major 0.5ccx 4=2.0c Left latissimus dorsi 0.5 ccx 3= 1.5 cc    Modena Callander, M.D. Ph.D.  [facility] [address] [place], [place] [address] Phone: [phone] Fax:      [phone]

## 2024-03-11 DIAGNOSIS — I69354 Hemiplegia and hemiparesis following cerebral infarction affecting left non-dominant side: Secondary | ICD-10-CM | POA: Diagnosis not present

## 2024-04-14 ENCOUNTER — Encounter: Payer: Self-pay | Admitting: Gastroenterology

## 2024-04-25 ENCOUNTER — Encounter

## 2024-05-02 ENCOUNTER — Encounter: Admitting: *Deleted

## 2024-05-02 DIAGNOSIS — Z006 Encounter for examination for normal comparison and control in clinical research program: Secondary | ICD-10-CM

## 2024-05-02 MED ORDER — STUDY - ORION 4 - INCLISIRAN 300 MG/1.5 ML OR PLACEBO SQ INJECTION (PI-STUCKEY): 300.0000 mg | INJECTION | SUBCUTANEOUS | Status: DC

## 2024-05-02 MED ORDER — STUDY - ORION 4 - INCLISIRAN 300 MG/1.5 ML OR PLACEBO SQ INJECTION (PI-STUCKEY)
300.0000 mg | INJECTION | SUBCUTANEOUS | Status: AC
  Administered 2024-05-02: 300 mg via SUBCUTANEOUS
  Filled 2024-05-02: qty 1.5

## 2024-05-02 NOTE — Research (Signed)
 Orion 4 IP given  Patient doing well  No complaints  Went to ER but didn't get admitted  Was told it was reflux  Feeling great  Will see patient back in 6 months 10/24/2024  Suzen Hardy :) RN BSN  Clinical Research Nurse  Be strong and take heart, all you who hope in the Lord. ~ Psalm 31:24

## 2024-05-17 ENCOUNTER — Telehealth: Payer: Self-pay | Admitting: Neurology

## 2024-05-17 DIAGNOSIS — I69354 Hemiplegia and hemiparesis following cerebral infarction affecting left non-dominant side: Secondary | ICD-10-CM

## 2024-05-17 DIAGNOSIS — I639 Cerebral infarction, unspecified: Secondary | ICD-10-CM

## 2024-05-17 NOTE — Telephone Encounter (Signed)
 Submitted auth request via CMM to transition pt to [facility], status is pending. Key: [id number]

## 2024-05-22 MED ORDER — ABOBOTULINUMTOXINA 500 UNITS IM SOLR: 1000.0000 [IU] | INTRAMUSCULAR | 0 refills | Status: DC

## 2024-05-22 NOTE — Addendum Note (Signed)
 Addended by: JOSHUA HERRING L on: 05/22/2024 10:30 AM   Modules accepted: Orders

## 2024-05-22 NOTE — Telephone Encounter (Signed)
 Received approval, please send rx to [facility].  Auth#: [id number] (08/04/23-08/02/25)

## 2024-05-24 NOTE — Telephone Encounter (Signed)
 Called and spoke with pt's husband, he will inform pt to keep an eye out for [facility]'s call.

## 2024-06-08 ENCOUNTER — Ambulatory Visit: Admitting: Neurology

## 2024-06-14 ENCOUNTER — Ambulatory Visit: Admitting: Neurology

## 2024-06-14 VITALS — BP 106/70

## 2024-06-14 DIAGNOSIS — I639 Cerebral infarction, unspecified: Secondary | ICD-10-CM

## 2024-06-14 DIAGNOSIS — I69354 Hemiplegia and hemiparesis following cerebral infarction affecting left non-dominant side: Secondary | ICD-10-CM | POA: Diagnosis not present

## 2024-06-14 DIAGNOSIS — M792 Neuralgia and neuritis, unspecified: Secondary | ICD-10-CM

## 2024-06-14 MED ORDER — ABOBOTULINUMTOXINA 500 UNITS IM SOLR: 1000.0000 [IU] | Freq: Once | INTRAMUSCULAR | Status: DC

## 2024-06-14 NOTE — Progress Notes (Unsigned)
 Dysport  500units x 2 vial  [id number] Exp-09/30/25  S/P Bacteriostatic 0.9% Sodium Chloride - 5 mL  Lot: [id number] Expiration: 06/02/25 NDC: 9590803397 Dx: P30.645, I63.9, M79.2 WITNESSED BY:j webb MA

## 2024-06-14 NOTE — Progress Notes (Unsigned)
  [facility]  CC:  Spastic hemiplegia  [name] is a 74  y.o. Right-handed Caucasian female, return for EMG guided Botox  injection for her left spastic hemiparesis,  She suffered stroke in 1999, with residual left spastic hemiparesis, upper extremity more than leg, left visual field cut  She previously was enrolled in Botox  research study for spastic upper extremity at [facility] by Dr. Kateri, received EMG guided Botox  injection in 2010 for about 2 years, with good benefit, but has difficulty with insurance  She began to receive Botox  injection through our clinic by Dr. Arlys since May 2015,150 units total to left upper extremity, which has been very helpful  She is return for continued EMG guided Botox  injection, at baseline, she can ambulate with a left AFO, but without assistance,  Profound left upper extremity spasticity, achiness, chronic pain, persistent left wrist flexion, finger flexion,  She also has a history of coronary artery disease in 2007, taking Plavix , and Aggrenox, exercise regularly, has a Systems analyst  She began to receive botulism toxin injection for spastic left hemiparesis since November 2015, initially uses only 300 units, for both left upper and lower extremity, later with the exercise on left upper extremity only, Botox  a was changed to Dysport  500 units x 2, per insurance requirement  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, especially left shoulder, elbow, wrist, injection does help her   Physical exam    Motor: spastic left hemiparesis,    left lower extremity moderate spasticity, left hip flexion 4, knee flexion 4, extension 5,  Significant left upper extremity spasticity, left shoulder adduction, internal rotation.left elbow flexion, pronation, with passive movement, she has full range of motion of left elbow, left wrist forceful flexion, well-healed left forearm volar surface scar, left hand  stayed in Position, finger flexion,thumb in position, ,limited range of motion of left finger extension  Gait: Rising up from seated position by pushing on chair arm,spastic left hemi-circumferential gait.  Assessment and plan:   73 year old right-handed Caucasian female, status post stroke, with spastic left hemiparesis since 1999, responded very well to previous EMG guided botulism toxin injection, return to clinic for repeat injection  Under electric stimulation, 500 units of Dysport  A x 2 =1000 units total,   (500 units dysport  was dissolved into 2.5cc of NS)  Left flexor digitorum profundus  0.5cc  Left palmaris longus 0.5cc Left flexor digitorum superficialis 0.5 cc Left pectoralis major 0.5ccx 4=2.0c Left latissimus dorsi 0.5 ccx 3= 1.5 cc    [name], M.D. Ph.D.  [facility] [address] [place], [place] [address] Phone: [phone] Fax:      [phone]

## 2024-07-01 ENCOUNTER — Other Ambulatory Visit: Payer: Self-pay | Admitting: Gastroenterology

## 2024-07-18 ENCOUNTER — Ambulatory Visit: Admitting: Gastroenterology

## 2024-07-26 ENCOUNTER — Ambulatory Visit: Admitting: Gastroenterology

## 2024-07-26 VITALS — BP 130/78 | HR 106 | Temp 98.6°F | Ht 62.0 in | Wt 142.4 lb

## 2024-07-26 DIAGNOSIS — K5903 Drug induced constipation: Secondary | ICD-10-CM | POA: Diagnosis not present

## 2024-07-26 DIAGNOSIS — K219 Gastro-esophageal reflux disease without esophagitis: Secondary | ICD-10-CM | POA: Diagnosis not present

## 2024-07-26 DIAGNOSIS — T402X5A Adverse effect of other opioids, initial encounter: Secondary | ICD-10-CM

## 2024-07-26 MED ORDER — LUBIPROSTONE 24 MCG PO CAPS: 24.0000 ug | ORAL_CAPSULE | Freq: Two times a day (BID) | ORAL | 3 refills | Status: AC

## 2024-07-26 MED ORDER — PANTOPRAZOLE SODIUM 40 MG PO TBEC: 40.0000 mg | DELAYED_RELEASE_TABLET | Freq: Every day | ORAL | 3 refills | Status: AC

## 2024-07-26 NOTE — Patient Instructions (Signed)
 Continue pantoprazole  once a day for now, taking 30 minutes before you eat for best results.  I will see you in about 6 months!  I hope you have a wonderful and special time with family this season.  I enjoyed seeing you again today! I value our relationship and want to provide genuine, compassionate, and quality care. You may receive a survey regarding your visit with me, and I welcome your feedback! Thanks so much for taking the time to complete this. I look forward to seeing you again.      Therisa MICAEL Stager, PhD, ANP-BC [facility]

## 2024-07-26 NOTE — Progress Notes (Signed)
 "      Gastroenterology Office Note     Primary Care Physician:  Dow Longs, PA-C  Primary Gastroenterologist:   Chief Complaint   Chief Complaint  Patient presents with   Follow-up    Follow up on constipation. Pt is doing ok     History of Present Illness   [name] is a 73 y.o. female presenting today with a history of OIC, chronic GERD< last seen in Oct 2024, here for yearly follow-up. Previously failed Linzess and Movantik ; she has found most effective results with Amitiza .   Senokot one to 2 tablets as needed. Amitiza  24 mcg po BID still working well. No rectal bleeding. No abdominal pain.   Started on pantoprazole  taking daily in May 2025 after going to the ED for chest pain. This has improved symptoms significantly. No dysphagia.   Wants to decrease if possible. Would like to hold off on any EGD but willing to do this in the future.   Going to [place]  in February for a week.   Colonoscopy 2019: normal TI, significant looping of colon, external hemorrhoids  Past Medical History:  Diagnosis Date   AMI (acute myocardial infarction) (HCC)    a. 2007 - failed PCI to the distal LAD - managed medically. b. relook cath 2008 - no significant CAD; previous LAD stenosis resolved/recanalized.   Chronic pain    Complication of anesthesia    CVA (cerebral vascular accident) (HCC) 1999   Degenerative disc disease, cervical    Hypothyroidism    PONV (postoperative nausea and vomiting)    Unstable angina (HCC)     Past Surgical History:  Procedure Laterality Date   COLONOSCOPY WITH PROPOFOL  N/A 05/24/2018   normal TI, significant looping of the colon, and external hemorrhoids.   ENDOMETRIAL ABLATION  3/06   left ankle surgery     OPEN REDUCTION INTERNAL FIXATION (ORIF) DISTAL RADIAL FRACTURE Left 07/23/2020   Procedure: 1.  Removal left distal radius volar plate 2.  Open reduction internal fixation left extra-articular radial shaft fracture 3.  Closed treatment  distal ulnar shaft fracture ;  Surgeon: Murrell Drivers, MD;  Location: [facility];  Service: Orthopedics;  Laterality: Left;    Current Outpatient Medications  Medication Sig Dispense Refill   AbobotulinumtoxinA  (DYSPORT ) 500 units SOLR injection Inject 1,000 Units into the muscle every 3 (three) months. 2 each 0   ALPRAZolam (XANAX) 0.5 MG tablet Take 0.5 mg by mouth at bedtime.     aspirin  81 MG tablet Take 81 mg by mouth daily.     cetirizine (ZYRTEC) 10 MG tablet Take 10 mg by mouth daily as needed for allergies.     Cholecalciferol (VITAMIN D3) 1000 units CAPS Take 1,000 Units by mouth daily.     clopidogrel  (PLAVIX ) 75 MG tablet Take 1 tablet (75 mg total) by mouth daily. 90 tablet 3   DENTA 5000 PLUS 1.1 % CREA dental cream Place 1 application onto teeth at bedtime.   4   diclofenac Sodium (VOLTAREN) 1 % GEL Apply 4 g topically 3 (three) times daily as needed (pain).     diphenhydrAMINE (BENADRYL) 12.5 MG/5ML liquid Take 12.5 mg by mouth 4 (four) times daily as needed for allergies.     DULoxetine  (CYMBALTA ) 30 MG capsule TAKE 1 CAPSULE DAILY 90 capsule 3   DULoxetine  (CYMBALTA ) 60 MG capsule Take 1 capsule (60 mg total) by mouth at bedtime. 90 capsule 3   gabapentin (NEURONTIN) 600 MG tablet Take 600 mg  by mouth 2 (two) times daily.     guaiFENesin (MUCINEX) 600 MG 12 hr tablet Take 600 mg by mouth 2 (two) times daily as needed for cough or to loosen phlegm.     hydrocortisone  (ANUSOL -HC) 2.5 % rectal cream Place 1 Application rectally 2 (two) times daily. For rectal itching, burning, bleeding 30 g 1   ibandronate (BONIVA) 150 MG tablet Take 150 mg by mouth every 30 (thirty) days. Take in the morning with a full glass of water, on an empty stomach, and do not take anything else by mouth or lie down for the next 30 min.     levothyroxine (SYNTHROID, LEVOTHROID) 75 MCG tablet Take 75 mcg by mouth daily before breakfast.     NUCYNTA 50 MG tablet Take 50 mg by mouth in the morning, at noon, and at  bedtime.  0   NUCYNTA ER 50 MG 12 hr tablet Take 50 mg by mouth every 12 (twelve) hours.   0   ranolazine  (RANEXA ) 500 MG 12 hr tablet TAKE 1 TABLET DAILY 90 tablet 2   rOPINIRole (REQUIP) 2 MG tablet Take 2 mg by mouth at bedtime.     sennosides-docusate sodium  (SENOKOT-S) 8.6-50 MG tablet Take 2 tablets by mouth daily. As needed     Study - ORION 4 - inclisiran 300 mg/1.54mL or placebo SQ injection (PI-Stuckey) Inject 300 mg into the skin every 6 (six) months.     lubiprostone  (AMITIZA ) 24 MCG capsule Take 1 capsule (24 mcg total) by mouth 2 (two) times daily with a meal. 180 capsule 3   pantoprazole  (PROTONIX ) 40 MG tablet Take 1 tablet (40 mg total) by mouth daily. 90 tablet 3   Current Facility-Administered Medications  Medication Dose Route Frequency Provider Last Rate Last Admin   AbobotulinumtoxinA  (DYSPORT ) 500 units injection 1,000 Units  1,000 Units Intramuscular Once Onita Duos, MD       AbobotulinumtoxinA  (DYSPORT ) 500 units injection 1,000 Units  1,000 Units Intramuscular Once Onita Duos, MD       AbobotulinumtoxinA  (DYSPORT ) 500 units injection 1,500 Units  1,500 Units Intramuscular Q90 days Onita Duos, MD   1,500 Units at 03/16/23 1348   Study - ORION 4 - inclisiran 300 mg/1.54mL or placebo SQ injection (PI-Stuckey)  300 mg Subcutaneous Q6 months    300 mg at 05/02/24 1344    Allergies as of 07/26/2024 - Review Complete 07/26/2024  Allergen Reaction Noted   Morphine Other (See Comments) 01/01/2009   Penicillins Rash and Other (See Comments) 01/01/2009   Rosuvastatin  Other (See Comments) 01/28/2021   Zolpidem tartrate Other (See Comments) 01/01/2009    Family History  Problem Relation Age of Onset   Arrhythmia Father        PPM   Colon cancer Neg Hx    Breast cancer Neg Hx     Social History   Socioeconomic History   Marital status: Married    Spouse name: Not on file   Number of children: Not on file   Years of education: Not on file   Highest education level:  Not on file  Occupational History   Occupation: disabled  Tobacco Use   Smoking status: Former   Smokeless tobacco: Never   Tobacco comments:    Quit in 2005  Vaping Use   Vaping status: Never Used  Substance and Sexual Activity   Alcohol  use: Yes    Comment: social   Drug use: No   Sexual activity: Not Currently  Other Topics Concern  Not on file  Social History Narrative   Married, no children   Right handed   12th grade   1 cup daily   Social Drivers of Health   Tobacco Use: Medium Risk (12/11/2023)   Patient History    Smoking Tobacco Use: Former    Smokeless Tobacco Use: Never    Passive Exposure: Not on Actuary Strain: Not on file  Food Insecurity: Not on file  Transportation Needs: Not on file  Physical Activity: Not on file  Stress: Not on file  Social Connections: Not on file  Intimate Partner Violence: Not on file  Depression (EYV7-0): Not on file  Alcohol  Screen: Not on file  Housing: Not on file  Utilities: Not on file  Health Literacy: Not on file     Review of Systems   Gen: Denies any fever, chills, fatigue, weight loss, lack of appetite.  CV: Denies chest pain, heart palpitations, peripheral edema, syncope.  Resp: Denies shortness of breath at rest or with exertion. Denies wheezing or cough.  GI: Denies dysphagia or odynophagia. Denies jaundice, hematemesis, fecal incontinence. GU : Denies urinary burning, urinary frequency, urinary hesitancy MS: Denies joint pain, muscle weakness, cramps, or limitation of movement.  Derm: Denies rash, itching, dry skin Psych: Denies depression, anxiety, memory loss, and confusion Heme: Denies bruising, bleeding, and enlarged lymph nodes.   Physical Exam   BP 130/78   Pulse (!) 106   Temp 98.6 F (37 C)   Ht 5' 2 (1.575 m)   Wt 142 lb 6.4 oz (64.6 kg)   BMI 26.05 kg/m  General:   Alert and oriented. Pleasant and cooperative. Well-nourished and well-developed.  Head:  Normocephalic  and atraumatic. Eyes:  Without icterus Abdomen:  +BS, soft, non-tender and non-distended. No HSM noted. No guarding or rebound. No masses appreciated.  Rectal:  Deferred  Msk:  Symmetrical without gross deformities. Normal posture. Extremities:  Without edema. Neurologic:  Alert and  oriented x4;  grossly normal neurologically. Skin:  Intact without significant lesions or rashes. Psych:  Alert and cooperative. Normal mood and affect.   Assessment   OIC  GERD exacerbation   PLAN   Doing well overall with Amitiza  24 mcg po BID and added stool softeners OTC as needed, which we will continue  Will continue PPI daily, as was previously taken prn. Suspected GERD flare earlier this year. As no prior EGD and chronic GERD, recommend screening EGD when schedule allows. She would like to postpone this until next visit.  Return in 6 months   Therisa MICAEL Stager, PhD, [facility]    "

## 2024-08-04 ENCOUNTER — Other Ambulatory Visit: Payer: Self-pay | Admitting: Neurology

## 2024-08-04 DIAGNOSIS — I69354 Hemiplegia and hemiparesis following cerebral infarction affecting left non-dominant side: Secondary | ICD-10-CM

## 2024-08-04 DIAGNOSIS — I639 Cerebral infarction, unspecified: Secondary | ICD-10-CM

## 2024-08-07 NOTE — Telephone Encounter (Signed)
 Last seen on 06/14/24 Follow up scheduled 09/06/24

## 2024-08-16 ENCOUNTER — Other Ambulatory Visit: Payer: Self-pay | Admitting: Neurology

## 2024-08-17 ENCOUNTER — Telehealth: Payer: Self-pay

## 2024-08-17 NOTE — Telephone Encounter (Signed)
 PA done on Cover My Meds for Lubioprostone 24MCG. Dx used: K59.03 (Drug induced constipation). Tried and Failed: Linzess and Movantik . Waiting on a response.

## 2024-08-17 NOTE — Telephone Encounter (Signed)
 Last seen on 06/14/24 Follow up scheduled on 09/06/24   Dispensed Days Supply Quantity Provider Pharmacy  DULOXETINE  60MG  DR  CAP 08/07/2024 90 90 each Dow Longs, PA-C [facility]  DULOXETINE  30MG  DR  CAP 05/09/2024 90 90 each Onita Duos, MD [facility]      I don't see it mentioned that patient is to continue Rx?  Rx pending to be signed

## 2024-08-18 NOTE — Telephone Encounter (Signed)
 Pt has been approved for Lubiprostone  Cap from 08/03/2024 through 08/02/2025. This will be scanned to the pt's chart in media and the pt will be notified of this decision.

## 2024-09-06 ENCOUNTER — Ambulatory Visit: Admitting: Neurology

## 2024-09-06 ENCOUNTER — Encounter: Payer: Self-pay | Admitting: Neurology

## 2024-09-06 VITALS — BP 145/96 | HR 100

## 2024-09-06 DIAGNOSIS — I69354 Hemiplegia and hemiparesis following cerebral infarction affecting left non-dominant side: Secondary | ICD-10-CM

## 2024-09-06 MED ORDER — ABOBOTULINUMTOXINA 500 UNITS IM SOLR
500.0000 [IU] | Freq: Once | INTRAMUSCULAR | Status: AC
  Administered 2024-09-06: 500 [IU] via INTRAMUSCULAR

## 2024-09-06 NOTE — Progress Notes (Signed)
 Dysport  500units x 2 vial  [phone] [id number] Exp-12/31/2025 B/B or S/P Bacteriostatic 0.9% Sodium Chloride - 5 mL  Lot: [id number] Expiration: 06/02/2025 NDC: 9590-8033-97 Dx: P30.645, 163.9, M79.2 WITNESSED AB:Yjozphy Oneita KRAFT

## 2024-09-06 NOTE — Progress Notes (Signed)
" °  [facility]  CC:  Spastic hemiplegia  [name] is a 75  y.o. Right-handed Caucasian female, return for EMG guided Botox  injection for her left spastic hemiparesis,  She suffered stroke in 1999, with residual left spastic hemiparesis, upper extremity more than leg, left visual field cut  She previously was enrolled in Botox  research study for spastic upper extremity at [facility] by Dr. Kateri, received EMG guided Botox  injection in 2010 for about 2 years, with good benefit, but has difficulty with insurance  She began to receive Botox  injection through our clinic by Dr. Arlys since May 2015,150 units total to left upper extremity, which has been very helpful  She is return for continued EMG guided Botox  injection, at baseline, she can ambulate with a left AFO, but without assistance,  Profound left upper extremity spasticity, achiness, chronic pain, persistent left wrist flexion, finger flexion,  She also has a history of coronary artery disease in 2007, taking Plavix , and Aggrenox, exercise regularly, has a systems analyst  She began to receive botulism toxin injection for spastic left hemiparesis since November 2015, initially uses only 300 units, for both left upper and lower extremity, later with the exercise on left upper extremity only, Botox  a was changed to Dysport  500 units x 2, per insurance requirement  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, especially left shoulder, elbow, wrist, injection does help her   Physical exam    Motor: spastic left hemiparesis,    left lower extremity moderate spasticity, left hip flexion 4, knee flexion 4, extension 5,  Significant left upper extremity spasticity, left shoulder adduction, internal rotation.left elbow flexion, pronation, with passive movement, she has full range of motion of left elbow, left wrist forceful flexion, well-healed left forearm volar surface scar, left hand  stayed in Position, finger flexion,thumb in position, ,limited range of motion of left finger extension  Gait: Rising up from seated position by pushing on chair arm,spastic left hemi-circumferential gait.  Assessment and plan:   74 year old right-handed Caucasian female, status post stroke, with spastic left hemiparesis since 1999, responded very well to previous EMG guided botulism toxin injection, return to clinic for repeat injection  Under electric stimulation, 500 units of Dysport  A x 2 =1000 units total,   (500 units dysport  was dissolved into 2.5cc of NS)  Left flexor digitorum profundus  0.5cc  Left palmaris longus 0.5cc Left flexor digitorum superficialis 0.5 cc Left pectoralis major 0.5ccx 4=2.0c Left latissimus dorsi 0.5 cc   Left lumbricals 0.5 cc divided into 3 injection sites Left brachialis 0.5 cc    [name], M.D. Ph.D.  [facility] [address] [place], [place] [address] Phone: [phone] Fax:      [phone]+ "

## 2024-10-24 ENCOUNTER — Encounter

## 2024-11-29 ENCOUNTER — Ambulatory Visit: Admitting: Neurology

## 2025-01-30 ENCOUNTER — Ambulatory Visit: Admitting: Gastroenterology
